# Patient Record
Sex: Male | Born: 1945 | Race: White | Hispanic: No | Marital: Single | Smoking: Current some day smoker
Health system: Southern US, Community
[De-identification: ages and names within clinical notes are randomized; demographics above are authoritative.]

## PROBLEM LIST (undated history)

## (undated) DIAGNOSIS — I10 Essential (primary) hypertension: Secondary | ICD-10-CM

---

## 1997-08-23 ENCOUNTER — Ambulatory Visit (HOSPITAL_COMMUNITY): Admission: RE | Admit: 1997-08-23 | Discharge: 1997-08-23 | Payer: Self-pay | Admitting: Family Medicine

## 1997-09-06 ENCOUNTER — Ambulatory Visit (HOSPITAL_COMMUNITY): Admission: RE | Admit: 1997-09-06 | Discharge: 1997-09-06 | Payer: Self-pay | Admitting: Family Medicine

## 1997-09-28 ENCOUNTER — Encounter: Admission: RE | Admit: 1997-09-28 | Discharge: 1997-09-28 | Payer: Self-pay | Admitting: Internal Medicine

## 2000-08-13 ENCOUNTER — Encounter: Payer: Self-pay | Admitting: Family Medicine

## 2000-08-13 ENCOUNTER — Encounter: Admission: RE | Admit: 2000-08-13 | Discharge: 2000-08-13 | Payer: Self-pay | Admitting: Family Medicine

## 2015-02-09 ENCOUNTER — Emergency Department
Admission: EM | Admit: 2015-02-09 | Discharge: 2015-02-09 | Disposition: A | Discharge status: Home / Self Care | Payer: Medicare Other | Attending: Emergency Medicine | Admitting: Emergency Medicine

## 2015-02-09 ENCOUNTER — Encounter: Payer: Self-pay | Admitting: Emergency Medicine

## 2015-02-09 ENCOUNTER — Emergency Department: Payer: Medicare Other

## 2015-02-09 DIAGNOSIS — Y998 Other external cause status: Secondary | ICD-10-CM | POA: Diagnosis not present

## 2015-02-09 DIAGNOSIS — I10 Essential (primary) hypertension: Secondary | ICD-10-CM | POA: Insufficient documentation

## 2015-02-09 DIAGNOSIS — S42301A Unspecified fracture of shaft of humerus, right arm, initial encounter for closed fracture: Secondary | ICD-10-CM

## 2015-02-09 DIAGNOSIS — S42291A Other displaced fracture of upper end of right humerus, initial encounter for closed fracture: Secondary | ICD-10-CM | POA: Diagnosis not present

## 2015-02-09 DIAGNOSIS — Y9289 Other specified places as the place of occurrence of the external cause: Secondary | ICD-10-CM | POA: Diagnosis not present

## 2015-02-09 DIAGNOSIS — Y9389 Activity, other specified: Secondary | ICD-10-CM | POA: Diagnosis not present

## 2015-02-09 DIAGNOSIS — S29001A Unspecified injury of muscle and tendon of front wall of thorax, initial encounter: Secondary | ICD-10-CM | POA: Insufficient documentation

## 2015-02-09 DIAGNOSIS — W010XXA Fall on same level from slipping, tripping and stumbling without subsequent striking against object, initial encounter: Secondary | ICD-10-CM | POA: Diagnosis not present

## 2015-02-09 DIAGNOSIS — F17200 Nicotine dependence, unspecified, uncomplicated: Secondary | ICD-10-CM | POA: Diagnosis not present

## 2015-02-09 DIAGNOSIS — S4991XA Unspecified injury of right shoulder and upper arm, initial encounter: Secondary | ICD-10-CM | POA: Diagnosis present

## 2015-02-09 DIAGNOSIS — W19XXXA Unspecified fall, initial encounter: Secondary | ICD-10-CM

## 2015-02-09 HISTORY — DX: Essential (primary) hypertension: I10

## 2015-02-09 IMAGING — CR DG SHOULDER 2+V*R*
1 series · 3 of 3 positions shown · non-contrast
Comparison: None.

CLINICAL DATA: Tripped and fell on right shoulder. Initial
encounter.

EXAM:
RIGHT SHOULDER - 2+ VIEW

[Series 1: w shoulder y-view right · 0.14mm/px · 3 of 3 slices shown]
[im 1/3]
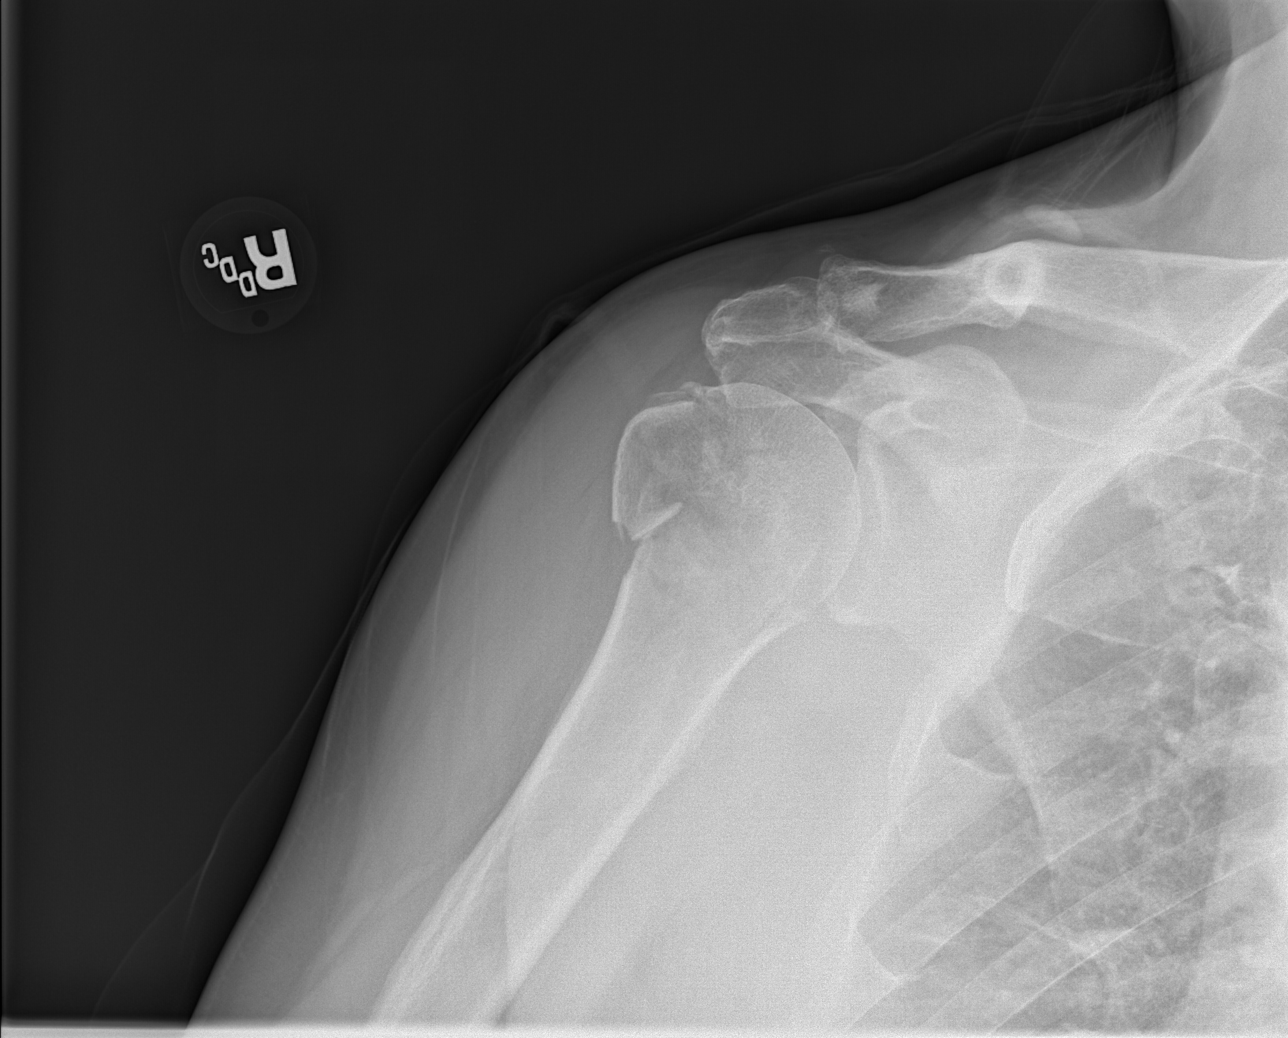
[im 2/3]
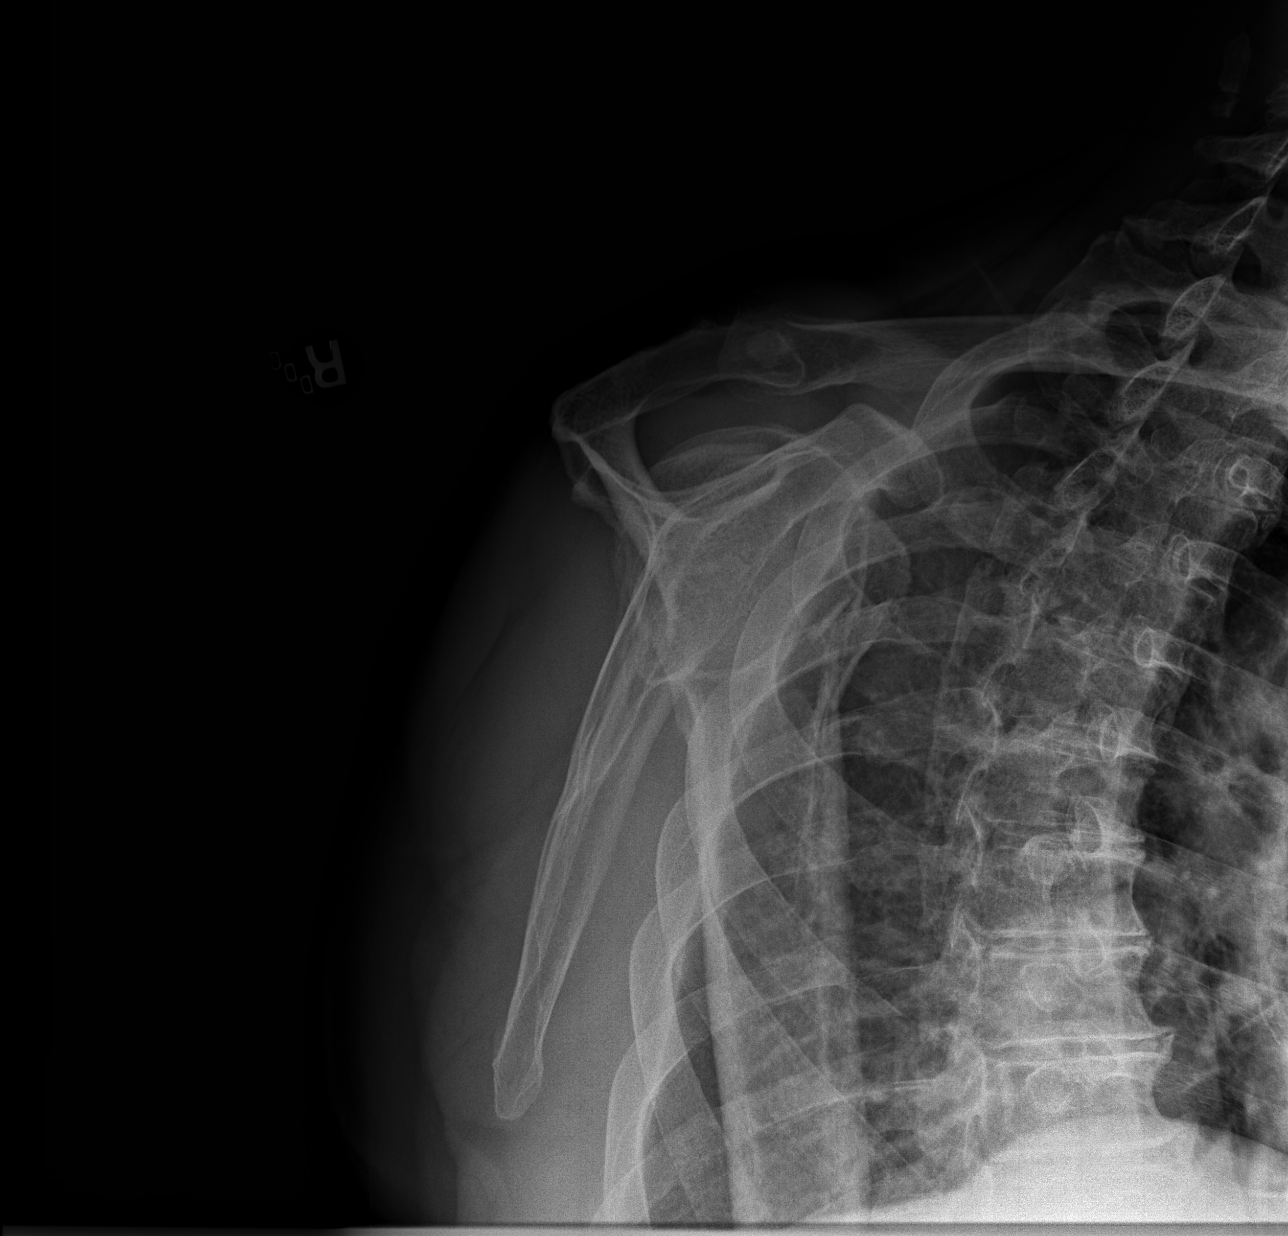
[im 3/3]
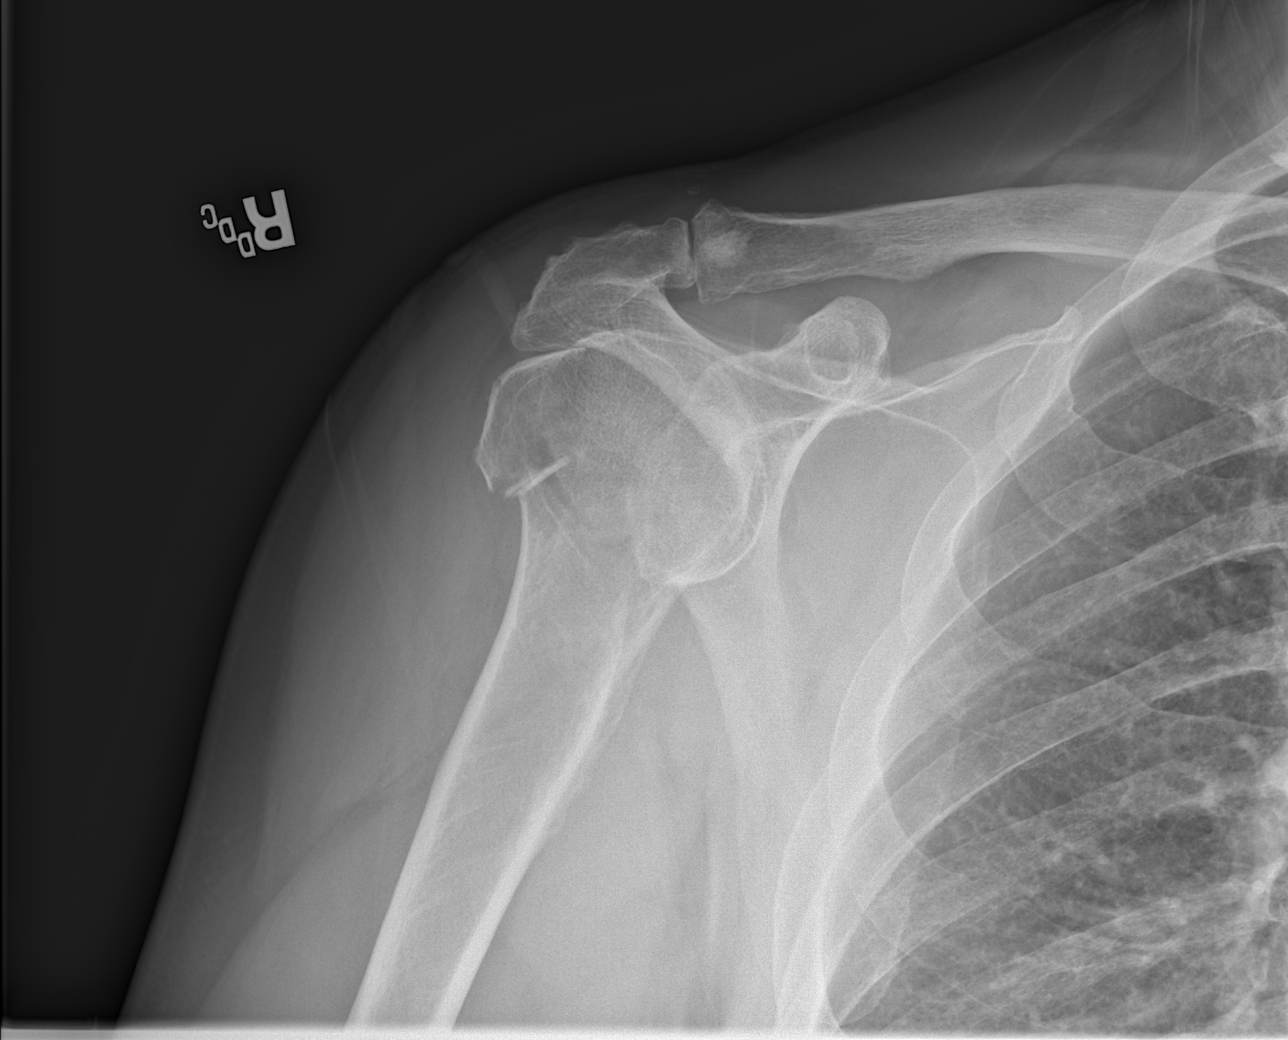

[3 of 3 positions shown; findings below may reference images not displayed]

FINDINGS: There is a comminuted, minimally displaced fracture of the proximal
humerus involving the greater tuberosity and surgical neck. The
humeral head remains approximated with the glenoid. Moderate
acromioclavicular joint osteoarthrosis is noted. No lytic or blastic
osseous lesion or focal soft tissue abnormality is seen.
IMPRESSION: Minimally displaced comminuted fracture of the proximal right
humerus.

## 2015-02-09 MED ORDER — OXYCODONE HCL 5 MG PO TABS: 5.0000 mg | ORAL_TABLET | ORAL | Status: DC | PRN

## 2015-02-09 NOTE — ED Notes (Signed)
Pt states tripped  And fell on his right shoulder with all of his body weight. Cms intact.

## 2015-02-09 NOTE — Discharge Instructions (Signed)
We looked at the x-ray together. Your fracture of part of the bone in your shoulder. Follow-up with orthopedics for reevaluation and ongoing care. Keep the sling in place area did take Tylenol for pain control. If you need stronger medicine, take the oxycodone that has been prescribed, one every 4 hours. Return the emergency department if you have worsening chest pain, shortness of breath, or a few loose sensation or strength in your right hand, or if you have other urgent concerns.  Humerus Fracture Treated With Immobilization The humerus is the large bone in the upper arm. A broken (fractured) humerus is often treated by wearing a cast, splint, or sling (immobilization). This holds the broken pieces in place so they can heal.  HOME CARE  Put ice on the injured area.  Put ice in a plastic bag.  Place a towel between your skin and the bag.  Leave the ice on for 15-20 minutes, 03-04 times a day.  If you are given a cast:  Do not scratch the skin under the cast.  Check the skin around the cast every day. You may put lotion on any red or sore areas.  Keep the cast dry and clean.  If you are given a splint:  Wear the splint as told.  Keep the splint clean and dry.  Loosen the elastic around the splint if your fingers become numb, cold, tingle, or turn blue.  If you are given a sling:  Wear the sling as told.  Do not put pressure on any part of the cast or splint until it is fully hardened.  The cast or splint must be protected with a plastic bag during bathing. Do not lower the cast or splint into water.  Only take medicine as told by your doctor.  Do exercises as told by your doctor.  Follow up as told by your doctor. GET HELP RIGHT AWAY IF:   Your skin or fingernails turn blue or gray.  Your arm feels cold or numb.  You have very bad pain in the injured arm.  You are having problems with the medicines you were given. MAKE SURE YOU:   Understand these  instructions.  Will watch your condition.  Will get help right away if you are not doing well or get worse.   This information is not intended to replace advice given to you by your health care provider. Make sure you discuss any questions you have with your health care provider.   Document Released: 07/11/2007 Document Revised: 02/12/2014 Document Reviewed: 06/16/2014 Elsevier Interactive Patient Education [employer]2016 Elsevier Inc.

## 2015-02-09 NOTE — ED Provider Notes (Signed)
[facility] Emergency Department Provider Note  ____________________________________________  Time seen: 1635  I have reviewed the triage vital signs and the nursing notes.  History by:  Patient  HISTORY  Chief Complaint Shoulder Injury  fall    HPI Warren Leblanc is a 70 y.o. male who was at a gas station, filling his deep with gas. He reports he stepped over the gas hose and tripped. He fell forward, rotating, landing on his right shoulder.  He presents emergency Department with ongoing pain to the right shoulder. He also reports pain in the right lateral chest. He denies any shortness of breath. He denies any other chest pain. He reports increased pain with his shoulder when he attempts to raise the arm.  After triage evaluation, an x-ray was ordered. I have reviewed this x-ray and it shows a fracture to the humeral head.   Past Medical History  Diagnosis Date  . Hypertension     There are no active problems to display for this patient.   History reviewed. No pertinent past surgical history.  Current Outpatient Rx  Name  Route  Sig  Dispense  Refill  . oxyCODONE (OXY IR/ROXICODONE) 5 MG immediate release tablet   Oral   Take 1 tablet (5 mg total) by mouth every 4 (four) hours as needed for severe pain.   10 tablet   0     Allergies Review of patient's allergies indicates no known allergies.  No family history on file.  Social History Social History  Substance Use Topics  . Smoking status: Current Some Day Smoker  . Smokeless tobacco: None  . Alcohol Use: No    Review of Systems  Constitutional: Negative for fever/chills. ENT: Negative for congestion. Cardiovascular: Negative for chest pain. Respiratory: Negative for cough. Gastrointestinal: Negative for abdominal pain, vomiting and diarrhea. Genitourinary: Negative for dysuria. Musculoskeletal: Pain to right shoulder. See history of present illness. Skin: Negative for  rash. Neurological: Negative for headache or focal weakness   10-point ROS otherwise negative.  ____________________________________________   PHYSICAL EXAM:  VITAL SIGNS: ED Triage Vitals  Enc Vitals Group     BP 02/09/15 1436 132/79 mmHg     Pulse Rate 02/09/15 1436 78     Resp 02/09/15 1436 20     Temp 02/09/15 1436 98.2 F (36.8 C)     Temp Source 02/09/15 1436 Oral     SpO2 02/09/15 1436 97 %     Weight 02/09/15 1435 258 lb (117.028 kg)     Height 02/09/15 1435 6' (1.829 m)     Head Cir --      Peak Flow --      Pain Score 02/09/15 1435 10     Pain Loc --      Pain Edu? --      Excl. in GC? --     Constitutional:  Alert and oriented. Well appearing and in no distress. ENT   Head: Normocephalic and atraumatic.   Cervical: Nontender. Normal range of motion Cardiovascular: Normal rate, regular rhythm, no murmur noted Chest wall : Minimal tenderness to the right lateral chest. No defect. Respiratory:  Normal respiratory effort, no tachypnea.    Breath sounds are clear and equal bilaterally.  Gastrointestinal: Soft, no distention. Nontender Back: No muscle spasm, no tenderness, no CVA tenderness. Musculoskeletal: Tenderness to the right shoulder with decreased range of motion. No obvious deformity noted. Neurologic:  Communicative. Good motor function and sensation distal in his right arm. Skin:  Skin is warm, dry. No rash noted. Psychiatric: Mood and affect are normal. Speech and behavior are normal.  ____________________________________________   RADIOLOGY  Right shoulder:  FINDINGS: There is a comminuted, minimally displaced fracture of the proximal humerus involving the greater tuberosity and surgical neck. The humeral head remains approximated with the glenoid. Moderate acromioclavicular joint osteoarthrosis is noted. No lytic or blastic osseous lesion or focal soft tissue abnormality is seen.  IMPRESSION: Minimally displaced comminuted fracture of  the proximal right humerus.   ____________________________________________   PROCEDURES    ____________________________________________   INITIAL IMPRESSION / ASSESSMENT AND PLAN / ED COURSE  Pertinent labs & imaging results that were available during my care of the patient were reviewed by me and considered in my medical decision making (see chart for details).  C7 neural male who fell, landing on his right shoulder, and sustained a fracture to the proximal humerus/humeral head. This is mildly comminuted. It does not cut across the shaft of the bone. We have placed a sling on the patient. We have offered him pain medication but he is declining this. He will accept a prescription for oxycodone to be used at home if needed. He will also use acetaminophen as needed. I've counseled him to follow-up with orthopedics (Dr. Joice Lofts on call).  ____________________________________________   FINAL CLINICAL IMPRESSION(S) / ED DIAGNOSES  Final diagnoses:  Fracture of right humerus, closed, initial encounter      Darien Ramus, MD 02/09/15 1700

## 2016-02-17 ENCOUNTER — Encounter (INDEPENDENT_AMBULATORY_CARE_PROVIDER_SITE_OTHER): Payer: Self-pay | Admitting: Vascular Surgery

## 2016-02-17 ENCOUNTER — Ambulatory Visit (INDEPENDENT_AMBULATORY_CARE_PROVIDER_SITE_OTHER): Payer: Medicare Other | Admitting: Vascular Surgery

## 2016-02-17 VITALS — BP 134/80 | HR 69 | Resp 16 | Ht 72.0 in | Wt 283.0 lb

## 2016-02-17 DIAGNOSIS — I890 Lymphedema, not elsewhere classified: Secondary | ICD-10-CM

## 2016-02-17 DIAGNOSIS — M7989 Other specified soft tissue disorders: Secondary | ICD-10-CM

## 2016-02-17 DIAGNOSIS — I10 Essential (primary) hypertension: Secondary | ICD-10-CM | POA: Diagnosis not present

## 2016-02-17 NOTE — Patient Instructions (Signed)
Lymphedema Introduction Lymphedema is swelling that is caused by the abnormal collection of lymph under the skin. Lymph is fluid from the tissues in your body that travels in the lymphatic system. This system is part of the immune system and includes lymph nodes and lymph vessels. The lymph vessels collect and carry the excess fluid, fats, proteins, and wastes from the tissues of the body to the bloodstream. This system also works to clean and remove bacteria and waste products from the body. Lymphedema occurs when the lymphatic system is blocked. When the lymph vessels or lymph nodes are blocked or damaged, lymph does not drain properly, causing an abnormal buildup of lymph. This leads to swelling in the arms or legs. Lymphedema cannot be cured by medicines, but various methods can be used to help reduce the swelling. What are the causes? There are two types of lymphedema. Primary lymphedema is caused by the absence or abnormality of the lymph vessel at birth. Secondary lymphedema is more common. It occurs when the lymph vessel is damaged or blocked. Common causes of lymph vessel blockage include:  Skin infection, such as cellulitis.  Infection by parasites (filariasis).  Injury.  Cancer.  Radiation therapy.  Formation of scar tissue.  Surgery. What are the signs or symptoms? Symptoms of this condition include:  Swelling of the arm or leg.  A heavy or tight feeling in the arm or leg.  Swelling of the feet, toes, or fingers. Shoes or rings may fit more tightly than before.  Redness of the skin over the affected area.  Limited movement of the affected limb.  Sensitivity to touch or discomfort in the affected limb. How is this diagnosed? This condition may be diagnosed with:  A physical exam.  Medical history.  Bioimpedance spectroscopy. In this test, painless electrical currents are used to measure fluid levels in your body.  Imaging tests, such as:  Lymphoscintigraphy. In  this test, a low dose of a radioactive substance is injected to trace the flow of lymph through the lymph vessels.  MRI.  CT scan.  Duplex ultrasound. This test uses sound waves to produce images of the vessels and the blood flow on a screen.  Lymphangiography. In this test, a contrast dye is injected into the lymph vessel to help show blockages. How is this treated? Treatment for this condition may depend on the cause. Treatment may include:  Exercise. Certain exercises can help fluid move out of the affected limb.  Massage. Gentle massage of the affected limb can help move the fluid out of the area.  Compression. Various methods may be used to apply pressure to the affected limb in order to reduce the swelling.  Wearing compression stockings or sleeves on the affected limb.  Bandaging the affected limb.  Using an external pump that is attached to a sleeve that alternates between applying pressure and releasing pressure.  Surgery. This is usually only done for severe cases. For example, surgery may be done if you have trouble moving the limb or if the swelling does not get better with other treatments. If an underlying condition is causing the lymphedema, treatment for that condition is needed. For example, antibiotic medicines may be used to treat an infection. Follow these instructions at home: Activities  Exercise regularly as directed by your health care provider.  Do not sit with your legs crossed.  When possible, keep the affected limb raised (elevated) above the level of your heart.  Avoid carrying things with an arm that is   affected by lymphedema.  Remember that the affected area is more likely to become injured or infected.  Take these steps to help prevent infection:  Keep the affected area clean and dry.  Protect your skin from cuts. For example, you should use gloves while cooking or gardening. Do not walk barefoot. If you shave the affected area, use an  electric razor. General instructions  Take medicines only as directed by your health care provider.  Eat a healthy diet that includes a lot of fruits and vegetables.  Do not wear tight clothes, shoes, or jewelry.  Do not use heating pads over the affected area.  Avoid having blood pressure checked on the affected limb.  Keep all follow-up visits as directed by your health care provider. This is important. Contact a health care provider if:  You continue to have swelling in your limb.  You have a fever.  You have a cut that does not heal.  You have redness or pain in the affected area.  You have new swelling in your limb that comes on suddenly.  You develop purplish spots or sores (lesions) on your limb. Get help right away if:  You have a skin rash.  You have chills or sweats.  You have shortness of breath. This information is not intended to replace advice given to you by your health care provider. Make sure you discuss any questions you have with your health care provider. Document Released: 11/19/2006 Document Revised: 09/29/2015 Document Reviewed: 12/30/2013  2017 Elsevier  

## 2016-02-17 NOTE — Assessment & Plan Note (Signed)
blood pressure control important in reducing the progression of atherosclerotic disease. On appropriate oral medications.  

## 2016-02-17 NOTE — Assessment & Plan Note (Signed)
Control was fair. Agree with weight loss and increasing exercise regimen as this should help his leg swelling. Continue compression stockings in the lymphedema pump.

## 2016-02-17 NOTE — Progress Notes (Addendum)
[name] : [id number]  Warren Leblanc is a 71 y.o. (11-09-1945) male who presents with chief complaint of  Chief Complaint  Patient presents with  . Re-evaluation    4-5 month follow up  .  History of Present Illness: Patient returns today in follow up of Lymphedema. He is doing reasonably well but is somewhat disappointed that his thigh swelling really has not gone down much. The lymphedema pump and the continuous use of compression stockings have helped his lower leg swelling but he also feels like his groin is less swollen and less fibrotic. He does the lymphedema pump an hour to 90 minutes daily. He has a plan to increase his exercise regimen and lose weight going forward as well. He does not have any signs of ulceration or infection.  Current Outpatient Prescriptions  Medication Sig Dispense Refill  . Cyanocobalamin (VITAMIN B 12 PO) Take by mouth.    Marland Kitchen lisinopril-hydrochlorothiazide (PRINZIDE,ZESTORETIC) 10-12.5 MG tablet Take 1 tablet by mouth daily.    Marland Kitchen oxyCODONE (OXY IR/ROXICODONE) 5 MG immediate release tablet Take 1 tablet (5 mg total) by mouth every 4 (four) hours as needed for severe pain. 10 tablet 0   No current facility-administered medications for this visit.     Past Medical History:  Diagnosis Date  . Hypertension   Lymphedema  No past surgical history on file.  Social History Social History  Substance Use Topics  . Smoking status: Current Some Day Smoker  . Smokeless tobacco: Not on file  . Alcohol use No  No IV drug use  Family History No bleeding or clotting disorders  No Known Allergies   REVIEW OF SYSTEMS (Negative unless checked)  Constitutional: [] Weight loss  [] Fever  [] Chills Cardiac: [] Chest pain   [] Chest pressure   [] Palpitations   [] Shortness of breath when laying flat   [] Shortness of breath at rest   [] Shortness of breath with exertion. Vascular:  [] Pain in legs with walking   [] Pain in legs at rest   [] Pain in legs when laying flat    [] Claudication   [] Pain in feet when walking  [] Pain in feet at rest  [] Pain in feet when laying flat   [] History of DVT   [] Phlebitis   [x] Swelling in legs   [] Varicose veins   [] Non-healing ulcers Pulmonary:   [] Uses home oxygen   [] Productive cough   [] Hemoptysis   [] Wheeze  [] COPD   [] Asthma Neurologic:  [] Dizziness  [] Blackouts   [] Seizures   [] History of stroke   [] History of TIA  [] Aphasia   [] Temporary blindness   [] Dysphagia   [] Weakness or numbness in arms   [] Weakness or numbness in legs Musculoskeletal:  [] Arthritis   [] Joint swelling   [x] Joint pain   [] Low back pain Hematologic:  [] Easy bruising  [] Easy bleeding   [] Hypercoagulable state   [] Anemic   Gastrointestinal:  [] Blood in stool   [] Vomiting blood  [] Gastroesophageal reflux/heartburn   [] Abdominal pain Genitourinary:  [] Chronic kidney disease   [] Difficult urination  [] Frequent urination  [] Burning with urination   [] Hematuria Skin:  [] Rashes   [] Ulcers   [] Wounds Psychological:  [] History of anxiety   []  History of major depression.  Physical Examination  BP 134/80 (BP Location: Left Arm)   Pulse 69   Resp 16   Ht 6' (1.829 m)   Wt 283 lb (128.4 kg)   BMI 38.38 kg/m  Gen:  WD/WN, NAD Head: [place]/AT, No temporalis wasting. Ear/Nose/Throat: Hearing grossly intact, nares w/o erythema  or drainage, trachea midline Eyes: Conjunctiva clear. Sclera non-icteric Neck: Supple.  No JVD.  Pulmonary:  Good air movement, no use of accessory muscles.  Cardiac: RRR, normal S1, S2 Vascular:  Vessel Right Left  Radial Palpable Palpable                                   Gastrointestinal: soft, non-tender/non-distended. No guarding/reflex.  Musculoskeletal: M/S 5/5 throughout.  No deformity or atrophy. 3+ bilateral lower extremity edema. Neurologic: Sensation grossly intact in extremities.  Symmetrical.  Speech is fluent.  Psychiatric: Judgment intact, Mood & affect appropriate for pt's clinical situation. Dermatologic: No  rashes or ulcers noted.  No cellulitis or open wounds. Lymph : No Cervical, Axillary, or Inguinal lymphadenopathy.      Labs No results found for this or any previous visit (from the past 2160 hour(s)).  Radiology No results found.   Assessment/Plan  Essential hypertension, benign blood pressure control important in reducing the progression of atherosclerotic disease. On appropriate oral medications.   Swelling of limb Control was fair. Agree with weight loss and increasing exercise regimen as this should help his leg swelling. Continue compression stockings in the lymphedema pump.  Lymphedema Overall he has had improvement with his lymphedema pump and daily use of compression stockings, but far from a cure. Agree with weight loss and increasing exercise regimen as this should help his leg swelling. Continue compression stockings and the lymphedema pump. Discussed that we could get some thigh-high compression stockings and maybe a different pneumatic device particularly if his leg swelling persists after significant weight loss.    Festus BarrenJason Shirly Bartosiewicz, MD  02/17/2016 1:04 PM    This note was created with Dragon medical transcription system.  Any errors from dictation are purely unintentional

## 2016-02-17 NOTE — Assessment & Plan Note (Signed)
Overall he has had improvement with his lymphedema pump and daily use of compression stockings, but far from a cure. Agree with weight loss and increasing exercise regimen as this should help his leg swelling. Continue compression stockings and the lymphedema pump. Discussed that we could get some thigh-high compression stockings and maybe a different pneumatic device particularly if his leg swelling persists after significant weight loss.

## 2016-04-03 DIAGNOSIS — L570 Actinic keratosis: Secondary | ICD-10-CM | POA: Insufficient documentation

## 2016-04-03 DIAGNOSIS — R635 Abnormal weight gain: Secondary | ICD-10-CM | POA: Insufficient documentation

## 2016-04-03 DIAGNOSIS — B372 Candidiasis of skin and nail: Secondary | ICD-10-CM | POA: Insufficient documentation

## 2016-05-01 ENCOUNTER — Ambulatory Visit (INDEPENDENT_AMBULATORY_CARE_PROVIDER_SITE_OTHER): Payer: Medicare Other | Admitting: Vascular Surgery

## 2016-05-01 ENCOUNTER — Encounter (INDEPENDENT_AMBULATORY_CARE_PROVIDER_SITE_OTHER): Payer: Self-pay | Admitting: Vascular Surgery

## 2016-05-01 ENCOUNTER — Encounter (INDEPENDENT_AMBULATORY_CARE_PROVIDER_SITE_OTHER): Payer: Self-pay

## 2016-05-01 VITALS — BP 141/85 | HR 76 | Resp 17 | Ht 72.0 in | Wt 295.0 lb

## 2016-05-01 DIAGNOSIS — I10 Essential (primary) hypertension: Secondary | ICD-10-CM

## 2016-05-01 DIAGNOSIS — I890 Lymphedema, not elsewhere classified: Secondary | ICD-10-CM | POA: Diagnosis not present

## 2016-05-01 DIAGNOSIS — M7989 Other specified soft tissue disorders: Secondary | ICD-10-CM | POA: Diagnosis not present

## 2016-05-01 DIAGNOSIS — L03116 Cellulitis of left lower limb: Secondary | ICD-10-CM | POA: Diagnosis not present

## 2016-05-01 NOTE — Progress Notes (Signed)
[name] : [id number]  Warren Leblanc is a 71 y.o. (05/07/1945) male who presents with chief complaint of  Chief Complaint  Patient presents with  . Re-evaluation    4 month, leg swelling  .  History of Present Illness: Patient returns today in follow up of Lymphedema before his scheduled appointment secondary to redness, increased swelling, and pain in his left lower extremity. He was started on antibiotics a couple of days ago but is now out. This has helped the erythema although not completely resolved. The pain involves the calf being tender to palpation and soreness all the way up to the groin. The right leg has chronic swelling with no exacerbation.  Current Outpatient Prescriptions  Medication Sig Dispense Refill  . ACIDOPHILUS LACTOBACILLUS PO Take by mouth.    . Ascorbic Acid (VITAMIN C) 1000 MG tablet Take by mouth.    . Biotin 2.5 MG TABS Take by mouth.    . Calcium Ascorbate 500 MG TABS Take by mouth.    . Cyanocobalamin (VITAMIN B 12 PO) Take by mouth.    . Ergocalciferol 2000 units TABS Take by mouth.    . folic acid (FOLVITE) 1 MG tablet Take by mouth.    . furosemide (LASIX) 40 MG tablet Take by mouth.    Marland Kitchen. lisinopril-hydrochlorothiazide (PRINZIDE,ZESTORETIC) 10-12.5 MG tablet Take 1 tablet by mouth daily.    . Magnesium 100 MG CAPS Take by mouth.    . oxyCODONE (OXY IR/ROXICODONE) 5 MG immediate release tablet Take 1 tablet (5 mg total) by mouth every 4 (four) hours as needed for severe pain. 10 tablet 0  . Potassium 99 MG TABS Take by mouth.    . Taurine 1000 MG CAPS Take by mouth.    . TURMERIC PO Take by mouth.     No current facility-administered medications for this visit.     Past Medical History:  Diagnosis Date  . Hypertension     No past surgical history on file.  Social History Social History  Substance Use Topics  . Smoking status: Current Some Day Smoker  . Smokeless tobacco: Never Used  . Alcohol use No     Family History No bleeding or  clotting disorders  Allergies  Allergen Reactions  . Antihistamines, Chlorpheniramine-Type Nausea Only     REVIEW OF SYSTEMS (Negative unless checked)  Constitutional: [] Weight loss  [] Fever  [x] Chills Cardiac: [] Chest pain   [] Chest pressure   [] Palpitations   [] Shortness of breath when laying flat   [] Shortness of breath at rest   [] Shortness of breath with exertion. Vascular:  [x] Pain in legs with walking   [] Pain in legs at rest   [] Pain in legs when laying flat   [] Claudication   [] Pain in feet when walking  [] Pain in feet at rest  [] Pain in feet when laying flat   [] History of DVT   [] Phlebitis   [x] Swelling in legs   [] Varicose veins   [] Non-healing ulcers Pulmonary:   [] Uses home oxygen   [] Productive cough   [] Hemoptysis   [] Wheeze  [] COPD   [] Asthma Neurologic:  [] Dizziness  [] Blackouts   [] Seizures   [] History of stroke   [] History of TIA  [] Aphasia   [] Temporary blindness   [] Dysphagia   [] Weakness or numbness in arms   [] Weakness or numbness in legs Musculoskeletal:  [] Arthritis   [] Joint swelling   [] Joint pain   [] Low back pain Hematologic:  [] Easy bruising  [] Easy bleeding   [] Hypercoagulable state   []   Anemic   Gastrointestinal:  [] Blood in stool   [] Vomiting blood  [] Gastroesophageal reflux/heartburn   [] Abdominal pain Genitourinary:  [] Chronic kidney disease   [] Difficult urination  [] Frequent urination  [] Burning with urination   [] Hematuria Skin:  [] Rashes   [] Ulcers   [] Wounds Psychological:  [] History of anxiety   []  History of major depression.  Physical Examination  BP (!) 141/85 (BP Location: Left Arm)   Pulse 76   Resp 17   Ht 6' (1.829 m)   Wt 295 lb (133.8 kg)   BMI 40.01 kg/m  Gen:  WD/WN, NAD Head: [place]/AT, No temporalis wasting. Ear/Nose/Throat: Hearing grossly intact, nares w/o erythema or drainage, trachea midline Eyes: Conjunctiva clear. Sclera non-icteric Neck: Supple.  No JVD.  Pulmonary:  Good air movement, no use of accessory muscles.  Cardiac:  RRR, normal S1, S2 Vascular:  Vessel Right Left  Radial Palpable Palpable                                   Gastrointestinal: soft, non-tender/non-distended. No guarding/reflex.  Musculoskeletal: M/S 5/5 throughout.  No deformity or atrophy. 3+ right lower extremity edema, 4+ left lower extremity edema. Neurologic: Sensation grossly intact in extremities.  Symmetrical.  Speech is fluent.  Psychiatric: Judgment intact, Mood & affect appropriate for pt's clinical situation. Dermatologic: No rashes or ulcers noted.  Erythema is present in the left calf area Lymph : No Cervical, Axillary, or Inguinal lymphadenopathy.      Labs No results found for this or any previous visit (from the past 2160 hour(s)).  Radiology No results found.    Assessment/Plan  Essential hypertension, benign blood pressure control important in reducing the progression of atherosclerotic disease. On appropriate oral medications.   Swelling of limb Worse with the cellulitis.  Discussed again typical management and he has weight loss planned.  Lymphedema Worse with recent cellulitis.  Abx Rx and follow up in 2 months  Cellulitis of leg, left This is a recurring problem, and I have given him a prescription for amoxicillin today. Continue on his known conservative therapies. Follow-up in 2 months.    Festus Barren, MD  05/01/2016 3:19 PM    This note was created with Dragon medical transcription system.  Any errors from dictation are purely unintentional

## 2016-05-01 NOTE — Assessment & Plan Note (Signed)
Worse with the cellulitis.  Discussed again typical management and he has weight loss planned.

## 2016-05-01 NOTE — Assessment & Plan Note (Signed)
Worse with recent cellulitis.  Abx Rx and follow up in 2 months

## 2016-05-01 NOTE — Assessment & Plan Note (Signed)
This is a recurring problem, and I have given him a prescription for amoxicillin today. Continue on his known conservative therapies. Follow-up in 2 months.

## 2016-05-01 NOTE — Assessment & Plan Note (Signed)
blood pressure control important in reducing the progression of atherosclerotic disease. On appropriate oral medications.  

## 2016-06-26 ENCOUNTER — Encounter (INDEPENDENT_AMBULATORY_CARE_PROVIDER_SITE_OTHER): Payer: Self-pay | Admitting: Vascular Surgery

## 2016-06-26 ENCOUNTER — Ambulatory Visit (INDEPENDENT_AMBULATORY_CARE_PROVIDER_SITE_OTHER): Payer: Medicare Other | Admitting: Vascular Surgery

## 2016-06-26 VITALS — BP 127/82 | HR 59 | Resp 16 | Ht 72.0 in | Wt 272.0 lb

## 2016-06-26 DIAGNOSIS — I10 Essential (primary) hypertension: Secondary | ICD-10-CM | POA: Diagnosis not present

## 2016-06-26 DIAGNOSIS — I890 Lymphedema, not elsewhere classified: Secondary | ICD-10-CM

## 2016-06-26 DIAGNOSIS — M7989 Other specified soft tissue disorders: Secondary | ICD-10-CM

## 2016-06-26 NOTE — Assessment & Plan Note (Signed)
Improved with weight loss. Continue compression stockings, elevation, lymphedema pump, and exercise. Plan to recheck in about 4 months.

## 2016-06-26 NOTE — Progress Notes (Signed)
[name] : [id number]  Warren Leblanc is a 71 y.o. (11/18/1945) male who presents with chief complaint of  Chief Complaint  Patient presents with  . Re-evaluation    2 month follow up no studies  .  History of Present Illness: Patient returns today in follow up of Lymphedema. He has lost 21 pounds and his legs do look better. He still has prominent swelling and he understands this is unlikely to be completely resolved no matter how much weight he loses but is pleased with the initial results. He had cellulitis several months ago, but currently he has no symptoms or signs of cellulitis.         Current Outpatient Prescriptions  Medication Sig Dispense Refill  . ACIDOPHILUS LACTOBACILLUS PO Take by mouth.    . Ascorbic Acid (VITAMIN C) 1000 MG tablet Take by mouth.    . Biotin 2.5 MG TABS Take by mouth.    . Calcium Ascorbate 500 MG TABS Take by mouth.    . Cyanocobalamin (VITAMIN B 12 PO) Take by mouth.    . Ergocalciferol 2000 units TABS Take by mouth.    . folic acid (FOLVITE) 1 MG tablet Take by mouth.    . furosemide (LASIX) 40 MG tablet Take by mouth.    Marland Kitchen. lisinopril-hydrochlorothiazide (PRINZIDE,ZESTORETIC) 10-12.5 MG tablet Take 1 tablet by mouth daily.    . Magnesium 100 MG CAPS Take by mouth.    . oxyCODONE (OXY IR/ROXICODONE) 5 MG immediate release tablet Take 1 tablet (5 mg total) by mouth every 4 (four) hours as needed for severe pain. 10 tablet 0  . Potassium 99 MG TABS Take by mouth.    . Taurine 1000 MG CAPS Take by mouth.    . TURMERIC PO Take by mouth.     No current facility-administered medications for this visit.         Past Medical History:  Diagnosis Date  . Hypertension     No past surgical history on file.  Social History     Social History  Substance Use Topics  . Smoking status: Current Some Day Smoker  . Smokeless tobacco: Never Used  . Alcohol use No     Family History No bleeding or clotting  disorders      Allergies  Allergen Reactions  . Antihistamines, Chlorpheniramine-Type Nausea Only     REVIEW OF SYSTEMS (Negative unless checked)  Constitutional: [] Weight loss  [] Fever  [x] Chills Cardiac: [] Chest pain   [] Chest pressure   [] Palpitations   [] Shortness of breath when laying flat   [] Shortness of breath at rest   [] Shortness of breath with exertion. Vascular:  [x] Pain in legs with walking   [] Pain in legs at rest   [] Pain in legs when laying flat   [] Claudication   [] Pain in feet when walking  [] Pain in feet at rest  [] Pain in feet when laying flat   [] History of DVT   [] Phlebitis   [x] Swelling in legs   [] Varicose veins   [] Non-healing ulcers Pulmonary:   [] Uses home oxygen   [] Productive cough   [] Hemoptysis   [] Wheeze  [] COPD   [] Asthma Neurologic:  [] Dizziness  [] Blackouts   [] Seizures   [] History of stroke   [] History of TIA  [] Aphasia   [] Temporary blindness   [] Dysphagia   [] Weakness or numbness in arms   [] Weakness or numbness in legs Musculoskeletal:  [] Arthritis   [] Joint swelling   [] Joint pain   [] Low back pain Hematologic:  []   Easy bruising  [] Easy bleeding   [] Hypercoagulable state   [] Anemic   Gastrointestinal:  [] Blood in stool   [] Vomiting blood  [] Gastroesophageal reflux/heartburn   [] Abdominal pain Genitourinary:  [] Chronic kidney disease   [] Difficult urination  [] Frequent urination  [] Burning with urination   [] Hematuria Skin:  [] Rashes   [] Ulcers   [] Wounds Psychological:  [] History of anxiety   []  History of major depression.    Physical Examination  BP 127/82 (BP Location: Left Arm)   Pulse (!) 59   Resp 16   Ht 6' (1.829 m)   Wt 272 lb (123.4 kg)   BMI 36.89 kg/m  Gen:  WD/WN, NAD Head: [place]/AT, No temporalis wasting. Ear/Nose/Throat: Hearing grossly intact, nares w/o erythema or drainage, trachea midline Eyes: Conjunctiva clear. Sclera non-icteric Neck: Supple.  No JVD.  Pulmonary:  Good air movement, no use of accessory muscles.    Cardiac: RRR, normal S1, S2 Vascular:  Vessel Right Left  Radial Palpable Palpable                                   Gastrointestinal: soft, non-tender/non-distended.  Musculoskeletal: M/S 5/5 throughout.  No deformity or atrophy. 2-3+ RLE edema, 3+ LLE edema. Neurologic: Sensation grossly intact in extremities.  Symmetrical.  Speech is fluent.  Psychiatric: Judgment intact, Mood & affect appropriate for pt's clinical situation. Dermatologic: No rashes or ulcers noted.  No cellulitis or open wounds.       Labs No results found for this or any previous visit (from the past 2160 hour(s)).  Radiology No results found.   Assessment/Plan  Essential hypertension, benign blood pressure control important in reducing the progression of atherosclerotic disease. On appropriate oral medications.   Swelling of limb Worse with the cellulitis but better with recent weight loss. Improved some today.  Discussed again typical management and he has more weight loss planned.  Lymphedema Improved with weight loss. Continue compression stockings, elevation, lymphedema pump, and exercise. Plan to recheck in about 4 months.    Festus Barren, MD  06/26/2016 12:41 PM    This note was created with Dragon medical transcription system.  Any errors from dictation are purely unintentional

## 2016-10-12 ENCOUNTER — Ambulatory Visit (INDEPENDENT_AMBULATORY_CARE_PROVIDER_SITE_OTHER): Payer: Medicare Other | Admitting: Vascular Surgery

## 2016-10-12 ENCOUNTER — Encounter (INDEPENDENT_AMBULATORY_CARE_PROVIDER_SITE_OTHER): Payer: Self-pay | Admitting: Vascular Surgery

## 2016-10-12 VITALS — BP 116/77 | HR 66 | Resp 17 | Wt 250.0 lb

## 2016-10-12 DIAGNOSIS — M7989 Other specified soft tissue disorders: Secondary | ICD-10-CM

## 2016-10-12 DIAGNOSIS — I890 Lymphedema, not elsewhere classified: Secondary | ICD-10-CM

## 2016-10-12 DIAGNOSIS — I10 Essential (primary) hypertension: Secondary | ICD-10-CM

## 2016-10-12 NOTE — Patient Instructions (Signed)

## 2016-10-12 NOTE — Progress Notes (Signed)
[name] : [id number]  Warren Leblanc is a 71 y.o. (June 05, 1945) male who presents with chief complaint of  Chief Complaint  Patient presents with  . Follow-up    4 month   .  History of Present Illness: Patient returns today in follow up of Lymphedema. He has lost another 20 pounds since his last visit and looks well today. His right leg is noticeably smaller. His left leg in the calf remains quite large and has really not decreased in size. Both thighs are much less tight and swollen. No new ulceration or infection.        Current Outpatient Prescriptions  Medication Sig Dispense Refill  . ACIDOPHILUS LACTOBACILLUS PO Take by mouth.    . Ascorbic Acid (VITAMIN C) 1000 MG tablet Take by mouth.    . Biotin 2.5 MG TABS Take by mouth.    . Calcium Ascorbate 500 MG TABS Take by mouth.    . Cyanocobalamin (VITAMIN B 12 PO) Take by mouth.    . Ergocalciferol 2000 units TABS Take by mouth.    . folic acid (FOLVITE) 1 MG tablet Take by mouth.    . furosemide (LASIX) 40 MG tablet Take by mouth.    Marland Kitchen lisinopril-hydrochlorothiazide (PRINZIDE,ZESTORETIC) 10-12.5 MG tablet Take 1 tablet by mouth daily.    . Magnesium 100 MG CAPS Take by mouth.    . oxyCODONE (OXY IR/ROXICODONE) 5 MG immediate release tablet Take 1 tablet (5 mg total) by mouth every 4 (four) hours as needed for severe pain. 10 tablet 0  . Potassium 99 MG TABS Take by mouth.    . Taurine 1000 MG CAPS Take by mouth.    . TURMERIC PO Take by mouth.     No current facility-administered medications for this visit.         Past Medical History:  Diagnosis Date  . Hypertension     No past surgical history on file.  Social History     Social History  Substance Use Topics  . Smoking status: Current Some Day Smoker  . Smokeless tobacco: Never Used  . Alcohol use No     Family History No bleeding or clotting disorders      Allergies  Allergen Reactions  .  Antihistamines, Chlorpheniramine-Type Nausea Only     REVIEW OF SYSTEMS(Negative unless checked)  Constitutional: Weight loss[] Fever[x] Chills Cardiac:[] Chest pain[] Chest pressure[] Palpitations Shortness of breath when laying flat Shortness of breath at rest Shortness of breath with exertion. Vascular: Pain in legs with walking[] Pain in legsat rest[] Pain in legs when laying flat Claudication Pain in feet when walking Pain in feet at rest Pain in feet when laying flat History of DVT Phlebitis Swelling in legs Varicose veins Non-healing ulcers Pulmonary: Uses home oxygen Productive cough[] Hemoptysis Wheeze COPD Asthma Neurologic: Dizziness Blackouts Seizures History of stroke History of TIA[] Aphasia Temporary blindness[] Dysphagia Weaknessor numbness in arms Weakness or numbnessin legs Musculoskeletal: Arthritis Joint swelling Joint pain Low back pain Hematologic:[] Easy bruising[] Easy bleeding Hypercoagulable state Anemic  Gastrointestinal:[] Blood in stool[] Vomiting blood[] Gastroesophageal reflux/heartburn[] Abdominal pain Genitourinary: Chronic kidney disease Difficulturination Frequenturination Burning with urination[] Hematuria Skin: Rashes Ulcers Wounds Psychological: History of anxiety[] History of major depression.   Physical Examination  BP 116/77   Pulse 66   Resp 17   Wt 113.4 kg (250 lb)   BMI 33.91 kg/m  Gen:  WD/WN, NAD Head: [place]/AT, No temporalis wasting. Ear/Nose/Throat: Hearing grossly intact, nares w/o erythema or drainage, trachea midline Eyes: Conjunctiva clear. Sclera non-icteric Neck: Supple.  No JVD.  Pulmonary:  Good  air movement, no use of accessory muscles.  Cardiac: RRR, normal S1, S2 Vascular:  Vessel Right Left  Radial Palpable Palpable                                      Musculoskeletal: M/S 5/5 throughout.  No deformity or atrophy. 1-2+ right lower extremity edema, 3+ left lower extremity edema. Thighs are significantly less swollen bilaterally. Neurologic: Sensation grossly intact in extremities.  Symmetrical.  Speech is fluent.  Psychiatric: Judgment intact, Mood & affect appropriate for pt's clinical situation. Dermatologic: No rashes or ulcers noted.  No cellulitis or open wounds.       Labs No results found for this or any previous visit (from the past 2160 hour(s)).  Radiology No results found.   Assessment/Plan  Essential hypertension, benign blood pressure control important in reducing the progression of atherosclerotic disease. On appropriate oral medications.   Swelling of limb Worse with the cellulitis but better with recent weight loss. Improved some today. Discussed again typical management and he has more weight loss planned.  Lymphedema Improved with weight loss. Continue compression stockings, elevation, lymphedema pump, and exercise. Plan to recheck in about 3-4 months. We have offered him Unna boots to try to get his left calf swelling under better control but he wants to try some copper socks which are reasonable.  No problem-specific Assessment & Plan notes found for this encounter.    Festus BarrenJason Dew, MD  10/12/2016 10:28 AM    This note was created with Dragon medical transcription system.  Any errors from dictation are purely unintentional

## 2017-01-18 ENCOUNTER — Ambulatory Visit (INDEPENDENT_AMBULATORY_CARE_PROVIDER_SITE_OTHER): Payer: Medicare Other | Admitting: Vascular Surgery

## 2017-02-12 ENCOUNTER — Ambulatory Visit (INDEPENDENT_AMBULATORY_CARE_PROVIDER_SITE_OTHER): Payer: Medicare Other | Admitting: Vascular Surgery

## 2017-02-12 ENCOUNTER — Encounter (INDEPENDENT_AMBULATORY_CARE_PROVIDER_SITE_OTHER): Payer: Self-pay | Admitting: Vascular Surgery

## 2017-02-12 VITALS — BP 119/78 | HR 66 | Resp 15 | Ht 71.0 in | Wt 254.0 lb

## 2017-02-12 DIAGNOSIS — M7989 Other specified soft tissue disorders: Secondary | ICD-10-CM | POA: Diagnosis not present

## 2017-02-12 DIAGNOSIS — I10 Essential (primary) hypertension: Secondary | ICD-10-CM

## 2017-02-12 DIAGNOSIS — I890 Lymphedema, not elsewhere classified: Secondary | ICD-10-CM | POA: Diagnosis not present

## 2017-02-12 NOTE — Assessment & Plan Note (Signed)
The patient has pretty severe lymphedema but he is managed well.  He has gotten it into reasonable control.  We again discussed that I do not have any faith in some of the surgical procedures for lymphedema and would continue conservative therapies likely for the rest of his life.  He will return in several months in follow-up.

## 2017-02-12 NOTE — Progress Notes (Signed)
[name] : [id number]  Warren Leblanc is a 72 y.o. (1945/07/11) male who presents with chief complaint of  Chief Complaint  Patient presents with  . Follow-up    3-4 month f/u  .  History of Present Illness: Patient returns today in follow up of lymphedema.  His thigh swelling is markedly improved and his lower leg swelling is a little bit better on both sides.  His left leg remains his more severely affected leg.  He reports no new ulceration or infection.  He is wearing compression garments each day and using the lymphedema pump daily as well.  Current Outpatient Prescriptions  Medication Sig Dispense Refill  . ACIDOPHILUS LACTOBACILLUS PO Take by mouth.    . Ascorbic Acid (VITAMIN C) 1000 MG tablet Take by mouth.    . Biotin 2.5 MG TABS Take by mouth.    . Calcium Ascorbate 500 MG TABS Take by mouth.    . Cyanocobalamin (VITAMIN B 12 PO) Take by mouth.    . Ergocalciferol 2000 units TABS Take by mouth.    . folic acid (FOLVITE) 1 MG tablet Take by mouth.    . furosemide (LASIX) 40 MG tablet Take by mouth.    Marland Kitchen lisinopril-hydrochlorothiazide (PRINZIDE,ZESTORETIC) 10-12.5 MG tablet Take 1 tablet by mouth daily.    . Magnesium 100 MG CAPS Take by mouth.    . oxyCODONE (OXY IR/ROXICODONE) 5 MG immediate release tablet Take 1 tablet (5 mg total) by mouth every 4 (four) hours as needed for severe pain. 10 tablet 0  . Potassium 99 MG TABS Take by mouth.    . Taurine 1000 MG CAPS Take by mouth.    . TURMERIC PO Take by mouth.     No current facility-administered medications for this visit.         Past Medical History:  Diagnosis Date  . Hypertension     No past surgical history on file.  Social History     Social History  Substance Use Topics  . Smoking status: Current Some Day Smoker  . Smokeless tobacco: Never Used  . Alcohol use No     Family History No bleeding or clotting disorders      Allergies  Allergen Reactions    . Antihistamines, Chlorpheniramine-Type Nausea Only     REVIEW OF SYSTEMS(Negative unless checked)  Constitutional: [] Weight loss[] Fever[x] Chills Cardiac:[] Chest pain[] Chest pressure[] Palpitations [] Shortness of breath when laying flat [] Shortness of breath at rest [] Shortness of breath with exertion. Vascular: [x] Pain in legs with walking[] Pain in legsat rest[] Pain in legs when laying flat [] Claudication [] Pain in feet when walking [] Pain in feet at rest [] Pain in feet when laying flat [] History of DVT [] Phlebitis [x] Swelling in legs [] Varicose veins [] Non-healing ulcers Pulmonary: [] Uses home oxygen [] Productive cough[] Hemoptysis [] Wheeze [] COPD [] Asthma Neurologic: [] Dizziness [] Blackouts [] Seizures [] History of stroke [] History of TIA[] Aphasia [] Temporary blindness[] Dysphagia [] Weaknessor numbness in arms [] Weakness or numbnessin legs Musculoskeletal: [x] Arthritis [] Joint swelling [] Joint pain [] Low back pain Hematologic:[] Easy bruising[] Easy bleeding [] Hypercoagulable state [] Anemic  Gastrointestinal:[] Blood in stool[] Vomiting blood[] Gastroesophageal reflux/heartburn[] Abdominal pain Genitourinary: [] Chronic kidney disease [] Difficulturination [] Frequenturination [] Burning with urination[] Hematuria Skin: [] Rashes [] Ulcers [] Wounds Psychological: [] History of anxiety[] History of major depression.      Physical Examination  BP 119/78 (BP Location: Left Arm, Patient Position: Sitting)   Pulse 66   Resp 15   Ht 5\' 11"  (1.803 m)   Wt 115.2 kg (254 lb)   BMI 35.43 kg/m  Gen:  WD/WN, NAD Head: [place]/AT, No temporalis wasting. Ear/Nose/Throat: Hearing grossly intact, nares w/o erythema or  drainage, trachea midline Eyes: Conjunctiva clear. Sclera non-icteric Neck: Supple.  No JVD.  Pulmonary:  Good air movement, no use of accessory muscles.  Cardiac: RRR, no  JVD Vascular:  Vessel Right Left  Radial Palpable Palpable                                    Musculoskeletal: M/S 5/5 throughout.  No deformity or atrophy.  1-2+ right lower extremity edema, 2-3+ left lower extremity edema. Neurologic: Sensation grossly intact in extremities.  Symmetrical.  Speech is fluent.  Psychiatric: Judgment intact, Mood & affect appropriate for pt's clinical situation. Dermatologic: No rashes or ulcers noted.  No cellulitis or open wounds.       Labs No results found for this or any previous visit (from the past 2160 hour(s)).  Radiology No results found.   Assessment/Plan  Essential hypertension, benign blood pressure control important in reducing the progression of atherosclerotic disease. On appropriate oral medications.   Swelling of limb Worse with the cellulitis but better with recent weight loss. Improved some today. Discussed again typical management and he hasmoreweight loss planned.   Lymphedema The patient has pretty severe lymphedema but he is managed well.  He has gotten it into reasonable control.  We again discussed that I do not have any faith in some of the surgical procedures for lymphedema and would continue conservative therapies likely for the rest of his life.  He will return in several months in follow-up.    Festus BarrenJason Sharene Krikorian, MD  02/12/2017 11:28 AM    This note was created with Dragon medical transcription system.  Any errors from dictation are purely unintentional

## 2017-05-28 ENCOUNTER — Encounter (INDEPENDENT_AMBULATORY_CARE_PROVIDER_SITE_OTHER): Payer: Self-pay | Admitting: Vascular Surgery

## 2017-05-28 ENCOUNTER — Ambulatory Visit (INDEPENDENT_AMBULATORY_CARE_PROVIDER_SITE_OTHER): Payer: Medicare Other | Admitting: Vascular Surgery

## 2017-05-28 VITALS — BP 128/82 | HR 60 | Resp 17 | Ht 72.0 in | Wt 266.4 lb

## 2017-05-28 DIAGNOSIS — M7989 Other specified soft tissue disorders: Secondary | ICD-10-CM

## 2017-05-28 DIAGNOSIS — I10 Essential (primary) hypertension: Secondary | ICD-10-CM

## 2017-05-28 DIAGNOSIS — I890 Lymphedema, not elsewhere classified: Secondary | ICD-10-CM | POA: Diagnosis not present

## 2017-05-28 NOTE — Progress Notes (Signed)
[name] : [id number]  Warren Leblanc is a 72 y.o. (10/20/1945) male who presents with chief complaint of  Chief Complaint  Patient presents with  . Follow-up    3-4 month follow up  .  History of Present Illness: Patient returns today in follow up of his lymphedema.  Overall, his lymphedema is reasonably stable with moderate to severe swelling in the right leg and the left leg with left leg being a little bit worse.  Is now developed what sounds like some arthritic pain in his right hip which has become very disabling.  He had an infection treated with antibiotics several weeks ago.  He has no current signs of infection.  No ulceration.  No fever or chills.  He continues to use his lymphedema pump and wear his compression stockings every day.  Current Outpatient Medications  Medication Sig Dispense Refill  . ACIDOPHILUS LACTOBACILLUS PO Take by mouth.    . Ascorbic Acid (VITAMIN C) 1000 MG tablet Take by mouth.    . Biotin 2.5 MG TABS Take by mouth.    . Calcium Ascorbate 500 MG TABS Take by mouth.    . Cyanocobalamin (VITAMIN B 12 PO) Take by mouth.    . Ergocalciferol 2000 units TABS Take by mouth.    . folic acid (FOLVITE) 1 MG tablet Take by mouth.    . furosemide (LASIX) 40 MG tablet Take by mouth.    Marland Kitchen. lisinopril-hydrochlorothiazide (PRINZIDE,ZESTORETIC) 10-12.5 MG tablet Take 1 tablet by mouth daily.    . Magnesium 100 MG CAPS Take by mouth.    . Potassium 99 MG TABS Take by mouth.    . Taurine 1000 MG CAPS Take by mouth.    . TURMERIC PO Take by mouth.     No current facility-administered medications for this visit.     Past Medical History:  Diagnosis Date  . Hypertension     No past surgical history on file.   Social History     Social History  Substance Use Topics  . Smoking status: Current Some Day Smoker  . Smokeless tobacco: Never Used  . Alcohol use No     Family History No bleeding or clotting disorders      Allergies  Allergen  Reactions  . Antihistamines, Chlorpheniramine-Type Nausea Only     REVIEW OF SYSTEMS(Negative unless checked)  Constitutional: [] Weight loss[] Fever[x] Chills Cardiac:[] Chest pain[] Chest pressure[] Palpitations [] Shortness of breath when laying flat [] Shortness of breath at rest [] Shortness of breath with exertion. Vascular: [x] Pain in legs with walking[] Pain in legsat rest[] Pain in legs when laying flat [] Claudication [] Pain in feet when walking [] Pain in feet at rest [] Pain in feet when laying flat [] History of DVT [] Phlebitis [x] Swelling in legs [] Varicose veins [] Non-healing ulcers Pulmonary: [] Uses home oxygen [] Productive cough[] Hemoptysis [] Wheeze [] COPD [] Asthma Neurologic: [] Dizziness [] Blackouts [] Seizures [] History of stroke [] History of TIA[] Aphasia [] Temporary blindness[] Dysphagia [] Weaknessor numbness in arms [] Weakness or numbnessin legs Musculoskeletal: [x] Arthritis [] Joint swelling [] Joint pain [] Low back pain Hematologic:[] Easy bruising[] Easy bleeding [] Hypercoagulable state [] Anemic  Gastrointestinal:[] Blood in stool[] Vomiting blood[] Gastroesophageal reflux/heartburn[] Abdominal pain Genitourinary: [] Chronic kidney disease [] Difficulturination [] Frequenturination [] Burning with urination[] Hematuria Skin: [] Rashes [] Ulcers [] Wounds Psychological: [] History of anxiety[] History of major depression.   Physical Examination  BP 128/82 (BP Location: Left Arm)   Pulse 60   Resp 17   Ht 6' (1.829 m)   Wt 120.8 kg (266 lb 6.4 oz)   BMI 36.13 kg/m  Gen:  WD/WN, NAD Head: [place]/AT, No temporalis wasting. Ear/Nose/Throat: Hearing grossly intact, nares w/o erythema or drainage Eyes:  Conjunctiva clear. Sclera non-icteric Neck: Supple.  Trachea midline Pulmonary:  Good air movement, no use of accessory muscles.  Cardiac: RRR, no JVD Vascular:  Vessel Right  Left  Radial Palpable Palpable                          PT  not palpable  not palpable  DP   1+ palpable  not palpable    Musculoskeletal: M/S 5/5 throughout.  No deformity or atrophy.  2+ right lower extremity edema, 2-3+ left lower extremity edema. Neurologic: Sensation grossly intact in extremities.  Symmetrical.  Speech is fluent.  Psychiatric: Judgment intact, Mood & affect appropriate for pt's clinical situation. Dermatologic: No rashes or ulcers noted.  No cellulitis or open wounds.       Labs No results found for this or any previous visit (from the past 2160 hour(s)).  Radiology No results found.  Assessment/Plan Essential hypertension, benign blood pressure control important in reducing the progression of atherosclerotic disease. On appropriate oral medications.   Swelling of limb Worse with the cellulitis but better with recent weight loss. Improved some today. Discussed again typical management and he hasmoreweight loss planned.   Lymphedema The patient has pretty severe lymphedema but he is managed well.  He has gotten it into reasonable control.  We again discussed that I do not have any faith in some of the surgical procedures for lymphedema and would continue conservative therapies likely for the rest of his life.  He will return in several months in follow-up.       Festus Barren, MD  05/28/2017 11:38 AM    This note was created with Dragon medical transcription system.  Any errors from dictation are purely unintentional

## 2017-05-28 NOTE — Patient Instructions (Signed)

## 2017-10-15 ENCOUNTER — Ambulatory Visit (INDEPENDENT_AMBULATORY_CARE_PROVIDER_SITE_OTHER): Payer: Medicare Other | Admitting: Vascular Surgery

## 2017-10-15 ENCOUNTER — Encounter (INDEPENDENT_AMBULATORY_CARE_PROVIDER_SITE_OTHER): Payer: Self-pay | Admitting: Vascular Surgery

## 2017-10-15 VITALS — BP 124/79 | HR 66 | Resp 15 | Ht 72.0 in | Wt 271.0 lb

## 2017-10-15 DIAGNOSIS — I10 Essential (primary) hypertension: Secondary | ICD-10-CM

## 2017-10-15 DIAGNOSIS — M79604 Pain in right leg: Secondary | ICD-10-CM

## 2017-10-15 DIAGNOSIS — I890 Lymphedema, not elsewhere classified: Secondary | ICD-10-CM

## 2017-10-15 DIAGNOSIS — M79605 Pain in left leg: Secondary | ICD-10-CM

## 2017-10-15 DIAGNOSIS — M79609 Pain in unspecified limb: Secondary | ICD-10-CM | POA: Insufficient documentation

## 2017-10-15 DIAGNOSIS — M7989 Other specified soft tissue disorders: Secondary | ICD-10-CM

## 2017-10-15 NOTE — Assessment & Plan Note (Signed)
Assessment for PAD is reasonable as he does have atherosclerotic risk factors and his clinical exam is difficult to discern due to his large amount of leg swelling.

## 2017-10-15 NOTE — Assessment & Plan Note (Signed)
Continue with compression, elevation, and the lymphedema pump.  Symptoms have somewhat plateaued.  I would hope that after the heat of the summer his swelling may go down a little bit in the fall.  Recheck in 4 to 6 months.

## 2017-10-15 NOTE — Progress Notes (Signed)
[name] : [id number]  Warren Leblanc is a 72 y.o. (September 02, 1945) male who presents with chief complaint of  Chief Complaint  Patient presents with  . Follow-up    4-6 month no studies  .  History of Present Illness: Patient returns today in follow up of his lymphedema.  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  He has been reading our flyer on PAD and was very concerned he may have that as well.  He gets ulcerations on his legs that are slow to heal.  His swelling remains quite prominent.  Current Outpatient Medications  Medication Sig Dispense Refill  . ACIDOPHILUS LACTOBACILLUS PO Take by mouth.    . Ascorbic Acid (VITAMIN C) 1000 MG tablet Take by mouth.    . Biotin 2.5 MG TABS Take by mouth.    . Calcium Ascorbate 500 MG TABS Take by mouth.    . Cyanocobalamin (VITAMIN B 12 PO) Take by mouth.    . Ergocalciferol 2000 units TABS Take by mouth.    . folic acid (FOLVITE) 1 MG tablet Take by mouth.    . furosemide (LASIX) 40 MG tablet Take by mouth.    Marland Kitchen lisinopril-hydrochlorothiazide (PRINZIDE,ZESTORETIC) 10-12.5 MG tablet Take 1 tablet by mouth daily.    . Magnesium 100 MG CAPS Take by mouth.    . Potassium 99 MG TABS Take by mouth.    . Taurine 1000 MG CAPS Take by mouth.    . TURMERIC PO Take by mouth.     No current facility-administered medications for this visit.     Past Medical History:  Diagnosis Date  . Hypertension     History reviewed. No pertinent surgical history.  Social History     Social History  Substance Use Topics  . Smoking status: Current Some Day Smoker  . Smokeless tobacco: Never Used  . Alcohol use No     Family History No bleeding or clotting disorders      Allergies  Allergen Reactions  . Antihistamines, Chlorpheniramine-Type Nausea Only     REVIEW OF SYSTEMS(Negative unless checked)  Constitutional: [] Weight loss[] Fever[x] Chills Cardiac:[] Chest pain[] Chest  pressure[] Palpitations [] Shortness of breath when laying flat [] Shortness of breath at rest [] Shortness of breath with exertion. Vascular: [x] Pain in legs with walking[] Pain in legsat rest[] Pain in legs when laying flat [] Claudication [] Pain in feet when walking [] Pain in feet at rest [] Pain in feet when laying flat [] History of DVT [] Phlebitis [x] Swelling in legs [] Varicose veins [] Non-healing ulcers Pulmonary: [] Uses home oxygen [] Productive cough[] Hemoptysis [] Wheeze [] COPD [] Asthma Neurologic: [] Dizziness [] Blackouts [] Seizures [] History of stroke [] History of TIA[] Aphasia [] Temporary blindness[] Dysphagia [] Weaknessor numbness in arms [] Weakness or numbnessin legs Musculoskeletal: [x] Arthritis [] Joint swelling [] Joint pain [] Low back pain Hematologic:[] Easy bruising[] Easy bleeding [] Hypercoagulable state [] Anemic  Gastrointestinal:[] Blood in stool[] Vomiting blood[] Gastroesophageal reflux/heartburn[] Abdominal pain Genitourinary: [] Chronic kidney disease [] Difficulturination [] Frequenturination [] Burning with urination[] Hematuria Skin: [] Rashes [] Ulcers [] Wounds Psychological: [] History of anxiety[] History of major depression.    Physical Examination  BP 124/79 (BP Location: Left Arm, Patient Position: Sitting)   Pulse 66   Resp 15   Ht 6' (1.829 m)   Wt 271 lb (122.9 kg)   BMI 36.75 kg/m  Gen:  WD/WN, NAD Head: [place]/AT, No temporalis wasting. Ear/Nose/Throat: Hearing grossly intact, nares w/o erythema or drainage Eyes: Conjunctiva clear. Sclera non-icteric Neck: Supple.  Trachea midline Pulmonary:  Good air movement, no use of accessory muscles.  Cardiac: RRR, no JVD Vascular:  Vessel Right Left  Radial Palpable Palpable  PT  not palpable  not palpable  DP  trace palpable  not palpable    Musculoskeletal: M/S 5/5 throughout.  No deformity  or atrophy.  2-3+ right lower extremity edema, 3-4+ left lower extremity edema. Neurologic: Sensation grossly intact in extremities.  Symmetrical.  Speech is fluent.  Psychiatric: Judgment intact, Mood & affect appropriate for pt's clinical situation. Dermatologic: No rashes or ulcers noted.  No cellulitis or open wounds.       Labs No results found for this or any previous visit (from the past 2160 hour(s)).  Radiology No results found.  Assessment/Plan Essential hypertension, benign blood pressure control important in reducing the progression of atherosclerotic disease. On appropriate oral medications.   Swelling of limb Stable  Pain in limb Assessment for PAD is reasonable as he does have atherosclerotic risk factors and his clinical exam is difficult to discern due to his large amount of leg swelling.  Lymphedema Continue with compression, elevation, and the lymphedema pump.  Symptoms have somewhat plateaued.  I would hope that after the heat of the summer his swelling may go down a little bit in the fall.  Recheck in 4 to 6 months.    Festus Barren, MD  10/15/2017 11:18 AM    This note was created with Dragon medical transcription system.  Any errors from dictation are purely unintentional

## 2017-10-29 ENCOUNTER — Ambulatory Visit (INDEPENDENT_AMBULATORY_CARE_PROVIDER_SITE_OTHER): Payer: Medicare Other

## 2017-10-29 ENCOUNTER — Encounter (INDEPENDENT_AMBULATORY_CARE_PROVIDER_SITE_OTHER): Payer: Self-pay | Admitting: Vascular Surgery

## 2017-10-29 ENCOUNTER — Ambulatory Visit (INDEPENDENT_AMBULATORY_CARE_PROVIDER_SITE_OTHER): Payer: Medicare Other | Admitting: Vascular Surgery

## 2017-10-29 VITALS — BP 128/78 | HR 52 | Resp 16 | Ht 72.0 in | Wt 275.0 lb

## 2017-10-29 DIAGNOSIS — M79605 Pain in left leg: Secondary | ICD-10-CM | POA: Diagnosis not present

## 2017-10-29 DIAGNOSIS — I10 Essential (primary) hypertension: Secondary | ICD-10-CM

## 2017-10-29 DIAGNOSIS — R600 Localized edema: Secondary | ICD-10-CM | POA: Diagnosis not present

## 2017-10-29 DIAGNOSIS — F17210 Nicotine dependence, cigarettes, uncomplicated: Secondary | ICD-10-CM

## 2017-10-29 DIAGNOSIS — M79604 Pain in right leg: Secondary | ICD-10-CM | POA: Diagnosis not present

## 2017-10-29 DIAGNOSIS — I890 Lymphedema, not elsewhere classified: Secondary | ICD-10-CM

## 2017-10-29 DIAGNOSIS — M7989 Other specified soft tissue disorders: Secondary | ICD-10-CM

## 2017-10-29 NOTE — Progress Notes (Signed)
[name] : [id number]  Warren Leblanc is a 72 y.o. ([birth year]) male who presents with chief complaint of  Chief Complaint  Patient presents with  . Follow-up    ABI f/u  .  History of Present Illness: Patient returns today in follow up of pain to check his ABIs.  He was found to have normal ABIs today with strong biphasic waveforms and normal digital pressures consistent with no significant arterial insufficiency of the lower extremities.  His swelling is stable.  He is in his usual state of health with no other complaints  Current Outpatient Medications  Medication Sig Dispense Refill  . ACIDOPHILUS LACTOBACILLUS PO Take by mouth.    . Ascorbic Acid (VITAMIN C) 1000 MG tablet Take by mouth.    . Biotin 2.5 MG TABS Take by mouth.    . Calcium Ascorbate 500 MG TABS Take by mouth.    . Cyanocobalamin (VITAMIN B 12 PO) Take by mouth.    . Ergocalciferol 2000 units TABS Take by mouth.    . folic acid (FOLVITE) 1 MG tablet Take by mouth.    . furosemide (LASIX) 40 MG tablet Take by mouth.    Marland Kitchen lisinopril-hydrochlorothiazide (PRINZIDE,ZESTORETIC) 10-12.5 MG tablet Take 1 tablet by mouth daily.    . Magnesium 100 MG CAPS Take by mouth.    . Potassium 99 MG TABS Take by mouth.    . Taurine 1000 MG CAPS Take by mouth.    . TURMERIC PO Take by mouth.     No current facility-administered medications for this visit.         Past Medical History:  Diagnosis Date  . Hypertension     History reviewed. No pertinent surgical history.  Social History     Social History  Substance Use Topics  . Smoking status: Current Some Day Smoker  . Smokeless tobacco: Never Used  . Alcohol use No     Family History No bleeding or clotting disorders      Allergies  Allergen Reactions  . Antihistamines, Chlorpheniramine-Type Nausea Only     REVIEW OF SYSTEMS(Negative unless checked)  Constitutional: [] Weight  loss[] Fever[x] Chills Cardiac:[] Chest pain[] Chest pressure[] Palpitations [] Shortness of breath when laying flat [] Shortness of breath at rest [] Shortness of breath with exertion. Vascular: [x] Pain in legs with walking[] Pain in legsat rest[] Pain in legs when laying flat [] Claudication [] Pain in feet when walking [] Pain in feet at rest [] Pain in feet when laying flat [] History of DVT [] Phlebitis [x] Swelling in legs [] Varicose veins [] Non-healing ulcers Pulmonary: [] Uses home oxygen [] Productive cough[] Hemoptysis [] Wheeze [] COPD [] Asthma Neurologic: [] Dizziness [] Blackouts [] Seizures [] History of stroke [] History of TIA[] Aphasia [] Temporary blindness[] Dysphagia [] Weaknessor numbness in arms [] Weakness or numbnessin legs Musculoskeletal: [x] Arthritis [] Joint swelling [] Joint pain [] Low back pain Hematologic:[] Easy bruising[] Easy bleeding [] Hypercoagulable state [] Anemic  Gastrointestinal:[] Blood in stool[] Vomiting blood[] Gastroesophageal reflux/heartburn[] Abdominal pain Genitourinary: [] Chronic kidney disease [] Difficulturination [] Frequenturination [] Burning with urination[] Hematuria Skin: [] Rashes [] Ulcers [] Wounds Psychological: [] History of anxiety[] History of major depression.    Physical Examination  BP 128/78 (BP Location: Left Arm, Patient Position: Sitting)   Pulse (!) 52   Resp 16   Ht 6' (1.829 m)   Wt 275 lb (124.7 kg)   BMI 37.30 kg/m  Gen:  WD/WN, NAD Head: [place]/AT, No temporalis wasting. Ear/Nose/Throat: Hearing grossly intact, nares w/o erythema or drainage Eyes: Conjunctiva clear. Sclera non-icteric Neck: Supple.  Trachea midline Pulmonary:  Good air movement, no use of accessory muscles.  Cardiac: RRR, no JVD Vascular:  Vessel Right Left  Radial Palpable Palpable  PT  not palpable  not palpable  DP  trace palpable  not  palpable   Gastrointestinal: soft, non-tender/non-distended. No guarding/reflex.  Musculoskeletal: M/S 5/5 throughout.  No deformity or atrophy.  2+ right lower extremity edema, 3+ left lower extremity edema. Neurologic: Sensation grossly intact in extremities.  Symmetrical.  Speech is fluent.  Psychiatric: Judgment intact, Mood & affect appropriate for pt's clinical situation. Dermatologic: No rashes or ulcers noted.  No cellulitis or open wounds.       Labs No results found for this or any previous visit (from the past 2160 hour(s)).  Radiology No results found.  Assessment/Plan Essential hypertension, benign blood pressure control important in reducing the progression of atherosclerotic disease. On appropriate oral medications.   Swelling of limb Stable  Pain in limb ABIs are normal today with normal digital pressures. PAD is not the cause of his pain  Lymphedema Continue with compression, elevation, and the lymphedema pump.  Symptoms have somewhat plateaued.  I would hope that after the heat of the summer his swelling may go down a little bit in the fall.  Recheck in 4 to 6 months.     [name], MD  10/29/2017 11:36 AM    This note was created with Dragon medical transcription system.  Any errors from dictation are purely unintentional

## 2018-02-14 ENCOUNTER — Encounter (INDEPENDENT_AMBULATORY_CARE_PROVIDER_SITE_OTHER): Payer: Self-pay | Admitting: Vascular Surgery

## 2018-02-14 ENCOUNTER — Ambulatory Visit (INDEPENDENT_AMBULATORY_CARE_PROVIDER_SITE_OTHER): Payer: Medicare Other | Admitting: Vascular Surgery

## 2018-02-14 VITALS — BP 126/81 | HR 68 | Resp 18 | Ht 72.0 in | Wt 280.6 lb

## 2018-02-14 DIAGNOSIS — I10 Essential (primary) hypertension: Secondary | ICD-10-CM | POA: Diagnosis not present

## 2018-02-14 DIAGNOSIS — M7989 Other specified soft tissue disorders: Secondary | ICD-10-CM

## 2018-02-14 DIAGNOSIS — I890 Lymphedema, not elsewhere classified: Secondary | ICD-10-CM

## 2018-02-14 DIAGNOSIS — R600 Localized edema: Secondary | ICD-10-CM | POA: Diagnosis not present

## 2018-02-14 NOTE — Progress Notes (Signed)
[name] : [id number]  Warren Leblanc is a 73 y.o. (11-Dec-1945) male who presents with chief complaint of  Chief Complaint  Patient presents with  . Follow-up  .  History of Present Illness: Patient returns today in follow up of his lymphedema.  He is frustrated with his persistent leg swelling and pain.  He did not use his pump as much as usual over the Christmas holiday.  His left leg is worse than the right.  No ulceration or infection.    Current Outpatient Medications  Medication Sig Dispense Refill  . ACIDOPHILUS LACTOBACILLUS PO Take by mouth.    . Ascorbic Acid (VITAMIN C) 1000 MG tablet Take by mouth.    Marland Kitchen aspirin EC 81 MG tablet Take 81 mg by mouth daily.    . Biotin 2.5 MG TABS Take by mouth.    . Calcium Ascorbate 500 MG TABS Take by mouth.    . Cyanocobalamin (VITAMIN B 12 PO) Take by mouth.    . Ergocalciferol 2000 units TABS Take by mouth.    . folic acid (FOLVITE) 1 MG tablet Take by mouth.    Marland Kitchen lisinopril-hydrochlorothiazide (PRINZIDE,ZESTORETIC) 10-12.5 MG tablet Take 1 tablet by mouth daily.    . Magnesium 100 MG CAPS Take by mouth.    Marland Kitchen MAGNESIUM OXIDE PO Take by mouth.    . Potassium 99 MG TABS Take by mouth.    . Taurine 1000 MG CAPS Take by mouth.    . TURMERIC PO Take by mouth.    . furosemide (LASIX) 40 MG tablet Take by mouth as needed.      No current facility-administered medications for this visit.     Past Medical History:  Diagnosis Date  . Hypertension     No past surgical history on file.  Social History  Substance Use Topics  . Smoking status: Current Some Day Smoker  . Smokeless tobacco: Never Used  . Alcohol use No     Family History No bleeding or clotting disorders      Allergies  Allergen Reactions  . Antihistamines, Chlorpheniramine-Type Nausea Only     REVIEW OF SYSTEMS(Negative unless checked)  Constitutional: [] ?Weight loss[] ?Fever[x] ?Chills Cardiac:[] ?Chest pain[] ?Chest  pressure[] ?Palpitations [] ?Shortness of breath when laying flat [] ?Shortness of breath at rest [] ?Shortness of breath with exertion. Vascular: [x] ?Pain in legs with walking[] ?Pain in legsat rest[] ?Pain in legs when laying flat [] ?Claudication [] ?Pain in feet when walking [] ?Pain in feet at rest [] ?Pain in feet when laying flat [] ?History of DVT [] ?Phlebitis [x] ?Swelling in legs [] ?Varicose veins [] ?Non-healing ulcers Pulmonary: [] ?Uses home oxygen [] ?Productive cough[] ?Hemoptysis [] ?Wheeze [] ?COPD [] ?Asthma Neurologic: [] ?Dizziness [] ?Blackouts [] ?Seizures [] ?History of stroke [] ?History of TIA[] ?Aphasia [] ?Temporary blindness[] ?Dysphagia [] ?Weaknessor numbness in arms [] ?Weakness or numbnessin legs Musculoskeletal: [x] ?Arthritis [] ?Joint swelling [] ?Joint pain [] ?Low back pain Hematologic:[] ?Easy bruising[] ?Easy bleeding [] ?Hypercoagulable state [] ?Anemic  Gastrointestinal:[] ?Blood in stool[] ?Vomiting blood[] ?Gastroesophageal reflux/heartburn[] ?Abdominal pain Genitourinary: [] ?Chronic kidney disease [] ?Difficulturination [] ?Frequenturination [] ?Burning with urination[] ?Hematuria Skin: [] ?Rashes [] ?Ulcers [] ?Wounds Psychological: [] ?History of anxiety[] ?History of major depression.    Physical Examination  BP 126/81 (BP Location: Left Arm, Patient Position: Sitting, Cuff Size: Large)   Pulse 68   Resp 18   Ht 6' (1.829 m)   Wt 280 lb 9.6 oz (127.3 kg)   BMI 38.06 kg/m  Gen:  WD/WN, NAD Head: [place]/AT, No temporalis wasting. Ear/Nose/Throat: Hearing grossly intact, nares w/o erythema or drainage Eyes: Conjunctiva clear. Sclera non-icteric Neck: Supple.  Trachea midline Pulmonary:  Good air movement, no use of accessory muscles.  Cardiac: RRR, no JVD Vascular:  Vessel Right Left  Radial Palpable Palpable                           Musculoskeletal: M/S 5/5 throughout.  No  deformity or atrophy. 2+ RLE edema, 3+ LLE edema. Neurologic: Sensation grossly intact in extremities.  Symmetrical.  Speech is fluent.  Psychiatric: Judgment intact, Mood & affect appropriate for pt's clinical situation. Dermatologic: No rashes or ulcers noted.  No cellulitis or open wounds.       Labs No results found for this or any previous visit (from the past 2160 hour(s)).  Radiology No results found.  Assessment/Plan Essential hypertension, benign blood pressure control important in reducing the progression of atherosclerotic disease. On appropriate oral medications.   Swelling of limb Stable but not improved  Lymphedema He is going to increase his exercise regimen and use his pump more regularly.  He will also elevate his legs and he is try to lose weight.  RTC 3-4 months.    Warren Barren, MD  02/14/2018 11:43 AM    This note was created with Dragon medical transcription system.  Any errors from dictation are purely unintentional

## 2018-02-14 NOTE — Assessment & Plan Note (Signed)
He is going to increase his exercise regimen and use his pump more regularly.  He will also elevate his legs and he is try to lose weight.  RTC 3-4 months.

## 2018-02-14 NOTE — Patient Instructions (Signed)

## 2018-04-21 ENCOUNTER — Telehealth (INDEPENDENT_AMBULATORY_CARE_PROVIDER_SITE_OTHER): Payer: Self-pay | Admitting: Vascular Surgery

## 2018-04-21 NOTE — Telephone Encounter (Signed)
Patient stated he has a breakout under the skin near lymph nodes on the upper part of his legs

## 2018-04-21 NOTE — Telephone Encounter (Signed)
Based on this response, I feel that the patient should go to his PCP for treatment.  Typically we will handle rashes in some instances, those that are caused by swelling (below the knee) or rashes that may due to infection (also below the knee).  Based on the description and location of his rash, treatment by his PCP would be a better option.  If his PCP feels that this rash is related to a vascular cause, we can move up his appointment with Dr. Wyn Quaker

## 2018-04-21 NOTE — Telephone Encounter (Signed)
Please contact the patient to find out a bit more about his symptoms to determine if we are the best option vs. His primary care physician. What does that rash look like? Is it itchy? Does it seem to get better or worse at certain times? Has he tried anything to help with the rash? Is it associated with swelling?

## 2018-04-21 NOTE — Telephone Encounter (Signed)
Patient wanted to make an appointment with [facility] to discuss about his legs and he will be coming on 04/22/18

## 2018-04-22 ENCOUNTER — Ambulatory Visit (INDEPENDENT_AMBULATORY_CARE_PROVIDER_SITE_OTHER): Payer: Medicare Other | Admitting: Vascular Surgery

## 2018-04-22 ENCOUNTER — Other Ambulatory Visit: Payer: Self-pay

## 2018-04-22 ENCOUNTER — Encounter (INDEPENDENT_AMBULATORY_CARE_PROVIDER_SITE_OTHER): Payer: Self-pay | Admitting: Vascular Surgery

## 2018-04-22 VITALS — BP 148/84 | HR 59 | Resp 16 | Ht 72.0 in | Wt 279.0 lb

## 2018-04-22 DIAGNOSIS — F17200 Nicotine dependence, unspecified, uncomplicated: Secondary | ICD-10-CM

## 2018-04-22 DIAGNOSIS — R600 Localized edema: Secondary | ICD-10-CM

## 2018-04-22 DIAGNOSIS — R21 Rash and other nonspecific skin eruption: Secondary | ICD-10-CM

## 2018-04-22 DIAGNOSIS — I10 Essential (primary) hypertension: Secondary | ICD-10-CM

## 2018-04-22 DIAGNOSIS — I890 Lymphedema, not elsewhere classified: Secondary | ICD-10-CM

## 2018-04-22 DIAGNOSIS — Z79899 Other long term (current) drug therapy: Secondary | ICD-10-CM

## 2018-04-22 DIAGNOSIS — M7989 Other specified soft tissue disorders: Secondary | ICD-10-CM

## 2018-04-22 NOTE — Assessment & Plan Note (Signed)
Not entirely clear what the source of this rash on his inner thigh is related to but I do not think it is a vascular issue.  It is clearly not vasculitis.  He does not appear to have lymphangitis.  At this point, I recommended he consider seeing a dermatologist for further evaluation.  He will return with [place] in 3 to 4 months.

## 2018-04-22 NOTE — Progress Notes (Signed)
[name] : [id number]  Warren Leblanc is a 73 y.o. ([birth year]) male who presents with chief complaint of  Chief Complaint  Patient presents with  . Follow-up    check legs  .  History of Present Illness: Patient returns today in follow up of lymphedema and a new rash on his inner thighs.  This came up about 3 weeks ago and has not gotten better.  He had had jock itch prior to this and was taking some creams for that which improved.  He is concerned because it is near his lymph glands and he has longstanding lymphedema.  Continuous with use of compression stockings and the lymphedema pump have gradually improved his leg swelling although it remains quite prominent.  Current Outpatient Medications  Medication Sig Dispense Refill  . ACIDOPHILUS LACTOBACILLUS PO Take by mouth.    . Ascorbic Acid (VITAMIN C) 1000 MG tablet Take by mouth.    Marland Kitchen aspirin EC 81 MG tablet Take 81 mg by mouth daily.    . Biotin 2.5 MG TABS Take by mouth.    . Calcium Ascorbate 500 MG TABS Take by mouth.    . Cyanocobalamin (VITAMIN B 12 PO) Take by mouth.    . Ergocalciferol 2000 units TABS Take by mouth.    . folic acid (FOLVITE) 1 MG tablet Take by mouth.    . furosemide (LASIX) 40 MG tablet Take by mouth as needed.     Marland Kitchen lisinopril-hydrochlorothiazide (PRINZIDE,ZESTORETIC) 10-12.5 MG tablet Take 1 tablet by mouth daily.    . Magnesium 100 MG CAPS Take by mouth.    Marland Kitchen MAGNESIUM OXIDE PO Take by mouth.    . Potassium 99 MG TABS Take by mouth.    . Taurine 1000 MG CAPS Take by mouth.    . TURMERIC PO Take by mouth.     No current facility-administered medications for this visit.     Past Medical History:  Diagnosis Date  . Hypertension     No past surgical history on file.  Social History  Substance Use Topics  . Smoking status: Current Some Day Smoker  . Smokeless tobacco: Never Used  . Alcohol use No     Family History No bleeding or clotting disorders      Allergies  Allergen  Reactions  . Antihistamines, Chlorpheniramine-Type Nausea Only     REVIEW OF SYSTEMS(Negative unless checked)  Constitutional: [] ??Weight loss[] ??Fever[x] ??Chills Cardiac:[] ??Chest pain[] ??Chest pressure[] ??Palpitations [] ??Shortness of breath when laying flat [] ??Shortness of breath at rest [] ??Shortness of breath with exertion. Vascular: [x] ??Pain in legs with walking[] ??Pain in legsat rest[] ??Pain in legs when laying flat [] ??Claudication [] ??Pain in feet when walking [] ??Pain in feet at rest [] ??Pain in feet when laying flat [] ??History of DVT [] ??Phlebitis [x] ??Swelling in legs [] ??Varicose veins [] ??Non-healing ulcers Pulmonary: [] ??Uses home oxygen [] ??Productive cough[] ??Hemoptysis [] ??Wheeze [] ??COPD [] ??Asthma Neurologic: [] ??Dizziness [] ??Blackouts [] ??Seizures [] ??History of stroke [] ??History of TIA[] ??Aphasia [] ??Temporary blindness[] ??Dysphagia [] ??Weaknessor numbness in arms [] ??Weakness or numbnessin legs Musculoskeletal: [x] ??Arthritis [] ??Joint swelling [] ??Joint pain [] ??Low back pain Hematologic:[] ??Easy bruising[] ??Easy bleeding [] ??Hypercoagulable state [] ??Anemic  Gastrointestinal:[] ??Blood in stool[] ??Vomiting blood[] ??Gastroesophageal reflux/heartburn[] ??Abdominal pain Genitourinary: [] ??Chronic kidney disease [] ??Difficulturination [] ??Frequenturination [] ??Burning with urination[] ??Hematuria Skin: [x] ??Rashes [] ??Ulcers [] ??Wounds Psychological: [] ??History of anxiety[] ??History of major depression.    Physical Examination  BP (!) 148/84 (BP Location: Left Arm)   Pulse (!) 59   Resp 16   Ht 6' (1.829 m)   Wt 279 lb (126.6 kg)   BMI 37.84 kg/m  Gen:  WD/WN, NAD Head: [place]/AT, No temporalis wasting. Ear/Nose/Throat: Hearing grossly  intact, nares w/o erythema or drainage Eyes: Conjunctiva clear. Sclera non-icteric Neck: Supple.  Trachea  midline Pulmonary:  Good air movement, no use of accessory muscles.  Cardiac: RRR, no JVD Vascular:  Vessel Right Left  Radial Palpable Palpable                   Musculoskeletal: M/S 5/5 throughout.  No deformity or atrophy.  2-3+ bilateral lower extremity edema. Neurologic: Sensation grossly intact in extremities.  Symmetrical.  Speech is fluent.  Psychiatric: Judgment intact, Mood & affect appropriate for pt's clinical situation. Dermatologic: Nonraised dark skin discoloration on the medial thigh bilaterally just below the groin.  No erythema.  No tenderness.       Labs No results found for this or any previous visit (from the past 2160 hour(s)).  Radiology No results found.  Assessment/Plan Essential hypertension, benign blood pressure control important in reducing the progression of atherosclerotic disease. On appropriate oral medications.   Swelling of limb Stable but not improved  Lymphedema He is going to increase his exercise regimen and use his pump more regularly.  He will also elevate his legs and he is try to lose weight.  RTC 3-4 months.  Essential hypertension, benign blood pressure control important in reducing the progression of atherosclerotic disease. On appropriate oral medications.   Rash and nonspecific skin eruption Not entirely clear what the source of this rash on his inner thigh is related to but I do not think it is a vascular issue.  It is clearly not vasculitis.  He does not appear to have lymphangitis.  At this point, I recommended he consider seeing a dermatologist for further evaluation.  He will return with [place] in 3 to 4 months.    Festus Barren, MD  04/22/2018 12:12 PM    This note was created with Dragon medical transcription system.  Any errors from dictation are purely unintentional

## 2018-04-22 NOTE — Assessment & Plan Note (Signed)
blood pressure control important in reducing the progression of atherosclerotic disease. On appropriate oral medications.  

## 2018-05-20 ENCOUNTER — Ambulatory Visit (INDEPENDENT_AMBULATORY_CARE_PROVIDER_SITE_OTHER): Payer: Medicare Other | Admitting: Vascular Surgery

## 2018-07-25 ENCOUNTER — Ambulatory Visit (INDEPENDENT_AMBULATORY_CARE_PROVIDER_SITE_OTHER): Payer: Medicare Other | Admitting: Vascular Surgery

## 2018-07-25 ENCOUNTER — Encounter (INDEPENDENT_AMBULATORY_CARE_PROVIDER_SITE_OTHER): Payer: Self-pay | Admitting: Vascular Surgery

## 2018-07-25 ENCOUNTER — Other Ambulatory Visit: Payer: Self-pay

## 2018-07-25 VITALS — BP 142/87 | HR 58 | Resp 16 | Wt 292.0 lb

## 2018-07-25 DIAGNOSIS — I10 Essential (primary) hypertension: Secondary | ICD-10-CM

## 2018-07-25 DIAGNOSIS — I890 Lymphedema, not elsewhere classified: Secondary | ICD-10-CM | POA: Diagnosis not present

## 2018-07-25 DIAGNOSIS — R600 Localized edema: Secondary | ICD-10-CM

## 2018-07-25 DIAGNOSIS — M7989 Other specified soft tissue disorders: Secondary | ICD-10-CM

## 2018-07-25 NOTE — Patient Instructions (Signed)

## 2018-07-25 NOTE — Assessment & Plan Note (Signed)
blood pressure control important in reducing the progression of atherosclerotic disease. On appropriate oral medications.  

## 2018-07-25 NOTE — Assessment & Plan Note (Signed)
Worse.  He is only been doing the lymphedema pump once daily and he is going to try doing this twice daily.  He has a product that he is going to send me information on to discuss whether or not this may be of benefit as well.  We will keep a short follow-up of 3 months at this time.

## 2018-07-25 NOTE — Progress Notes (Signed)
[name] : [id number]  Warren Leblanc is a 73 y.o. (March 03, 1945) male who presents with chief complaint of  Chief Complaint  Patient presents with  . Follow-up    28month follow up  .  History of Present Illness: Patient returns today in follow up of his lymphedema.  He has gained some weight and seems to be retaining fluid all the way up into his abdomen.  His left leg is clearly a little worse than it was at his last visit.  His right leg is about the same.  He continues to use the lymphedema pump once daily and wear stockings every day.  Current Outpatient Medications  Medication Sig Dispense Refill  . ACIDOPHILUS LACTOBACILLUS PO Take by mouth.    . Ascorbic Acid (VITAMIN C) 1000 MG tablet Take by mouth.    Marland Kitchen aspirin EC 81 MG tablet Take 81 mg by mouth daily.    . Biotin 2.5 MG TABS Take by mouth.    . Calcium Ascorbate 500 MG TABS Take by mouth.    . Cyanocobalamin (VITAMIN B 12 PO) Take by mouth.    . Ergocalciferol 2000 units TABS Take by mouth.    . folic acid (FOLVITE) 1 MG tablet Take by mouth.    . furosemide (LASIX) 40 MG tablet Take by mouth as needed.     Marland Kitchen lisinopril-hydrochlorothiazide (PRINZIDE,ZESTORETIC) 10-12.5 MG tablet Take 1 tablet by mouth daily.    . Magnesium 100 MG CAPS Take by mouth.    Marland Kitchen MAGNESIUM OXIDE PO Take by mouth.    . Potassium 99 MG TABS Take by mouth.    . Taurine 1000 MG CAPS Take by mouth.    . TURMERIC PO Take by mouth.    Marland Kitchen UNABLE TO FIND Carnivora     No current facility-administered medications for this visit.     Past Medical History:  Diagnosis Date  . Hypertension     No past surgical history on file.  Social History  Substance Use Topics  . Smoking status: Current Some Day Smoker  . Smokeless tobacco: Never Used  . Alcohol use No     Family History No bleeding or clotting disorders      Allergies  Allergen Reactions  . Antihistamines, Chlorpheniramine-Type Nausea Only     REVIEW OF SYSTEMS(Negative  unless checked)  Constitutional: [] ???Weight loss[] ???Fever[x] ???Chills Cardiac:[] ???Chest pain[] ???Chest pressure[] ???Palpitations [] ???Shortness of breath when laying flat [] ???Shortness of breath at rest [] ???Shortness of breath with exertion. Vascular: [x] ???Pain in legs with walking[] ???Pain in legsat rest[] ???Pain in legs when laying flat [] ???Claudication [] ???Pain in feet when walking [] ???Pain in feet at rest [] ???Pain in feet when laying flat [] ???History of DVT [] ???Phlebitis [x] ???Swelling in legs [] ???Varicose veins [] ???Non-healing ulcers Pulmonary: [] ???Uses home oxygen [] ???Productive cough[] ???Hemoptysis [] ???Wheeze [] ???COPD [] ???Asthma Neurologic: [] ???Dizziness [] ???Blackouts [] ???Seizures [] ???History of stroke [] ???History of TIA[] ???Aphasia [] ???Temporary blindness[] ???Dysphagia [] ???Weaknessor numbness in arms [] ???Weakness or numbnessin legs Musculoskeletal: [x] ???Arthritis [] ???Joint swelling [] ???Joint pain [] ???Low back pain Hematologic:[] ???Easy bruising[] ???Easy bleeding [] ???Hypercoagulable state [] ???Anemic  Gastrointestinal:[] ???Blood in stool[] ???Vomiting blood[] ???Gastroesophageal reflux/heartburn[] ???Abdominal pain Genitourinary: [] ???Chronic kidney disease [] ???Difficulturination [] ???Frequenturination [] ???Burning with urination[] ???Hematuria Skin: [x] ???Rashes [] ???Ulcers [] ???Wounds Psychological: [] ???History of anxiety[] ???History of major depression.    Physical Examination  BP (!) 142/87 (BP Location: Left Arm)   Pulse (!) 58   Resp 16   Wt 292 lb (132.5 kg)   BMI 39.60 kg/m  Gen:  WD/WN, NAD Head: [place]/AT, No temporalis wasting. Ear/Nose/Throat: Hearing grossly intact, nares w/o erythema or drainage Eyes: Conjunctiva clear. Sclera non-icteric Neck: Supple.  Trachea  midline Pulmonary:  Good air movement, no use of accessory  muscles.  Cardiac: RRR, no JVD Vascular:  Vessel Right Left  Radial Palpable Palpable               Musculoskeletal: M/S 5/5 throughout.  No deformity or atrophy.  2+ right lower extremity edema, 3-4+ left lower extremity edema. Neurologic: Sensation grossly intact in extremities.  Symmetrical.  Speech is fluent.  Psychiatric: Judgment intact, Mood & affect appropriate for pt's clinical situation. Dermatologic: No rashes or ulcers noted.  No cellulitis or open wounds.       Labs No results found for this or any previous visit (from the past 2160 hour(s)).  Radiology No results found.  Assessment/Plan  Essential hypertension, benign blood pressure control important in reducing the progression of atherosclerotic disease. On appropriate oral medications.   Swelling of limb Worse.  He is only been doing the lymphedema pump once daily and he is going to try doing this twice daily.  He has a product that he is going to send me information on to discuss whether or not this may be of benefit as well.  We will keep a short follow-up of 3 months at this time.  Lymphedema Worse.  He is only been doing the lymphedema pump once daily and he is going to try doing this twice daily.  He has a product that he is going to send me information on to discuss whether or not this may be of benefit as well.  We will keep a short follow-up of 3 months at this time.    Festus BarrenJason Alaze Garverick, MD  07/25/2018 12:45 PM    This note was created with Dragon medical transcription system.  Any errors from dictation are purely unintentional

## 2018-07-25 NOTE — Assessment & Plan Note (Signed)
Worse.  He is only been doing the lymphedema pump once daily and he is going to try doing this twice daily.  He has a product that he is going to send me information on to discuss whether or not this may be of benefit as well.  We will keep a short follow-up of 3 months at this time. 

## 2018-10-28 ENCOUNTER — Encounter (INDEPENDENT_AMBULATORY_CARE_PROVIDER_SITE_OTHER): Payer: Self-pay | Admitting: Vascular Surgery

## 2018-10-28 ENCOUNTER — Ambulatory Visit (INDEPENDENT_AMBULATORY_CARE_PROVIDER_SITE_OTHER): Payer: Medicare Other | Admitting: Vascular Surgery

## 2018-10-28 ENCOUNTER — Other Ambulatory Visit: Payer: Self-pay

## 2018-10-28 ENCOUNTER — Encounter (INDEPENDENT_AMBULATORY_CARE_PROVIDER_SITE_OTHER): Payer: Self-pay

## 2018-10-28 VITALS — BP 149/90 | HR 56 | Resp 16 | Wt 292.0 lb

## 2018-10-28 DIAGNOSIS — R1900 Intra-abdominal and pelvic swelling, mass and lump, unspecified site: Secondary | ICD-10-CM | POA: Insufficient documentation

## 2018-10-28 DIAGNOSIS — M7989 Other specified soft tissue disorders: Secondary | ICD-10-CM

## 2018-10-28 DIAGNOSIS — I10 Essential (primary) hypertension: Secondary | ICD-10-CM

## 2018-10-28 DIAGNOSIS — I890 Lymphedema, not elsewhere classified: Secondary | ICD-10-CM

## 2018-10-28 DIAGNOSIS — L570 Actinic keratosis: Secondary | ICD-10-CM | POA: Diagnosis not present

## 2018-10-28 NOTE — Progress Notes (Addendum)
[name] : [id number]  Warren Leblanc is a 73 y.o. ([birth year]) male who presents with chief complaint of  Chief Complaint  Patient presents with  . Follow-up    58month follow up  .  History of Present Illness: Patient returns today in follow up of his lymphedema.  His symptoms seem to be getting worse despite him trying to comply with compression, elevation, and the regular use of the lymphedema pump.  He has some mild redness of his lower legs and has some antibiotics at home that he is going to start taking.  1 of the most concerning things to him at this point is the fact that the swelling is now involving his scrotum, and he has a large amount of fluid retention in his lower abdomen and flank area.  This has steadily progressed over the last year or 2 as he has been using the lymphedema pump on his legs.  This is very uncomfortable and bothersome to him.  Current Outpatient Medications  Medication Sig Dispense Refill  . ACIDOPHILUS LACTOBACILLUS PO Take by mouth.    . Ascorbic Acid (VITAMIN C) 1000 MG tablet Take by mouth.    Marland Kitchen aspirin EC 81 MG tablet Take 81 mg by mouth daily.    . Biotin 2.5 MG TABS Take by mouth.    . Calcium Ascorbate 500 MG TABS Take by mouth.    . Cyanocobalamin (VITAMIN B 12 PO) Take by mouth.    . Ergocalciferol 2000 units TABS Take by mouth.    . folic acid (FOLVITE) 1 MG tablet Take by mouth.    . furosemide (LASIX) 40 MG tablet Take by mouth as needed.     . Ginger Oil OIL by Does not apply route daily.    Marland Kitchen lisinopril-hydrochlorothiazide (PRINZIDE,ZESTORETIC) 10-12.5 MG tablet Take 1 tablet by mouth daily.    . Magnesium 100 MG CAPS Take by mouth.    Marland Kitchen MAGNESIUM OXIDE PO Take by mouth.    . Potassium 99 MG TABS Take by mouth.    . Taurine 1000 MG CAPS Take by mouth.    . TURMERIC PO Take by mouth.    Marland Kitchen UNABLE TO FIND Carnivora     No current facility-administered medications for this visit.     Past Medical History:  Diagnosis Date  . Hypertension      Past Surgical History Cholecystectomy Intussusception repair Repair of laceration under his chin as a child   Social History  Substance Use Topics  . Smoking status: Current Some Day Smoker  . Smokeless tobacco: Never Used  . Alcohol use No     Family History No bleeding or clotting disorders      Allergies  Allergen Reactions  . Antihistamines, Chlorpheniramine-Type Nausea Only     REVIEW OF SYSTEMS(Negative unless checked)  Constitutional: [] ????Weight loss[] ????Fever[x] ????Chills Cardiac:[] ????Chest pain[] ????Chest pressure[] ????Palpitations [] ????Shortness of breath when laying flat [] ????Shortness of breath at rest [] ????Shortness of breath with exertion. Vascular: [x] ????Pain in legs with walking[] ????Pain in legsat rest[] ????Pain in legs when laying flat [] ????Claudication [] ????Pain in feet when walking [] ????Pain in feet at rest [] ????Pain in feet when laying flat [] ????History of DVT [] ????Phlebitis [x] ????Swelling in legs [] ????Varicose veins [] ????Non-healing ulcers Pulmonary: [] ????Uses home oxygen [] ????Productive cough[] ????Hemoptysis [] ????Wheeze [] ????COPD [] ????Asthma Neurologic: [] ????Dizziness [] ????Blackouts [] ????Seizures [] ????History of stroke [] ????History of TIA[] ????Aphasia [] ????Temporary blindness[] ????Dysphagia [] ????Weaknessor numbness in arms [] ????Weakness or numbnessin legs Musculoskeletal: [x] ????Arthritis [] ????Joint swelling [] ????Joint pain [] ????Low back pain Hematologic:[] ????Easy bruising[] ????Easy bleeding [] ????Hypercoagulable state [] ????Anemic  Gastrointestinal:[] ????Blood in stool[] ????Vomiting blood[] ????Gastroesophageal reflux/heartburn[] ????Abdominal pain  Genitourinary: [] ????Chronic kidney disease [] ????Difficulturination [] ????Frequenturination [] ????Burning with urination[] ????Hematuria Skin:  [x] ????Rashes [] ????Ulcers [] ????Wounds Psychological: [] ????History of anxiety[] ????History of major depression.   Physical Examination  BP (!) 149/90 (BP Location: Left Arm)   Pulse (!) 56   Resp 16   Wt 292 lb (132.5 kg)   BMI 39.60 kg/m  Gen:  WD/WN, NAD Head: [place]/AT, No temporalis wasting. Ear/Nose/Throat: Hearing grossly intact, nares w/o erythema or drainage Eyes: Conjunctiva clear. Sclera non-icteric Neck: Supple.  Trachea midline Pulmonary:  Good air movement, no use of accessory muscles.  Cardiac: RRR, no JVD Vascular:  Vessel Right Left  Radial Palpable Palpable                      Abd: Some distention in the lower abdomen and pelvis area consistent with fluid retention.  This would be moderate in nature Musculoskeletal: M/S 5/5 throughout.  No deformity or atrophy.  2-3+ right lower extremity edema, 3-4+ left lower extremity edema.  Mild light erythema on the lower legs Neurologic: Sensation grossly intact in extremities.  Symmetrical.  Speech is fluent.  Psychiatric: Judgment intact, Mood & affect appropriate for pt's clinical situation. Dermatologic: No rashes or ulcers noted.  No cellulitis or open wounds.   Measurements of the legs and the lower abdomen were taken.  The measurements below represent the circumference in centimeters  Position  Right   Left Arch of the foot 30.5   30.5 5 cm above ankle 46.5   51 10 cm below knee 57.5   62 10 cm above knee 54.5   56.5 20 cm above knee 63   63.5  5 cm below navel circumference 128.5    Labs No results found for this or any previous visit (from the past 2160 hour(s)).  Radiology No results found.  Assessment/Plan Essential hypertension, benign blood pressure control important in reducing the progression of atherosclerotic disease. On appropriate oral medications.   Swelling of limb Worse.  He is using the lymphedema pump and wearing his stockings.  We discussed potentially doing Unna  boots today but he would prefer not to do that at this point.  The swelling has progressed up into his abdomen and pelvis area and I do think that getting a lymphedema pump that would involve the abdomen and pelvis would also be of great benefit. We will keep a short follow-up of 3 months at this time.  Abdominal swelling His swelling is now involving his lower abdomen and groin areas.  His scrotum is larger.  At this point, I think we actually need to get a lymphedema device that covers the lower abdomen and pelvis in addition to his legs.  This pump will be more helpful for him and should help his abdominal distention and scrotum.  He will continue to try to wear tight fitting underwear and elevate his scrotum as well.  Lymphedema Worse.  He is using the lymphedema pump and wearing his stockings.  We discussed potentially doing Unna boots today but he would prefer not to do that at this point.  The swelling has progressed up into his abdomen and pelvis area and I do think that getting a lymphedema pump that would involve the abdomen and pelvis would also be of great benefit. We will keep a short follow-up of 3 months at this time.    , MD  10/28/2018 12:21 PM    This note was created with Dragon medical transcription system.  Any errors from dictation are  purely unintentional

## 2018-10-28 NOTE — Assessment & Plan Note (Signed)
His swelling is now involving his lower abdomen and groin areas.  His scrotum is larger.  At this point, I think we actually need to get a lymphedema device that covers the lower abdomen and pelvis in addition to his legs.  This pump will be more helpful for him and should help his abdominal distention and scrotum.  He will continue to try to wear tight fitting underwear and elevate his scrotum as well.

## 2018-10-28 NOTE — Assessment & Plan Note (Addendum)
Worse.  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 He is using the lymphedema pump and wearing his stockings.  He wears the compression stockings diligently every day and these are 20 to 30 mmHg compression stockings.  He uses the lymphedema pump up to an hour twice a day and does manual drainage as best possible.  He also has daily elevation and has been trying to exercise as much as possible but the sheer weight of his legs makes this very difficult.  All appropriate medications and appropriate dietary modifications have been implemented without improvement.  Is a persistent, chronic and severe case of lymphedema.  The severity is identified by progression of disease despite all the appropriate therapies for years now.  We discussed potentially doing Unna boots today but he would prefer not to do that at this point.  The swelling has progressed up into his abdomen and pelvis area and I do think that getting a lymphedema pump that would involve the abdomen and pelvis would also be of great benefit.   This would be the E0652 calibrated programmable pump and appropriate garments to better treat the abdomen and groin area in addition to the legs.  We will keep a short follow-up of 3 months at this time.

## 2018-10-28 NOTE — Patient Instructions (Signed)

## 2019-02-03 ENCOUNTER — Other Ambulatory Visit: Payer: Self-pay

## 2019-02-03 ENCOUNTER — Ambulatory Visit (INDEPENDENT_AMBULATORY_CARE_PROVIDER_SITE_OTHER): Payer: Medicare Other | Admitting: Vascular Surgery

## 2019-02-03 ENCOUNTER — Encounter (INDEPENDENT_AMBULATORY_CARE_PROVIDER_SITE_OTHER): Payer: Self-pay | Admitting: Vascular Surgery

## 2019-02-03 VITALS — BP 140/86 | HR 59 | Resp 16 | Wt 295.0 lb

## 2019-02-03 DIAGNOSIS — I10 Essential (primary) hypertension: Secondary | ICD-10-CM | POA: Diagnosis not present

## 2019-02-03 DIAGNOSIS — I890 Lymphedema, not elsewhere classified: Secondary | ICD-10-CM | POA: Diagnosis not present

## 2019-02-03 NOTE — Assessment & Plan Note (Signed)
blood pressure control important in reducing the progression of atherosclerotic disease. On appropriate oral medications.  

## 2019-02-03 NOTE — Assessment & Plan Note (Signed)
The patient still has stage III lymphedema but it is encouraging to see he has had some improvement with a new lymphedema pump.  He will continue to use this daily.  Elevation, activity, and compression continue.  We will continue to see him every 3 to 4 months in follow-up.

## 2019-02-03 NOTE — Progress Notes (Signed)
[name] : [id number]  Warren Leblanc is a 73 y.o. (1945/04/26) male who presents with chief complaint of  Chief Complaint  Patient presents with  . Follow-up    61month follow up  .  History of Present Illness: Patient returns today in follow up of his lymphedema.  He has a new lymphedema pump that has created a marked improvement in his legs.  Although the calf swelling is still quite prominent it is better.  The swelling around his knee and thighs markedly improved.  This is true bilaterally.  He is very pleased with the results.  He is noticing the dry scaling skin in the calf a little more now and we discussed the use of Eucerin or other moisturizers.  No fevers or chills or signs of infection.  His leg pain is improved as well  Current Outpatient Medications  Medication Sig Dispense Refill  . ACIDOPHILUS LACTOBACILLUS PO Take by mouth.    . Ascorbic Acid (VITAMIN C) 1000 MG tablet Take by mouth.    Marland Kitchen aspirin EC 81 MG tablet Take 81 mg by mouth daily.    . Biotin 2.5 MG TABS Take by mouth.    . Calcium Ascorbate 500 MG TABS Take by mouth.    . Cyanocobalamin (VITAMIN B 12 PO) Take by mouth.    . Ergocalciferol 2000 units TABS Take by mouth.    . folic acid (FOLVITE) 1 MG tablet Take by mouth.    . furosemide (LASIX) 40 MG tablet Take by mouth as needed.     . Ginger Oil OIL by Does not apply route daily.    Marland Kitchen lisinopril-hydrochlorothiazide (ZESTORETIC) 20-12.5 MG tablet Take 2 tablets by mouth daily.     . Magnesium 100 MG CAPS Take by mouth.    Marland Kitchen MAGNESIUM OXIDE PO Take by mouth.    . Potassium 99 MG TABS Take by mouth.    . Taurine 1000 MG CAPS Take by mouth.    . TURMERIC PO Take by mouth.    Marland Kitchen UNABLE TO FIND Carnivora     No current facility-administered medications for this visit.    Past Medical History:  Diagnosis Date  . Hypertension     No past surgical history on file.      Social History  Substance Use Topics  . Smoking status: Current Some Day Smoker  .  Smokeless tobacco: Never Used  . Alcohol use No     Family History No bleeding or clotting disorders      Allergies  Allergen Reactions  . Antihistamines, Chlorpheniramine-Type Nausea Only     REVIEW OF SYSTEMS(Negative unless checked)  Constitutional: [] ?????Weight loss[] ?????Fever[x] ?????Chills Cardiac:[] ?????Chest pain[] ?????Chest pressure[] ?????Palpitations [] ?????Shortness of breath when laying flat [] ?????Shortness of breath at rest [] ?????Shortness of breath with exertion. Vascular: [x] ?????Pain in legs with walking[] ?????Pain in legsat rest[] ?????Pain in legs when laying flat [] ?????Claudication [] ?????Pain in feet when walking [] ?????Pain in feet at rest [] ?????Pain in feet when laying flat [] ?????History of DVT [] ?????Phlebitis [x] ?????Swelling in legs [] ?????Varicose veins [] ?????Non-healing ulcers Pulmonary: [] ?????Uses home oxygen [] ?????Productive cough[] ?????Hemoptysis [] ?????Wheeze [] ?????COPD [] ?????Asthma Neurologic: [] ?????Dizziness [] ?????Blackouts [] ?????Seizures [] ?????History of stroke [] ?????History of TIA[] ?????Aphasia [] ?????Temporary blindness[] ?????Dysphagia [] ?????Weaknessor numbness in arms [] ?????Weakness or numbnessin legs Musculoskeletal: [x] ?????Arthritis [] ?????Joint swelling [] ?????Joint pain [] ?????Low back pain Hematologic:[] ?????Easy bruising[] ?????Easy bleeding [] ?????Hypercoagulable state [] ?????Anemic  Gastrointestinal:[] ?????Blood in stool[] ?????Vomiting blood[] ?????Gastroesophageal reflux/heartburn[] ?????Abdominal pain Genitourinary: [] ?????Chronic kidney disease [] ?????Difficulturination [] ?????Frequenturination [] ?????Burning with urination[] ?????Hematuria Skin: [x] ?????Rashes [] ?????Ulcers [] ?????Wounds Psychological: [] ?????History of anxiety[] ?????History of major depression.     Physical  Examination  BP 140/86 (  BP Location: Left Arm)   Pulse (!) 59   Resp 16   Wt 295 lb (133.8 kg)   BMI 40.01 kg/m  Gen:  WD/WN, NAD Head: [place]/AT, No temporalis wasting. Ear/Nose/Throat: Hearing grossly intact, nares w/o erythema or drainage Eyes: Conjunctiva clear. Sclera non-icteric Neck: Supple.  Trachea midline Pulmonary:  Good air movement, no use of accessory muscles.  Cardiac: RRR, no JVD Vascular:  Vessel Right Left  Radial Palpable Palpable                       Musculoskeletal: M/S 5/5 throughout.  No deformity or atrophy.  His edema remains quite prominent and at least 3+ in the calves, but the thigh and knee area is markedly improved bilaterally. Neurologic: Sensation grossly intact in extremities.  Symmetrical.  Speech is fluent.  Psychiatric: Judgment intact, Mood & affect appropriate for pt's clinical situation. Dermatologic: No rashes or ulcers noted.  No cellulitis or open wounds.       Labs No results found for this or any previous visit (from the past 2160 hour(s)).  Radiology No results found.  Assessment/Plan  Essential hypertension, benign blood pressure control important in reducing the progression of atherosclerotic disease. On appropriate oral medications.   Lymphedema The patient still has stage III lymphedema but it is encouraging to see he has had some improvement with a new lymphedema pump.  He will continue to use this daily.  Elevation, activity, and compression continue.  We will continue to see him every 3 to 4 months in follow-up.    Festus Barren, MD  02/03/2019 11:31 AM    This note was created with Dragon medical transcription system.  Any errors from dictation are purely unintentional

## 2019-04-08 ENCOUNTER — Encounter (INDEPENDENT_AMBULATORY_CARE_PROVIDER_SITE_OTHER): Payer: Self-pay

## 2019-04-10 ENCOUNTER — Ambulatory Visit: Payer: Medicare Other | Attending: Internal Medicine

## 2019-04-10 DIAGNOSIS — Z23 Encounter for immunization: Secondary | ICD-10-CM | POA: Insufficient documentation

## 2019-04-10 NOTE — Progress Notes (Signed)
   Covid-19 Vaccination Clinic  Name:  Warren Leblanc    [name]: [id number] DOB: 03/27/1945  04/10/2019  Mr. Warren Leblanc was observed post Covid-19 immunization for 15 minutes without incident. He was provided with Vaccine Information Sheet and instruction to access the V-Safe system.   Mr. Warren Leblanc was instructed to call 911 with any severe reactions post vaccine: Marland Kitchen Difficulty breathing  . Swelling of face and throat  . A fast heartbeat  . A bad rash all over body  . Dizziness and weakness

## 2019-05-01 ENCOUNTER — Ambulatory Visit: Payer: Medicare Other | Attending: Internal Medicine

## 2019-05-01 DIAGNOSIS — Z23 Encounter for immunization: Secondary | ICD-10-CM

## 2019-05-01 NOTE — Progress Notes (Signed)
   Covid-19 Vaccination Clinic  Name:  JAQUARI RECKNER    [name]: [id number] DOB: 1945/09/05  05/01/2019  Mr. Whitmire was observed post Covid-19 immunization for 15 minutes without incident. He was provided with Vaccine Information Sheet and instruction to access the V-Safe system.   Mr. Usery was instructed to call 911 with any severe reactions post vaccine: Marland Kitchen Difficulty breathing  . Swelling of face and throat  . A fast heartbeat  . A bad rash all over body  . Dizziness and weakness   Immunizations Administered    Name Date Dose VIS Date Route   Pfizer COVID-19 Vaccine 05/01/2019 10:55 AM 0.3 mL 01/16/2019 Intramuscular   Manufacturer: [employer], [employer]   Lot: [id number]   NDC: [address]-4

## 2019-05-05 ENCOUNTER — Encounter (INDEPENDENT_AMBULATORY_CARE_PROVIDER_SITE_OTHER): Payer: Self-pay | Admitting: Vascular Surgery

## 2019-05-05 ENCOUNTER — Other Ambulatory Visit: Payer: Self-pay

## 2019-05-05 ENCOUNTER — Ambulatory Visit (INDEPENDENT_AMBULATORY_CARE_PROVIDER_SITE_OTHER): Payer: Medicare Other | Admitting: Vascular Surgery

## 2019-05-05 VITALS — BP 129/85 | HR 76 | Ht 72.0 in | Wt 292.0 lb

## 2019-05-05 DIAGNOSIS — I10 Essential (primary) hypertension: Secondary | ICD-10-CM | POA: Diagnosis not present

## 2019-05-05 DIAGNOSIS — L03115 Cellulitis of right lower limb: Secondary | ICD-10-CM | POA: Insufficient documentation

## 2019-05-05 DIAGNOSIS — M7989 Other specified soft tissue disorders: Secondary | ICD-10-CM | POA: Diagnosis not present

## 2019-05-05 DIAGNOSIS — I890 Lymphedema, not elsewhere classified: Secondary | ICD-10-CM

## 2019-05-05 DIAGNOSIS — R1907 Generalized intra-abdominal and pelvic swelling, mass and lump: Secondary | ICD-10-CM | POA: Diagnosis not present

## 2019-05-05 NOTE — Assessment & Plan Note (Signed)
His swelling is reasonably stable.  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  I have told him this is often seen at a young age or a congenital issue, but it is certainly possible.  I think it is reasonable to do an MRI to look for vascular malformations of Parkes Weber syndrome.  This can be done in the near future at his convenience for further assessment.

## 2019-05-05 NOTE — Patient Instructions (Signed)

## 2019-05-05 NOTE — Assessment & Plan Note (Signed)
He appears to have some cellulitis on the right leg at this point.  He has amoxicillin and will begin taking this daily 3 times daily for 10 days.  If he does not have improvement, further treatment may be necessary with a different antibiotic or IV antibiotics.

## 2019-05-05 NOTE — Progress Notes (Signed)
[name] : [id number]  Warren Leblanc is a 74 y.o. (Oct 21, 1945) male who presents with chief complaint of  Chief Complaint  Patient presents with  . Follow-up    3 Mo No studies  .  History of Present Illness: Patient returns today in follow up of his leg swelling and lymphedema.  Over the past week or 2, his right leg has become red and tender to palpation worse than before.  This is associated with worsening swelling that had been present.  Up until this point, he was actually doing a little bit better with the new lymphedema pump and elevation.  He is very knowledgeable and has done some research on [employer] syndrome and wonders if this may be what he has.  Current Outpatient Medications  Medication Sig Dispense Refill  . ACIDOPHILUS LACTOBACILLUS PO Take by mouth.    . Ascorbic Acid (VITAMIN C) 1000 MG tablet Take by mouth.    . Biotin 2.5 MG TABS Take by mouth.    . Calcium Ascorbate 500 MG TABS Take by mouth.    . folic acid (FOLVITE) 1 MG tablet Take by mouth.    . Ginger Oil OIL by Does not apply route daily.    Marland Kitchen lisinopril-hydrochlorothiazide (ZESTORETIC) 20-12.5 MG tablet Take 2 tablets by mouth daily.     Marland Kitchen MAGNESIUM OXIDE PO Take by mouth.    [employer] POWD Take one capsule by mouth twice daily.    . Potassium 99 MG TABS Take by mouth.    . Taurine 1000 MG CAPS Take by mouth.    . TURMERIC PO Take by mouth.    Marland Kitchen UNABLE TO FIND Carnivora    . aspirin EC 81 MG tablet Take 81 mg by mouth daily.    . Cyanocobalamin (VITAMIN B 12 PO) Take by mouth.    . Ergocalciferol 2000 units TABS Take by mouth.    . furosemide (LASIX) 40 MG tablet Take by mouth as needed.     . Magnesium 100 MG CAPS Take by mouth.     No current facility-administered medications for this visit.    Past Medical History:  Diagnosis Date  . Hypertension     No past surgical history on file.      Social History  Substance Use Topics  . Smoking status: Current Some Day Smoker  . Smokeless  tobacco: Never Used  . Alcohol use No     Family History No bleeding or clotting disorders      Allergies  Allergen Reactions  . Antihistamines, Chlorpheniramine-Type Nausea Only     REVIEW OF SYSTEMS(Negative unless checked)  Constitutional: [] ??????Weight loss[] ??????Fever[x] ??????Chills Cardiac:[] ??????Chest pain[] ??????Chest pressure[] ??????Palpitations [] ??????Shortness of breath when laying flat [] ??????Shortness of breath at rest [] ??????Shortness of breath with exertion. Vascular: [x] ??????Pain in legs with walking[] ??????Pain in legsat rest[] ??????Pain in legs when laying flat [] ??????Claudication [] ??????Pain in feet when walking [] ??????Pain in feet at rest [] ??????Pain in feet when laying flat [] ??????History of DVT [] ??????Phlebitis [x] ??????Swelling in legs [] ??????Varicose veins [] ??????Non-healing ulcers Pulmonary: [] ??????Uses home oxygen [] ??????Productive cough[] ??????Hemoptysis [] ??????Wheeze [] ??????COPD [] ??????Asthma Neurologic: [] ??????Dizziness [] ??????Blackouts [] ??????Seizures [] ??????History of stroke [] ??????History of TIA[] ??????Aphasia [] ??????Temporary blindness[] ??????Dysphagia [] ??????Weaknessor numbness in arms [] ??????Weakness or numbnessin legs Musculoskeletal: [x] ??????Arthritis [] ??????Joint swelling [] ??????Joint pain [] ??????Low back pain Hematologic:[] ??????Easy bruising[] ??????Easy bleeding [] ??????Hypercoagulable state [] ??????Anemic  Gastrointestinal:[] ??????Blood in stool[] ??????Vomiting blood[] ??????Gastroesophageal reflux/heartburn[] ??????Abdominal pain Genitourinary: [] ??????Chronic kidney disease [] ??????Difficulturination [] ??????Frequenturination [] ??????Burning with urination[] ??????Hematuria Skin: [x] ??????Rashes [] ??????Ulcers [] ??????Wounds Psychological: [] ??????History of anxiety[] ??????History of major  depression.     Physical Examination  BP 129/85  Pulse 76   Ht 6' (1.829 m)   Wt 292 lb (132.5 kg)   BMI 39.60 kg/m  Gen:  WD/WN, NAD Head: [place]/AT, No temporalis wasting. Ear/Nose/Throat: Hearing grossly intact, nares w/o erythema or drainage Eyes: Conjunctiva clear. Sclera non-icteric Neck: Supple.  Trachea midline Pulmonary:  Good air movement, no use of accessory muscles.  Cardiac: RRR, no JVD Vascular:  Vessel Right Left  Radial Palpable Palpable                Musculoskeletal: M/S 5/5 throughout.  No deformity or atrophy. 3+ BLE edema. Neurologic: Sensation grossly intact in extremities.  Symmetrical.  Speech is fluent.  Psychiatric: Judgment intact, Mood & affect appropriate for pt's clinical situation. Dermatologic: No rashes or ulcers noted. Right leg appears erythematous and somewhat tender to palpation today, worse than previous visits.       Labs No results found for this or any previous visit (from the past 2160 hour(s)).  Radiology No results found.  Assessment/Plan Essential hypertension, benign blood pressure control important in reducing the progression of atherosclerotic disease. On appropriate oral medications.   Lymphedema The patient still has stage III lymphedema but it is encouraging to see he has had some improvement with a new lymphedema pump.  He will continue to use this daily, even with the possible cellulitis in the right leg.  Elevation, activity, and compression continue.  We will continue to see him every 3 to 4 months in follow-up.   Swelling of limb His swelling is reasonably stable.  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  I have told him this is often seen at a young age or a congenital issue, but it is certainly possible.  I think it is reasonable to do an MRI to look for vascular malformations of Parkes Weber syndrome.  This can be done in the near  future at his convenience for further assessment.  Cellulitis of leg, right He appears to have some cellulitis on the right leg at this point.  He has amoxicillin and will begin taking this daily 3 times daily for 10 days.  If he does not have improvement, further treatment may be necessary with a different antibiotic or IV antibiotics.    Festus Barren, MD  05/05/2019 5:30 PM    This note was created with Dragon medical transcription system.  Any errors from dictation are purely unintentional

## 2019-05-28 ENCOUNTER — Ambulatory Visit
Admission: RE | Admit: 2019-05-28 | Discharge: 2019-05-28 | Disposition: A | Discharge status: Home / Self Care | Payer: Medicare Other | Source: Ambulatory Visit | Attending: Vascular Surgery | Admitting: Vascular Surgery

## 2019-05-28 ENCOUNTER — Other Ambulatory Visit: Payer: Self-pay

## 2019-05-28 DIAGNOSIS — R1907 Generalized intra-abdominal and pelvic swelling, mass and lump: Secondary | ICD-10-CM | POA: Diagnosis present

## 2019-05-28 DIAGNOSIS — N433 Hydrocele, unspecified: Secondary | ICD-10-CM | POA: Insufficient documentation

## 2019-05-28 DIAGNOSIS — R609 Edema, unspecified: Secondary | ICD-10-CM | POA: Diagnosis not present

## 2019-05-28 IMAGING — MR MR MRA EXTREM LOWER WO CM
4 series · 23 of 40 positions shown · IV contrast (agent unspecified)
Comparison: None.

CLINICAL DATA: 73-year-old male with bilateral lower extremity
lymphedema.

EXAM:
MR ANGIOGRAPHY ABDOMEN
MR ANGIOGRAPHY RIGHT LOWER EXTREMITY
MR ANGIOGRAPHY LEFT LOWER EXTREMITY
TECHNIQUE: Multiplanar multisequence MR imaging of the abdomen, pelvis and
lower extremities was performed using the standard protocol without
intravenous contrast. Multiplanar MR image reconstructions including
MIPs were obtained to evaluate the vascular anatomy.
CONTRAST:  None

[Series 15: [id number] · axial · B · 3.0mm · 0.50mm/px · z∈[+19,+887]mm · 7 of 450 slices shown]
[im 30/450]
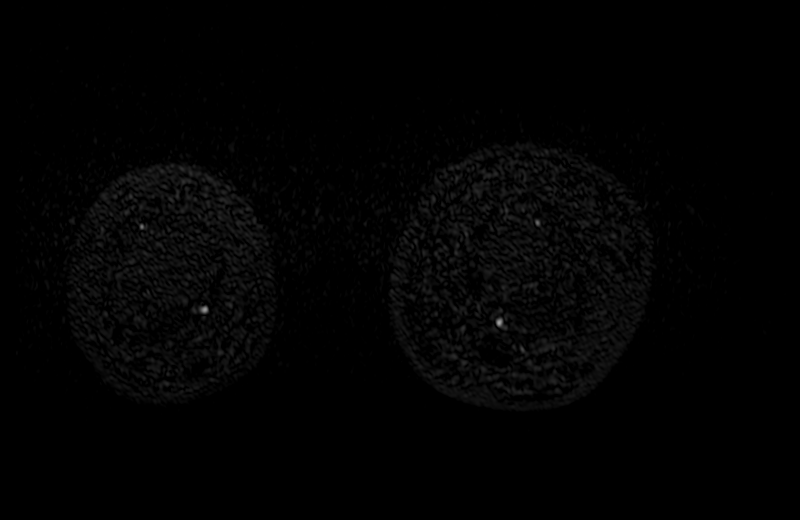
[im 60/450]
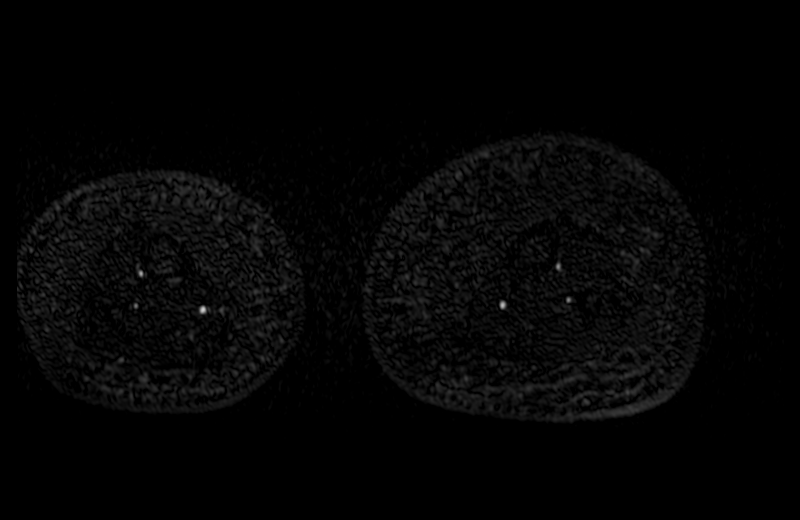
[im 90/450]
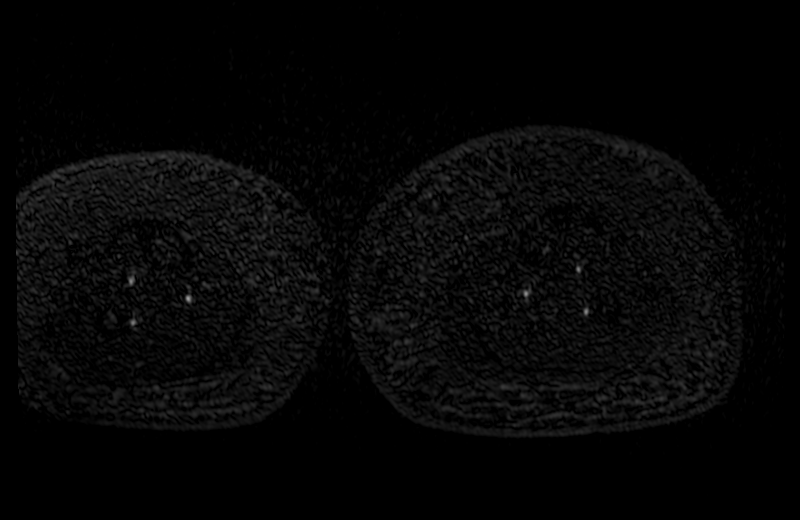
[im 150/450]
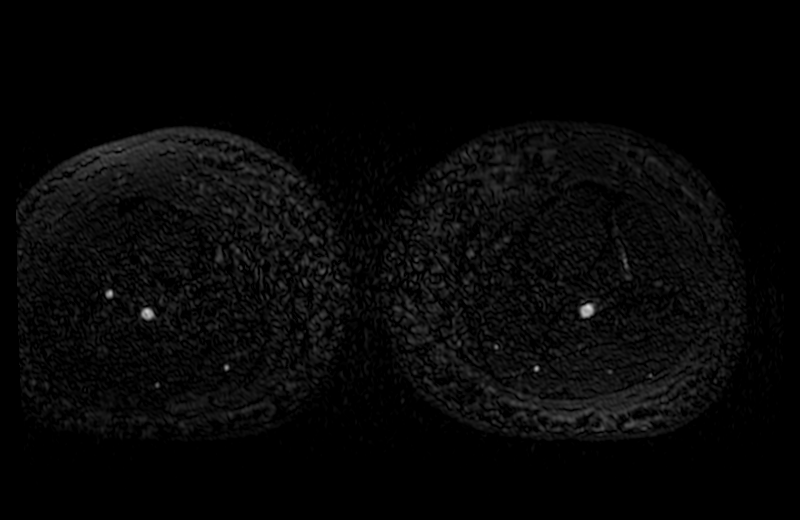
[im 210/450]
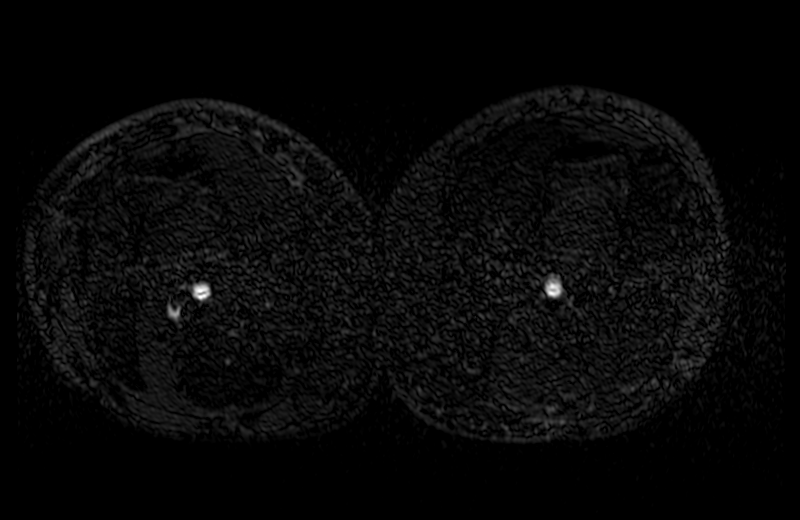
[im 240/450]
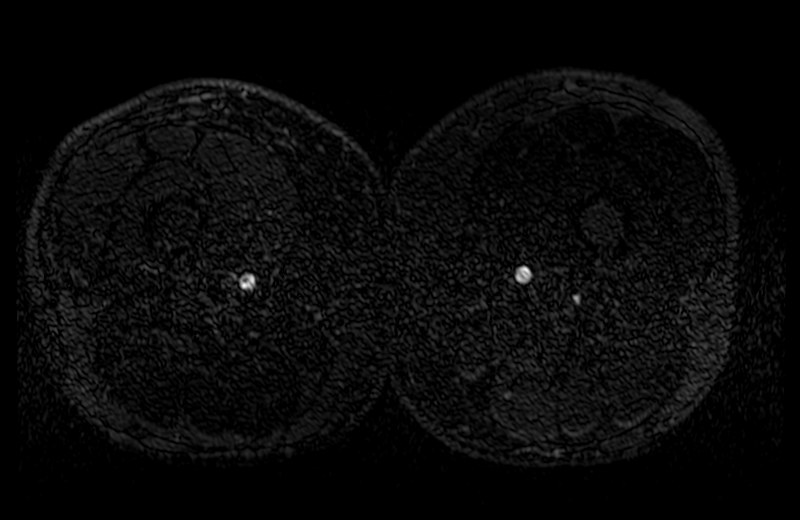
[im 390/450]
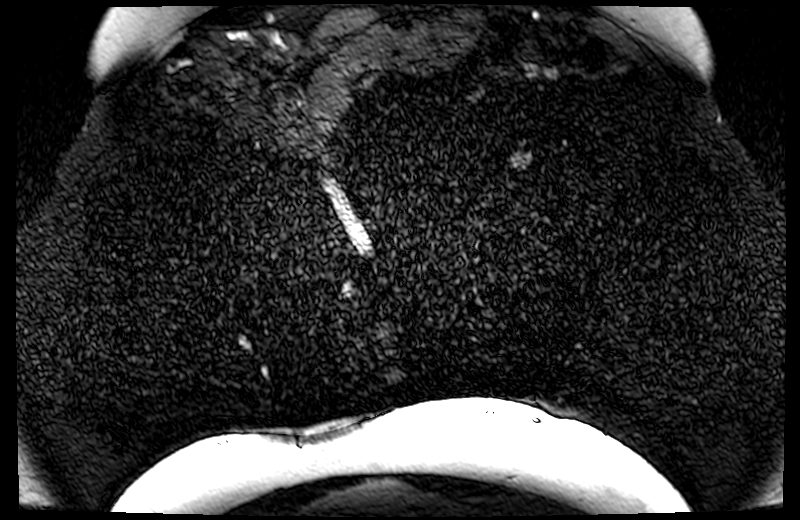

[Series 23: bSSFP · axial · B · 3.0mm · 0.78mm/px · z∈[+1,+119]mm · 2 of 50 slices shown (1 of 2)]
[im 1/50]
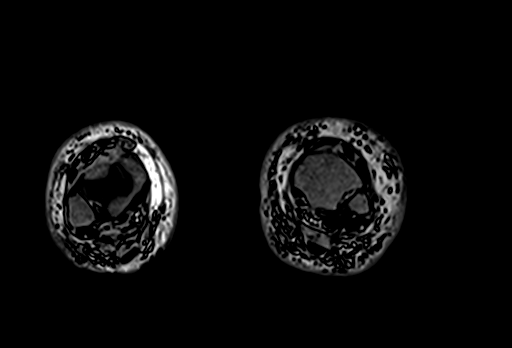
[im 50/50]
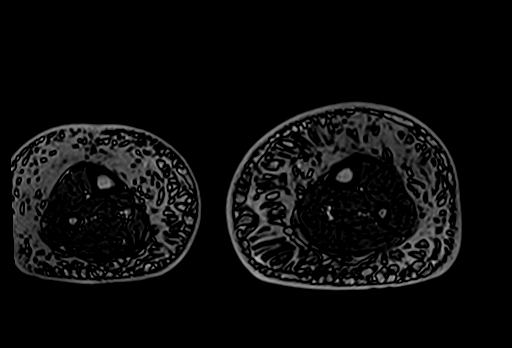

[Series 23: bSSFP · axial · B · 3.0mm · 0.78mm/px · z∈[+191,+769]mm · 3 of 300 slices shown (2 of 2)]
[im 30/300]
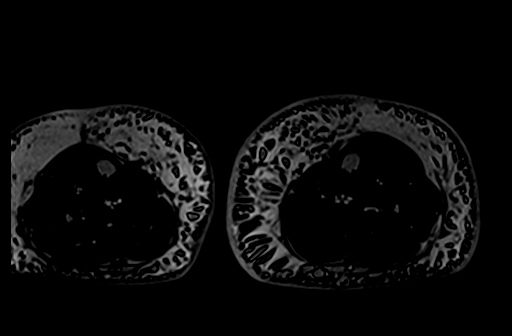
[im 150/300]
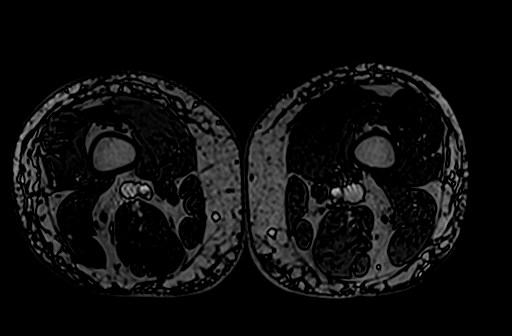
[im 270/300]
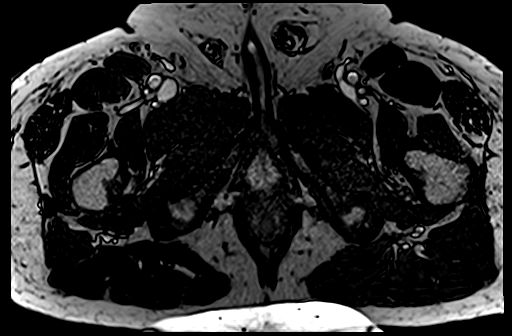

[Series 27: T1 dynamic · axial · non-contrast · B · 3.0mm · 1.25mm/px · z∈[-18,+899]mm · 11 of 308 slices shown]
[im 1/308]
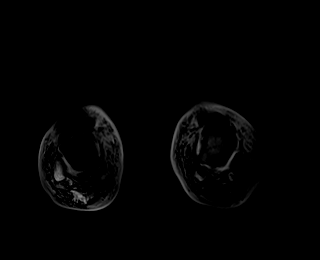
[im 31/308]
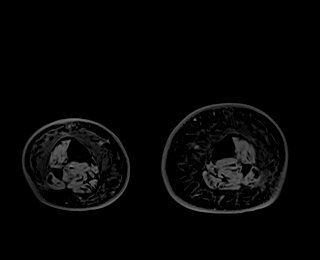
[im 62/308]
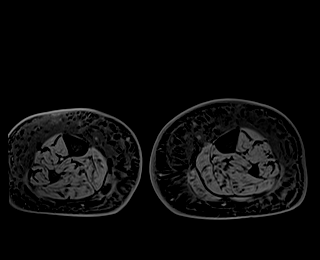
[im 93/308]
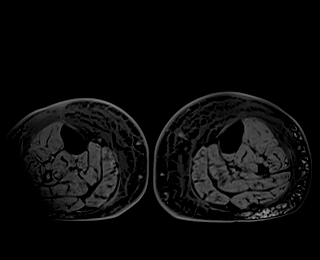
[im 123/308]
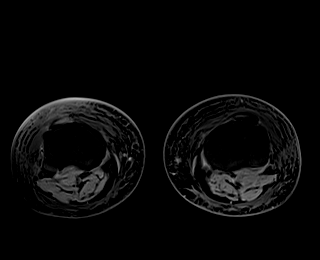
[im 154/308]
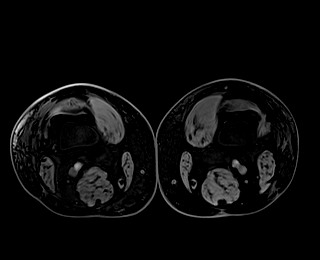
[im 185/308]
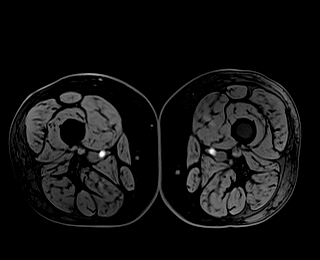
[im 215/308]
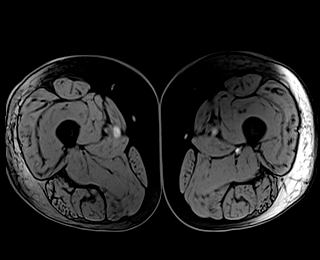
[im 246/308]
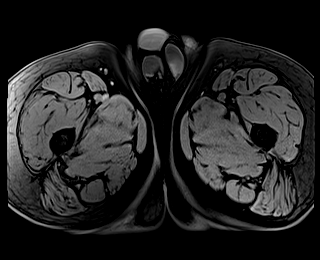
[im 277/308]
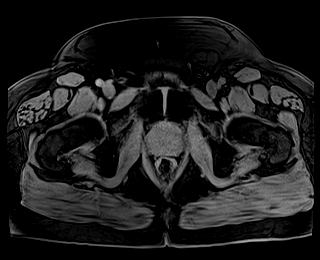
[im 308/308]
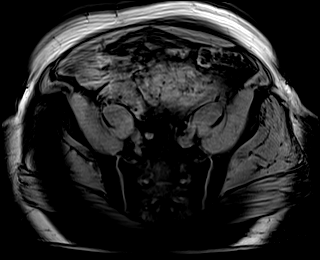

[23 of 40 positions shown; findings below may reference images not displayed]

FINDINGS: VASCULAR

Aorta: The visualized distal aorta is normal in caliber.

Right lower extremity: Widely patent iliac, femoral, popliteal and
runoff arteries. No evidence of stenosis, occlusion or vessel
irregularity. Patent 3 vessel runoff to the ankle.

Left lower extremity: Widely patent common iliac artery. Focal
signal loss in the tortuous left external iliac artery is favored to
be artifactual and related to vessel tortuosity rather than
representative of true stenosis or occlusion. Normal signal
throughout the left femoral

Veins: Non venous phase protocol. However, no evidence of signal
abnormality within the visualized iliac, femoral or popliteal veins
to suggest the presence of DVT.

Review of the MIP images confirms the above findings.

NON-VASCULAR

No focal signal abnormality within the visualized portion of the
lower abdomen or pelvis. Bilateral, right greater than left
hydroceles are noted incidentally. There is extensive subcutaneous
edema in the lower extremities beginning in the distal thigh and
extending to the ankles bilaterally.

No suspicious inguinal or pelvic lymphadenopathy to suggest an
obstructive source for lymphedema.
IMPRESSION: VASCULAR

1. No evidence of vascular stenosis, occlusion or irregularity in
either lower extremity.

NON-VASCULAR

1. Extensive symmetric bilateral lower extremity subcutaneous edema
beginning in the distal thighs and extending into the ankles.
2. Right greater than left hydroceles.
3. No evidence of pelvic or inguinal lymphadenopathy.

## 2019-08-11 ENCOUNTER — Telehealth (INDEPENDENT_AMBULATORY_CARE_PROVIDER_SITE_OTHER): Payer: Self-pay | Admitting: Vascular Surgery

## 2019-08-11 NOTE — Telephone Encounter (Signed)
Patient was made aware with medical advice and verbalized understanding 

## 2019-08-11 NOTE — Telephone Encounter (Signed)
Called stating he would like to come in to be seen. He said the lymp pumps arent helping his legs and would like to come in to talk about a different direction of plan of care. Patient was last seen 05-05-19 (no studies) with JD. Please advise.

## 2019-08-11 NOTE — Telephone Encounter (Signed)
The patient can be seen by JD with no studies..the patient should also be utilizing compression stocks and elevation of his lower extremities in addition to lymph pump

## 2019-08-18 ENCOUNTER — Ambulatory Visit (INDEPENDENT_AMBULATORY_CARE_PROVIDER_SITE_OTHER): Payer: Medicare Other | Admitting: Vascular Surgery

## 2019-08-18 ENCOUNTER — Other Ambulatory Visit: Payer: Self-pay

## 2019-08-18 ENCOUNTER — Encounter (INDEPENDENT_AMBULATORY_CARE_PROVIDER_SITE_OTHER): Payer: Self-pay | Admitting: Vascular Surgery

## 2019-08-18 VITALS — BP 125/80 | HR 69 | Ht 72.0 in | Wt 295.0 lb

## 2019-08-18 DIAGNOSIS — L03115 Cellulitis of right lower limb: Secondary | ICD-10-CM | POA: Diagnosis not present

## 2019-08-18 DIAGNOSIS — I10 Essential (primary) hypertension: Secondary | ICD-10-CM | POA: Diagnosis not present

## 2019-08-18 DIAGNOSIS — M7989 Other specified soft tissue disorders: Secondary | ICD-10-CM

## 2019-08-18 DIAGNOSIS — I890 Lymphedema, not elsewhere classified: Secondary | ICD-10-CM | POA: Diagnosis not present

## 2019-08-18 NOTE — Assessment & Plan Note (Signed)
Worse. We discussed using Unna boots today but he would like to hold off on this and try a higher pressure on his lymphedema pump which is certainly reasonable. If that does not work within a week, I think he needs to come in and get wrapped in Unna boots. Patient voices his understanding. 

## 2019-08-18 NOTE — Patient Instructions (Signed)

## 2019-08-18 NOTE — Assessment & Plan Note (Signed)
blood pressure control important in reducing the progression of atherosclerotic disease. On appropriate oral medications.  

## 2019-08-18 NOTE — Assessment & Plan Note (Signed)
Worse. We discussed using Unna boots today but he would like to hold off on this and try a higher pressure on his lymphedema pump which is certainly reasonable. If that does not work within a week, I think he needs to come in and get wrapped in [employer]. Patient voices his understanding.

## 2019-08-18 NOTE — Assessment & Plan Note (Signed)
A new Rx for Amoxil given today

## 2019-08-18 NOTE — Progress Notes (Signed)
[name] : [id number]  Warren Leblanc is a 74 y.o. (12-Dec-1945) male who presents with chief complaint of  Chief Complaint  Patient presents with   Follow-up    add on per phone note see JD no studies  .  History of Present Illness: Patient returns today in follow up of his lymphedema and leg swelling prior to his scheduled visit for continued worsening of his symptoms.  His calves are now so swollen he cannot get his compression socks on over them.  He has been using his lymphedema pump but cannot increase the pressure setting on the new device.  On his older device, he would increase the pressure and that would usually help these worsening flares.  He has some scabs on the skin in his calves bilaterally.  There is a little bit of redness in the right leg as well.  The legs are very painful and heavy.  He has gotten very frustrated with the situation at this point  Current Outpatient Medications  Medication Sig Dispense Refill   ACIDOPHILUS LACTOBACILLUS PO Take by mouth.     Ascorbic Acid (VITAMIN C) 1000 MG tablet Take by mouth.     aspirin EC 81 MG tablet Take 81 mg by mouth daily.     Biotin 2.5 MG TABS Take by mouth.     Calcium Ascorbate 500 MG TABS Take by mouth.     Cyanocobalamin (VITAMIN B 12 PO) Take by mouth.     Ergocalciferol 2000 units TABS Take by mouth.     folic acid (FOLVITE) 1 MG tablet Take by mouth.     furosemide (LASIX) 40 MG tablet Take by mouth as needed.      Ginger Oil OIL by Does not apply route daily.     lisinopril-hydrochlorothiazide (ZESTORETIC) 20-12.5 MG tablet Take 2 tablets by mouth daily.      Magnesium 100 MG CAPS Take by mouth.     MAGNESIUM OXIDE PO Take by mouth.     Nadide POWD Take one capsule by mouth twice daily.     Potassium 99 MG TABS Take by mouth.     Taurine 1000 MG CAPS Take by mouth.     TURMERIC PO Take by mouth.     UNABLE TO FIND Carnivora     No current facility-administered medications for this visit.     Past Medical History:  Diagnosis Date   Hypertension     No past surgical history on file.        Social History  Substance Use Topics   Smoking status: Current Some Day Smoker   Smokeless tobacco: Never Used   Alcohol use No    Family History No bleeding or clotting disorders      Allergies  Allergen Reactions   Antihistamines, Chlorpheniramine-Type Nausea Only     REVIEW OF SYSTEMS(Negative unless checked)  Constitutional: [] ???????Weight loss[] ???????Fever[x] ???????Chills Cardiac:[] ???????Chest pain[] ???????Chest pressure[] ???????Palpitations [] ???????Shortness of breath when laying flat [] ???????Shortness of breath at rest [] ???????Shortness of breath with exertion. Vascular: [x] ???????Pain in legs with walking[] ???????Pain in legsat rest[] ???????Pain in legs when laying flat [] ???????Claudication [] ???????Pain in feet when walking [] ???????Pain in feet at rest [] ???????Pain in feet when laying flat [] ???????History of DVT [] ???????Phlebitis [x] ???????Swelling in legs [] ???????Varicose veins [] ???????Non-healing ulcers Pulmonary: [] ???????Uses home oxygen [] ???????Productive cough[] ???????Hemoptysis [] ???????Wheeze [] ???????COPD [] ???????Asthma Neurologic: [] ???????Dizziness [] ???????Blackouts [] ???????Seizures [] ???????History of stroke [] ???????History of TIA[] ???????Aphasia [] ???????Temporary blindness[] ???????Dysphagia [] ???????Weaknessor numbness in arms [] ???????Weakness or numbnessin legs Musculoskeletal: [x] ???????Arthritis [] ???????Joint swelling [] ???????Joint pain [] ???????Low back pain Hematologic:[] ???????Easy bruising[] ???????Easy bleeding [] ???????Hypercoagulable  state [] ???????Anemic  Gastrointestinal:[] ???????Blood in stool[] ???????Vomiting blood[] ???????Gastroesophageal reflux/heartburn[] ???????Abdominal pain Genitourinary:  [] ???????Chronic kidney disease [] ???????Difficulturination [] ???????Frequenturination [] ???????Burning with urination[] ???????Hematuria Skin: [x] ???????Rashes [] ???????Ulcers [] ???????Wounds Psychological: [] ???????History of anxiety[] ???????History of major depression.     Physical Examination  BP 125/80    Pulse 69    Ht 6' (1.829 m)    Wt 295 lb (133.8 kg)    BMI 40.01 kg/m  Gen:  WD/WN, NAD Head: [place]/AT, No temporalis wasting. Ear/Nose/Throat: Hearing grossly intact, nares w/o erythema or drainage Eyes: Conjunctiva clear. Sclera non-icteric Neck: Supple.  Trachea midline Pulmonary:  Good air movement, no use of accessory muscles.  Cardiac: RRR, no JVD Vascular:  Vessel Right Left  Radial Palpable Palpable           Musculoskeletal: M/S 5/5 throughout.  No deformity or atrophy. 3-4+ BLE edema. Neurologic: Sensation grossly intact in extremities.  Symmetrical.  Speech is fluent.  Psychiatric: Judgment intact, Mood & affect appropriate for pt's clinical situation. Dermatologic: No rashes or ulcers noted.  No cellulitis or open wounds.       Labs No results found for this or any previous visit (from the past 2160 hour(s)).  Radiology No results found.  Assessment/Plan  Essential hypertension, benign  blood pressure control important in reducing the progression of atherosclerotic disease. On appropriate oral medications.   Cellulitis of leg, right A new Rx for Amoxil given today  Swelling of limb Worse. We discussed using Unna boots today but he would like to hold off on this and try a higher pressure on his lymphedema pump which is certainly reasonable. If that does not work within a week, I think he needs to come in and get wrapped in . Patient voices his understanding.  Lymphedema Worse. We discussed using Unna boots today but he would like to hold off on this and try a higher pressure on his lymphedema pump which is certainly  reasonable. If that does not work within a week, I think he needs to come in and get wrapped in . Patient voices his understanding.    , MD  08/18/2019 3:08 PM    This note was created with Dragon medical transcription system.  Any errors from dictation are purely unintentional

## 2019-11-11 ENCOUNTER — Telehealth (INDEPENDENT_AMBULATORY_CARE_PROVIDER_SITE_OTHER): Payer: Self-pay | Admitting: Vascular Surgery

## 2019-11-11 NOTE — Telephone Encounter (Signed)
The pt last saw Dr. Wyn Quaker on 7/13 for swelling of the right leg and lymphedema unna boots were discussed but the pt wanted to use his lymph pumps on a higher setting he was told if this did not work to come back in a week for [employer] .please advise.

## 2019-11-11 NOTE — Telephone Encounter (Signed)
I contacted him and tried to get him to come in for a wrap today. he refused states he has an open wound and you need to treat it not wrap it.  wants to see you. I offered him fallon tomorrow. refused wants to see you. offered him your 1st available which is the 15th. he demanded that you call him because he doesn't want a wrap and wants you to treat his open wound i advised him that if he did not want a wrap as that is all we would be able to do as we are not wound care. also suggest maybe that may be where he needs to go or the ER since he kept repeating this is an emergency and he just kept demanding that you call him.

## 2019-11-11 NOTE — Telephone Encounter (Signed)
Hey can come in for wraps then

## 2019-11-11 NOTE — Telephone Encounter (Signed)
Called stating that his right leg is weeping (lymphatic fluid) he states that it started last night. He would like to come in to be seen. He says he is not in any pain right now just uncomfortable. Patient was last seen 08-18-19 with F/U (JD). Please advise.

## 2019-11-13 ENCOUNTER — Ambulatory Visit (INDEPENDENT_AMBULATORY_CARE_PROVIDER_SITE_OTHER): Payer: Medicare Other | Admitting: Nurse Practitioner

## 2019-11-13 ENCOUNTER — Other Ambulatory Visit: Payer: Self-pay

## 2019-11-13 VITALS — BP 137/79 | HR 67 | Ht 72.0 in | Wt 300.0 lb

## 2019-11-13 DIAGNOSIS — I890 Lymphedema, not elsewhere classified: Secondary | ICD-10-CM | POA: Diagnosis not present

## 2019-11-13 NOTE — Progress Notes (Signed)
History of Present Illness  There is no documented history at this time  Assessments & Plan   There are no diagnoses linked to this encounter.    Additional instructions  Subjective:  Patient presents with venous ulcer of the Bilateral lower extremity.    Procedure:  3 layer unna wrap was placed Bilateral lower extremity.   Plan:   Follow up in one week.  

## 2019-11-16 ENCOUNTER — Encounter (INDEPENDENT_AMBULATORY_CARE_PROVIDER_SITE_OTHER): Payer: Self-pay | Admitting: Nurse Practitioner

## 2019-11-17 ENCOUNTER — Other Ambulatory Visit: Payer: Self-pay

## 2019-11-17 ENCOUNTER — Encounter (INDEPENDENT_AMBULATORY_CARE_PROVIDER_SITE_OTHER): Payer: Self-pay | Admitting: Vascular Surgery

## 2019-11-17 ENCOUNTER — Ambulatory Visit (INDEPENDENT_AMBULATORY_CARE_PROVIDER_SITE_OTHER): Payer: Medicare Other | Admitting: Vascular Surgery

## 2019-11-17 DIAGNOSIS — L97212 Non-pressure chronic ulcer of right calf with fat layer exposed: Secondary | ICD-10-CM | POA: Diagnosis not present

## 2019-11-17 NOTE — Progress Notes (Signed)
[name] : [id number]  Warren Leblanc is a 74 y.o. ([birth year]) male who presents with chief complaint of  Chief Complaint  Patient presents with  . Follow-up    17month follow up  .  History of Present Illness: Patient returns today in follow up of his leg swelling and weeping. We started UNNA boots last week and replaced these today.  He still has a lot of swelling and some weeping. No fever or chills.   Current Outpatient Medications  Medication Sig Dispense Refill  . ACIDOPHILUS LACTOBACILLUS PO Take by mouth.    . Ascorbic Acid (VITAMIN C) 1000 MG tablet Take by mouth.    Marland Kitchen aspirin EC 81 MG tablet Take 81 mg by mouth daily.    . Biotin 2.5 MG TABS Take by mouth.    . Calcium Ascorbate 500 MG TABS Take by mouth.    . Cyanocobalamin (VITAMIN B 12 PO) Take by mouth.    . Ergocalciferol 2000 units TABS Take by mouth.    . folic acid (FOLVITE) 1 MG tablet Take by mouth.    . furosemide (LASIX) 40 MG tablet Take by mouth as needed.     . Ginger Oil OIL by Does not apply route daily.    Marland Kitchen lisinopril-hydrochlorothiazide (ZESTORETIC) 20-12.5 MG tablet Take 2 tablets by mouth daily.     . Magnesium 100 MG CAPS Take by mouth.    Marland Kitchen MAGNESIUM OXIDE PO Take by mouth.    Darlin Priestly POWD Take one capsule by mouth twice daily.    . Potassium 99 MG TABS Take by mouth.    . Taurine 1000 MG CAPS Take by mouth.    . TURMERIC PO Take by mouth.    Marland Kitchen UNABLE TO FIND Carnivora     No current facility-administered medications for this visit.    Past Medical History:  Diagnosis Date  . Hypertension     No past surgical history on file.         Social History  Substance Use Topics  . Smoking status: Current Some Day Smoker  . Smokeless tobacco: Never Used  . Alcohol use No   Family History No bleeding or clotting disorders      Allergies  Allergen Reactions  . Antihistamines, Chlorpheniramine-Type Nausea Only     REVIEW OF SYSTEMS(Negative unless  checked)  Constitutional: [] ????????Weight loss[] ????????Fever[x] ????????Chills Cardiac:[] ????????Chest pain[] ????????Chest pressure[] ????????Palpitations [] ????????Shortness of breath when laying flat [] ????????Shortness of breath at rest [] ????????Shortness of breath with exertion. Vascular: [x] ????????Pain in legs with walking[] ????????Pain in legsat rest[] ????????Pain in legs when laying flat [] ????????Claudication [] ????????Pain in feet when walking [] ????????Pain in feet at rest [] ????????Pain in feet when laying flat [] ????????History of DVT [] ????????Phlebitis [x] ????????Swelling in legs [] ????????Varicose veins [] ????????Non-healing ulcers Pulmonary: [] ????????Uses home oxygen [] ????????Productive cough[] ????????Hemoptysis [] ????????Wheeze [] ????????COPD [] ????????Asthma Neurologic: [] ????????Dizziness [] ????????Blackouts [] ????????Seizures [] ????????History of stroke [] ????????History of TIA[] ????????Aphasia [] ????????Temporary blindness[] ????????Dysphagia [] ????????Weaknessor numbness in arms [] ????????Weakness or numbnessin legs Musculoskeletal: [x] ????????Arthritis [] ????????Joint swelling [] ????????Joint pain [] ????????Low back pain Hematologic:[] ????????Easy bruising[] ????????Easy bleeding [] ????????Hypercoagulable state [] ????????Anemic  Gastrointestinal:[] ????????Blood in stool[] ????????Vomiting blood[] ????????Gastroesophageal reflux/heartburn[] ????????Abdominal pain Genitourinary: [] ????????Chronic kidney disease [] ????????Difficulturination [] ????????Frequenturination [] ????????Burning with urination[] ????????Hematuria Skin: [x] ????????Rashes [] ????????Ulcers [] ????????Wounds Psychological: [] ????????History of anxiety[] ????????History of major depression.    Physical Examination  BP (!) 143/83 (BP Location: Right Arm)   Pulse 66   Resp 16   Wt 299 lb  (135.6 kg)   BMI 40.55 kg/m  Gen:  WD/WN, NAD Head: [place]/AT, No temporalis wasting. Ear/Nose/Throat: Hearing grossly intact, nares w/o erythema or drainage Eyes: Conjunctiva clear. Sclera non-icteric Neck: Supple.  Trachea midline Pulmonary:  Good air movement, no use of accessory muscles.  Cardiac: RRR, no JVD Vascular:  Vessel Right Left  Radial Palpable Palpable               Musculoskeletal: M/S 5/5 throughout.  No deformity or atrophy. 3-4+ BLE edema. Neurologic: Sensation grossly intact in extremities.  Symmetrical.  Speech is fluent.  Psychiatric: Judgment intact, Mood & affect appropriate for pt's clinical situation. Dermatologic: superficial wounds present to both LE.       Labs No results found for this or any previous visit (from the past 2160 hour(s)).  Radiology No results found.  Assessment/Plan Essential hypertension, benign blood pressure control important in reducing the progression of atherosclerotic disease. On appropriate oral medications.   Cellulitis of leg, right Finishing up Amoxil now. Will rewrap UNNA boots today.  Swelling of limb Worse. We discussed using Unna boots today but he would like to hold off on this and try a higher pressure on his lymphedema pump which is certainly reasonable. If that does not work within a week, I think he needs to come in and get wrapped in [employer]. Patient voices his understanding.  Lower limb ulcer, calf, right, with fat layer exposed (HCC) Three layer UNNA boots placed on both legs today and will be changed weekly.    Festus Barren, MD  11/17/2019 4:24 PM    This note was created with Dragon medical transcription system.  Any errors from dictation are purely unintentional

## 2019-11-17 NOTE — Assessment & Plan Note (Signed)
Three layer UNNA boots placed on both legs today and will be changed weekly.

## 2019-11-24 ENCOUNTER — Encounter (INDEPENDENT_AMBULATORY_CARE_PROVIDER_SITE_OTHER): Payer: Self-pay

## 2019-11-24 ENCOUNTER — Ambulatory Visit (INDEPENDENT_AMBULATORY_CARE_PROVIDER_SITE_OTHER): Payer: Medicare Other | Admitting: Vascular Surgery

## 2019-11-24 ENCOUNTER — Other Ambulatory Visit: Payer: Self-pay

## 2019-11-24 ENCOUNTER — Ambulatory Visit (INDEPENDENT_AMBULATORY_CARE_PROVIDER_SITE_OTHER): Payer: Medicare Other | Admitting: Nurse Practitioner

## 2019-11-24 VITALS — BP 141/84 | HR 69 | Resp 16 | Wt 297.0 lb

## 2019-11-24 DIAGNOSIS — L97212 Non-pressure chronic ulcer of right calf with fat layer exposed: Secondary | ICD-10-CM

## 2019-11-24 NOTE — Progress Notes (Signed)
History of Present Illness  There is no documented history at this time  Assessments & Plan   There are no diagnoses linked to this encounter.    Additional instructions  Subjective:  Patient presents with venous ulcer of the Bilateral lower extremity.    Procedure:  3 layer unna wrap was placed Bilateral lower extremity.   Plan:   Follow up in one week.  

## 2019-11-29 ENCOUNTER — Encounter (INDEPENDENT_AMBULATORY_CARE_PROVIDER_SITE_OTHER): Payer: Self-pay | Admitting: Nurse Practitioner

## 2019-12-01 ENCOUNTER — Ambulatory Visit (INDEPENDENT_AMBULATORY_CARE_PROVIDER_SITE_OTHER): Payer: Medicare Other | Admitting: Nurse Practitioner

## 2019-12-01 ENCOUNTER — Other Ambulatory Visit: Payer: Self-pay

## 2019-12-01 VITALS — BP 144/87 | HR 74 | Ht 72.0 in | Wt 293.0 lb

## 2019-12-01 DIAGNOSIS — L97212 Non-pressure chronic ulcer of right calf with fat layer exposed: Secondary | ICD-10-CM | POA: Diagnosis not present

## 2019-12-01 NOTE — Progress Notes (Signed)
History of Present Illness  There is no documented history at this time  Assessments & Plan   There are no diagnoses linked to this encounter.    Additional instructions  Subjective:  Patient presents with venous ulcer of the Bilateral lower extremity.    Procedure:  3 layer unna wrap was placed Bilateral lower extremity.   Plan:   Follow up in one week.  

## 2019-12-06 ENCOUNTER — Encounter (INDEPENDENT_AMBULATORY_CARE_PROVIDER_SITE_OTHER): Payer: Self-pay | Admitting: Nurse Practitioner

## 2019-12-08 ENCOUNTER — Other Ambulatory Visit: Payer: Self-pay

## 2019-12-08 ENCOUNTER — Ambulatory Visit (INDEPENDENT_AMBULATORY_CARE_PROVIDER_SITE_OTHER): Payer: Medicare Other | Admitting: Vascular Surgery

## 2019-12-08 VITALS — BP 145/86 | HR 67 | Ht 72.0 in | Wt 291.0 lb

## 2019-12-08 DIAGNOSIS — I10 Essential (primary) hypertension: Secondary | ICD-10-CM

## 2019-12-08 DIAGNOSIS — L97212 Non-pressure chronic ulcer of right calf with fat layer exposed: Secondary | ICD-10-CM

## 2019-12-08 DIAGNOSIS — L03115 Cellulitis of right lower limb: Secondary | ICD-10-CM

## 2019-12-08 DIAGNOSIS — M7989 Other specified soft tissue disorders: Secondary | ICD-10-CM

## 2019-12-08 DIAGNOSIS — I890 Lymphedema, not elsewhere classified: Secondary | ICD-10-CM

## 2019-12-08 NOTE — Progress Notes (Signed)
[name] : [id number]  Warren Leblanc is a 74 y.o. (March 18, 1945) male who presents with chief complaint of  Chief Complaint  Patient presents with  . Follow-up    4 week bil unna boot check  .  History of Present Illness: Patient returns today in follow up of of his lymphedema.  He had ulcerations on the right leg and we have done Unna boots for a few weeks now.  This has resulted in skin healing with no current ulcerations.  His swelling is significantly improved although still prominent.  He has lost 9 pounds which was clearly all fluid weight.  His legs are less red.  No fever or chills.  Current Outpatient Medications  Medication Sig Dispense Refill  . ACIDOPHILUS LACTOBACILLUS PO Take by mouth.    . Ascorbic Acid (VITAMIN C) 1000 MG tablet Take by mouth.    Marland Kitchen aspirin EC 81 MG tablet Take 81 mg by mouth daily.    . Biotin 2.5 MG TABS Take by mouth.    . Calcium Ascorbate 500 MG TABS Take by mouth.    . Cyanocobalamin (VITAMIN B 12 PO) Take by mouth.    . Ergocalciferol 2000 units TABS Take by mouth.    . folic acid (FOLVITE) 1 MG tablet Take by mouth.    . furosemide (LASIX) 40 MG tablet Take by mouth as needed.     . Ginger Oil OIL by Does not apply route daily.    Marland Kitchen lisinopril-hydrochlorothiazide (ZESTORETIC) 20-12.5 MG tablet Take 2 tablets by mouth daily.     . Magnesium 100 MG CAPS Take by mouth.    Marland Kitchen MAGNESIUM OXIDE PO Take by mouth.    Darlin Priestly POWD Take one capsule by mouth twice daily.    . Potassium 99 MG TABS Take by mouth.    . Taurine 1000 MG CAPS Take by mouth.    . TURMERIC PO Take by mouth.    Marland Kitchen UNABLE TO FIND Carnivora     No current facility-administered medications for this visit.    Past Medical History:  Diagnosis Date  . Hypertension   Lymphedema Recurrent LE cellulitis  No past surgical history on file.       Social History  Substance Use Topics  . Smoking status: Current Some Day Smoker  . Smokeless tobacco: Never Used  . Alcohol use No     Family History No bleeding or clotting disorders      Allergies  Allergen Reactions  . Antihistamines, Chlorpheniramine-Type Nausea Only     REVIEW OF SYSTEMS(Negative unless checked)  Constitutional: [] ?????????Weight loss[] ?????????Fever[x] ?????????Chills Cardiac:[] ?????????Chest pain[] ?????????Chest pressure[] ?????????Palpitations [] ?????????Shortness of breath when laying flat [] ?????????Shortness of breath at rest [] ?????????Shortness of breath with exertion. Vascular: [x] ?????????Pain in legs with walking[] ?????????Pain in legsat rest[] ?????????Pain in legs when laying flat [] ?????????Claudication [] ?????????Pain in feet when walking [] ?????????Pain in feet at rest [] ?????????Pain in feet when laying flat [] ?????????History of DVT [] ?????????Phlebitis [x] ?????????Swelling in legs [] ?????????Varicose veins [] ?????????Non-healing ulcers Pulmonary: [] ?????????Uses home oxygen [] ?????????Productive cough[] ?????????Hemoptysis [] ?????????Wheeze [] ?????????COPD [] ?????????Asthma Neurologic: [] ?????????Dizziness [] ?????????Blackouts [] ?????????Seizures [] ?????????History of stroke [] ?????????History of TIA[] ?????????Aphasia [] ?????????Temporary blindness[] ?????????Dysphagia [] ?????????Weaknessor numbness in arms [] ?????????Weakness or numbnessin legs Musculoskeletal: [x] ?????????Arthritis [] ?????????Joint swelling [] ?????????Joint pain [] ?????????Low back pain Hematologic:[] ?????????Easy bruising[] ?????????Easy bleeding [] ?????????Hypercoagulable state [] ?????????Anemic  Gastrointestinal:[] ?????????Blood in stool[] ?????????Vomiting blood[] ?????????Gastroesophageal reflux/heartburn[] ?????????Abdominal pain Genitourinary: [] ?????????Chronic kidney disease [] ?????????Difficulturination [] ?????????Frequenturination [] ?????????Burning with urination[] ?????????Hematuria Skin:  [x] ?????????Rashes [] ?????????Ulcers [] ?????????Wounds Psychological: [] ?????????History of anxiety[] ?????????History of major depression.    Physical Examination  BP (!) 145/86   Pulse 67   Ht 6' (1.829 m)  Wt 291 lb (132 kg)   BMI 39.47 kg/m  Gen:  WD/WN, NAD Head: [place]/AT, No temporalis wasting. Ear/Nose/Throat: Hearing grossly intact, nares w/o erythema or drainage Eyes: Conjunctiva clear. Sclera non-icteric Neck: Supple.  Trachea midline Pulmonary:  Good air movement, no use of accessory muscles.  Cardiac: RRR, no JVD Vascular:  Vessel Right Left  Radial Palpable Palpable                       Musculoskeletal: M/S 5/5 throughout.  No deformity or atrophy.  2-3+ bilateral lower extremity edema. Neurologic: Sensation grossly intact in extremities.  Symmetrical.  Speech is fluent.  Psychiatric: Judgment intact, Mood & affect appropriate for pt's clinical situation. Dermatologic: No rashes or ulcers noted.  No cellulitis or open wounds.       Labs No results found for this or any previous visit (from the past 2160 hour(s)).  Radiology No results found.  Assessment/Plan Essential hypertension, benign blood pressure control important in reducing the progression of atherosclerotic disease. On appropriate oral medications.   Cellulitis of leg, right Recent course of amoxil, improved  Swelling of limb Significantly improved with a few weeks in Unna boots.  Still prominent, but better  Lower limb ulcer, calf, right, with fat layer exposed (HCC) Skin has healed with several weeks in Unna boots.  Lymphedema At this point, he is getting a significant benefit from Unna boots and we will plan on doing that for at least another 4 weeks to see if he will continue to improve.  He has lost 9 pounds which is clearly fluid weight.  His legs are much better although his swelling is still prominent.  A 3 layer Unna boot was placed today.    Festus Barren,  MD  12/08/2019 10:33 AM    This note was created with Dragon medical transcription system.  Any errors from dictation are purely unintentional

## 2019-12-08 NOTE — Assessment & Plan Note (Signed)
Significantly improved with a few weeks in Unna boots.  Still prominent, but better

## 2019-12-08 NOTE — Assessment & Plan Note (Addendum)
At this point, he is getting a significant benefit from Unna boots and we will plan on doing that for at least another 4 weeks to see if he will continue to improve.  He has lost 9 pounds which is clearly fluid weight.  His legs are much better although his swelling is still prominent.  A 3 layer Unna boot was placed today.

## 2019-12-08 NOTE — Assessment & Plan Note (Signed)
Skin has healed with several weeks in Unna boots.

## 2019-12-15 ENCOUNTER — Other Ambulatory Visit: Payer: Self-pay

## 2019-12-15 ENCOUNTER — Ambulatory Visit (INDEPENDENT_AMBULATORY_CARE_PROVIDER_SITE_OTHER): Payer: Medicare Other | Admitting: Nurse Practitioner

## 2019-12-15 VITALS — BP 138/86 | HR 71 | Resp 17 | Ht 72.0 in | Wt 290.0 lb

## 2019-12-15 DIAGNOSIS — L97212 Non-pressure chronic ulcer of right calf with fat layer exposed: Secondary | ICD-10-CM | POA: Diagnosis not present

## 2019-12-15 NOTE — Progress Notes (Signed)
History of Present Illness  There is no documented history at this time  Assessments & Plan   There are no diagnoses linked to this encounter.    Additional instructions  Subjective:  Patient presents with venous ulcer of the Bilateral lower extremity.    Procedure:  3 layer unna wrap was placed Bilateral lower extremity.   Plan:   Follow up in one week.  

## 2019-12-20 ENCOUNTER — Encounter (INDEPENDENT_AMBULATORY_CARE_PROVIDER_SITE_OTHER): Payer: Self-pay | Admitting: Nurse Practitioner

## 2019-12-22 ENCOUNTER — Ambulatory Visit (INDEPENDENT_AMBULATORY_CARE_PROVIDER_SITE_OTHER): Payer: Medicare Other | Admitting: Nurse Practitioner

## 2019-12-22 ENCOUNTER — Other Ambulatory Visit: Payer: Self-pay

## 2019-12-22 ENCOUNTER — Encounter (INDEPENDENT_AMBULATORY_CARE_PROVIDER_SITE_OTHER): Payer: Self-pay | Admitting: Nurse Practitioner

## 2019-12-22 VITALS — BP 147/86 | HR 76 | Ht 72.0 in | Wt 290.0 lb

## 2019-12-22 DIAGNOSIS — L97212 Non-pressure chronic ulcer of right calf with fat layer exposed: Secondary | ICD-10-CM | POA: Diagnosis not present

## 2019-12-22 NOTE — Progress Notes (Signed)
History of Present Illness  There is no documented history at this time  Assessments & Plan   There are no diagnoses linked to this encounter.    Additional instructions  Subjective:  Patient presents with venous ulcer of the Bilateral lower extremity.    Procedure:  3 layer unna wrap was placed Bilateral lower extremity.   Plan:   Follow up in one week.2

## 2019-12-29 ENCOUNTER — Ambulatory Visit (INDEPENDENT_AMBULATORY_CARE_PROVIDER_SITE_OTHER): Payer: Medicare Other | Admitting: Vascular Surgery

## 2019-12-29 ENCOUNTER — Other Ambulatory Visit: Payer: Self-pay

## 2019-12-29 ENCOUNTER — Encounter (INDEPENDENT_AMBULATORY_CARE_PROVIDER_SITE_OTHER): Payer: Self-pay | Admitting: Vascular Surgery

## 2019-12-29 VITALS — BP 135/83 | HR 68 | Resp 16 | Wt 290.0 lb

## 2019-12-29 DIAGNOSIS — L03115 Cellulitis of right lower limb: Secondary | ICD-10-CM

## 2019-12-29 DIAGNOSIS — M7989 Other specified soft tissue disorders: Secondary | ICD-10-CM

## 2019-12-29 DIAGNOSIS — I890 Lymphedema, not elsewhere classified: Secondary | ICD-10-CM | POA: Diagnosis not present

## 2019-12-29 DIAGNOSIS — I10 Essential (primary) hypertension: Secondary | ICD-10-CM | POA: Diagnosis not present

## 2019-12-29 DIAGNOSIS — L97212 Non-pressure chronic ulcer of right calf with fat layer exposed: Secondary | ICD-10-CM

## 2019-12-29 NOTE — Assessment & Plan Note (Signed)
The Unna boots really have helped him in terms of swelling and pain in his legs.  Were going to continue these for another 4 weeks and then reassess.

## 2019-12-29 NOTE — Progress Notes (Signed)
[name] : [id number]  Warren Leblanc is a 74 y.o. ([birth year]) male who presents with chief complaint of  Chief Complaint  Patient presents with  . Follow-up    unna boot follow up  .  History of Present Illness: Patient returns today in follow up of his leg swelling and lymphedema. He is doing well. His legs are doing quite a bit better with the UNNA boots.  Down another pound or two.  Overall he was very pleased with his progress thus far with Unna boots.  Current Outpatient Medications  Medication Sig Dispense Refill  . ACIDOPHILUS LACTOBACILLUS PO Take by mouth.    . Ascorbic Acid (VITAMIN C) 1000 MG tablet Take by mouth.    Marland Kitchen aspirin EC 81 MG tablet Take 81 mg by mouth daily.    . Biotin 2.5 MG TABS Take by mouth.    . Calcium Ascorbate 500 MG TABS Take by mouth.    . Cyanocobalamin (VITAMIN B 12 PO) Take by mouth.    . Ergocalciferol 2000 units TABS Take by mouth.    . folic acid (FOLVITE) 1 MG tablet Take by mouth.    . furosemide (LASIX) 40 MG tablet Take by mouth as needed.     . Ginger Oil OIL by Does not apply route daily.    Marland Kitchen lisinopril-hydrochlorothiazide (ZESTORETIC) 20-12.5 MG tablet Take 2 tablets by mouth daily.     . Magnesium 100 MG CAPS Take by mouth.    Marland Kitchen MAGNESIUM OXIDE PO Take by mouth.    Darlin Priestly POWD Take one capsule by mouth twice daily.    . Potassium 99 MG TABS Take by mouth.    . Taurine 1000 MG CAPS Take by mouth.    . TURMERIC PO Take by mouth.    Marland Kitchen UNABLE TO FIND Carnivora     No current facility-administered medications for this visit.    Past Medical History:  Diagnosis Date  . Hypertension   Lymphedema  No past surgical history on file.       Social History  Substance Use Topics  . Smoking status: Current Some Day Smoker  . Smokeless tobacco: Never Used  . Alcohol use No   Family History No bleeding or clotting disorders      Allergies  Allergen Reactions  . Antihistamines, Chlorpheniramine-Type Nausea Only      REVIEW OF SYSTEMS(Negative unless checked)  Constitutional: [] ??????????Weight loss[] ??????????Fever[x] ??????????Chills Cardiac:[] ??????????Chest pain[] ??????????Chest pressure[] ??????????Palpitations [] ??????????Shortness of breath when laying flat [] ??????????Shortness of breath at rest [] ??????????Shortness of breath with exertion. Vascular: [x] ??????????Pain in legs with walking[] ??????????Pain in legsat rest[] ??????????Pain in legs when laying flat [] ??????????Claudication [] ??????????Pain in feet when walking [] ??????????Pain in feet at rest [] ??????????Pain in feet when laying flat [] ??????????History of DVT [] ??????????Phlebitis [x] ??????????Swelling in legs [] ??????????Varicose veins [] ??????????Non-healing ulcers Pulmonary: [] ??????????Uses home oxygen [] ??????????Productive cough[] ??????????Hemoptysis [] ??????????Wheeze [] ??????????COPD [] ??????????Asthma Neurologic: [] ??????????Dizziness [] ??????????Blackouts [] ??????????Seizures [] ??????????History of stroke [] ??????????History of TIA[] ??????????Aphasia [] ??????????Temporary blindness[] ??????????Dysphagia [] ??????????Weaknessor numbness in arms [] ??????????Weakness or numbnessin legs Musculoskeletal: [x] ??????????Arthritis [] ??????????Joint swelling [] ??????????Joint pain [] ??????????Low back pain Hematologic:[] ??????????Easy bruising[] ??????????Easy bleeding [] ??????????Hypercoagulable state [] ??????????Anemic  Gastrointestinal:[] ??????????Blood in stool[] ??????????Vomiting blood[] ??????????Gastroesophageal reflux/heartburn[] ??????????Abdominal pain Genitourinary: [] ??????????Chronic kidney disease [] ??????????Difficulturination [] ??????????Frequenturination [] ??????????Burning with urination[] ??????????Hematuria Skin: [x] ??????????Rashes [] ??????????Ulcers [] ??????????Wounds Psychological: [] ??????????History of  anxiety[] ??????????History of major depression.    Physical Examination  BP 135/83 (BP Location: Left Arm)   Pulse 68   Resp 16   Wt 290 lb (131.5 kg)   BMI 39.33 kg/m  Gen:  WD/WN, NAD Head: [place]/AT, No temporalis wasting. Ear/Nose/Throat: Hearing grossly intact, nares w/o erythema or  drainage Eyes: Conjunctiva clear. Sclera non-icteric Neck: Supple.  Trachea midline Pulmonary:  Good air movement, no use of accessory muscles.  Cardiac: RRR, no JVD Vascular:  Vessel Right Left  Radial Palpable Palpable       Musculoskeletal: M/S 5/5 throughout.  No deformity or atrophy. 2+ BLE edema, a little more on the left than the right. Neurologic: Sensation grossly intact in extremities.  Symmetrical.  Speech is fluent.  Psychiatric: Judgment intact, Mood & affect appropriate for pt's clinical situation. Dermatologic: No rashes or ulcers noted.  No cellulitis or open wounds.       Labs No results found for this or any previous visit (from the past 2160 hour(s)).  Radiology No results found.  Assessment/Plan Essential hypertension, benign blood pressure control important in reducing the progression of atherosclerotic disease. On appropriate oral medications.   Cellulitis of leg, right Recent course of amoxil, improved  Swelling of limb Significantly improved with a few weeks in Unna boots.  Still prominent, but better  Lower limb ulcer, calf, right, with fat layer exposed (HCC) Skin has healed with several weeks in Unna boots.  Lymphedema The Unna boots really have helped him in terms of swelling and pain in his legs.  Were going to continue these for another 4 weeks and then reassess.    Festus Barren, MD  12/29/2019 2:32 PM    This note was created with Dragon medical transcription system.  Any errors from dictation are purely unintentional

## 2020-01-05 ENCOUNTER — Other Ambulatory Visit: Payer: Self-pay

## 2020-01-05 ENCOUNTER — Encounter (INDEPENDENT_AMBULATORY_CARE_PROVIDER_SITE_OTHER): Payer: Self-pay

## 2020-01-05 ENCOUNTER — Ambulatory Visit (INDEPENDENT_AMBULATORY_CARE_PROVIDER_SITE_OTHER): Payer: Medicare Other | Admitting: Nurse Practitioner

## 2020-01-05 VITALS — BP 153/93 | HR 60 | Resp 16 | Wt 294.0 lb

## 2020-01-05 DIAGNOSIS — L97212 Non-pressure chronic ulcer of right calf with fat layer exposed: Secondary | ICD-10-CM

## 2020-01-05 NOTE — Progress Notes (Signed)
History of Present Illness  There is no documented history at this time  Assessments & Plan   There are no diagnoses linked to this encounter.    Additional instructions  Subjective:  Patient presents with venous ulcer of the Bilateral lower extremity.    Procedure:  3 layer unna wrap was placed Bilateral lower extremity.   Plan:   Follow up in one week.  

## 2020-01-07 ENCOUNTER — Encounter (INDEPENDENT_AMBULATORY_CARE_PROVIDER_SITE_OTHER): Payer: Self-pay | Admitting: Nurse Practitioner

## 2020-01-12 ENCOUNTER — Other Ambulatory Visit: Payer: Self-pay

## 2020-01-12 ENCOUNTER — Ambulatory Visit (INDEPENDENT_AMBULATORY_CARE_PROVIDER_SITE_OTHER): Payer: Medicare Other | Admitting: Nurse Practitioner

## 2020-01-12 VITALS — BP 148/92 | HR 61 | Ht 72.0 in | Wt 287.0 lb

## 2020-01-12 DIAGNOSIS — L97212 Non-pressure chronic ulcer of right calf with fat layer exposed: Secondary | ICD-10-CM | POA: Diagnosis not present

## 2020-01-12 NOTE — Progress Notes (Signed)
History of Present Illness  There is no documented history at this time  Assessments & Plan   There are no diagnoses linked to this encounter.    Additional instructions  Subjective:  Patient presents with venous ulcer of the Bilateral lower extremity.    Procedure:  3 layer unna wrap was placed Bilateral lower extremity.   Plan:   Follow up in one week.  

## 2020-01-17 ENCOUNTER — Encounter (INDEPENDENT_AMBULATORY_CARE_PROVIDER_SITE_OTHER): Payer: Self-pay | Admitting: Nurse Practitioner

## 2020-01-19 ENCOUNTER — Ambulatory Visit (INDEPENDENT_AMBULATORY_CARE_PROVIDER_SITE_OTHER): Payer: Medicare Other | Admitting: Nurse Practitioner

## 2020-01-19 ENCOUNTER — Encounter (INDEPENDENT_AMBULATORY_CARE_PROVIDER_SITE_OTHER): Payer: Self-pay

## 2020-01-19 ENCOUNTER — Other Ambulatory Visit: Payer: Self-pay

## 2020-01-19 VITALS — BP 136/81 | HR 64 | Resp 16 | Wt 283.0 lb

## 2020-01-19 DIAGNOSIS — L97212 Non-pressure chronic ulcer of right calf with fat layer exposed: Secondary | ICD-10-CM | POA: Diagnosis not present

## 2020-01-19 NOTE — Progress Notes (Signed)
History of Present Illness  There is no documented history at this time  Assessments & Plan   There are no diagnoses linked to this encounter.    Additional instructions  Subjective:  Patient presents with venous ulcer of the Bilateral lower extremity.    Procedure:  3 layer unna wrap was placed Bilateral lower extremity.   Plan:   Follow up in one week.  

## 2020-01-25 ENCOUNTER — Encounter (INDEPENDENT_AMBULATORY_CARE_PROVIDER_SITE_OTHER): Payer: Self-pay | Admitting: Nurse Practitioner

## 2020-01-26 ENCOUNTER — Ambulatory Visit (INDEPENDENT_AMBULATORY_CARE_PROVIDER_SITE_OTHER): Payer: Medicare Other | Admitting: Nurse Practitioner

## 2020-01-26 ENCOUNTER — Other Ambulatory Visit: Payer: Self-pay

## 2020-01-26 VITALS — BP 151/91 | HR 66 | Ht 72.0 in | Wt 285.0 lb

## 2020-01-26 DIAGNOSIS — L97212 Non-pressure chronic ulcer of right calf with fat layer exposed: Secondary | ICD-10-CM | POA: Diagnosis not present

## 2020-01-26 NOTE — Progress Notes (Signed)
History of Present Illness  There is no documented history at this time  Assessments & Plan   There are no diagnoses linked to this encounter.    Additional instructions  Subjective:  Patient presents with venous ulcer of the Bilateral lower extremity.    Procedure:  3 layer unna wrap was placed Bilateral lower extremity.   Plan:   Follow up in one week.  

## 2020-01-31 ENCOUNTER — Encounter (INDEPENDENT_AMBULATORY_CARE_PROVIDER_SITE_OTHER): Payer: Self-pay | Admitting: Nurse Practitioner

## 2020-02-02 ENCOUNTER — Encounter (INDEPENDENT_AMBULATORY_CARE_PROVIDER_SITE_OTHER): Payer: Self-pay | Admitting: Nurse Practitioner

## 2020-02-02 ENCOUNTER — Other Ambulatory Visit: Payer: Self-pay

## 2020-02-02 ENCOUNTER — Ambulatory Visit (INDEPENDENT_AMBULATORY_CARE_PROVIDER_SITE_OTHER): Payer: Medicare Other | Admitting: Nurse Practitioner

## 2020-02-02 VITALS — BP 143/87 | HR 68 | Resp 16 | Ht 72.0 in | Wt 286.0 lb

## 2020-02-02 DIAGNOSIS — L97212 Non-pressure chronic ulcer of right calf with fat layer exposed: Secondary | ICD-10-CM

## 2020-02-02 NOTE — Progress Notes (Signed)
History of Present Illness  There is no documented history at this time  Assessments & Plan   There are no diagnoses linked to this encounter.    Additional instructions  Subjective:  Patient presents with venous ulcer of the Bilateral lower extremity.    Procedure:  3 layer unna wrap was placed Bilateral lower extremity.   Plan:   Follow up in one week.  

## 2020-02-09 ENCOUNTER — Encounter (INDEPENDENT_AMBULATORY_CARE_PROVIDER_SITE_OTHER): Payer: Self-pay | Admitting: Vascular Surgery

## 2020-02-09 ENCOUNTER — Other Ambulatory Visit: Payer: Self-pay

## 2020-02-09 ENCOUNTER — Ambulatory Visit (INDEPENDENT_AMBULATORY_CARE_PROVIDER_SITE_OTHER): Payer: Medicare Other | Admitting: Vascular Surgery

## 2020-02-09 VITALS — BP 138/85 | HR 64 | Ht 72.0 in | Wt 283.0 lb

## 2020-02-09 DIAGNOSIS — M7989 Other specified soft tissue disorders: Secondary | ICD-10-CM

## 2020-02-09 DIAGNOSIS — I890 Lymphedema, not elsewhere classified: Secondary | ICD-10-CM | POA: Diagnosis not present

## 2020-02-09 DIAGNOSIS — L03115 Cellulitis of right lower limb: Secondary | ICD-10-CM | POA: Diagnosis not present

## 2020-02-09 DIAGNOSIS — I10 Essential (primary) hypertension: Secondary | ICD-10-CM | POA: Diagnosis not present

## 2020-02-09 NOTE — Assessment & Plan Note (Signed)
We have seen significant improvement with a couple of months and Unna boots now.  The swelling is still significant and he is now having more swelling in his thigh and pelvis area, but his calves, ankles, and feet are significantly better.  We are going to continue the Unna boots until we no longer see progress and then go back to compression socks.  We will plan to see him back in about 4 weeks but keep doing the Unna boots weekly.

## 2020-02-09 NOTE — Progress Notes (Signed)
[name] : [id number]  Warren Leblanc is a 75 y.o. (07-Sep-1945) male who presents with chief complaint of  Chief Complaint  Patient presents with  . Follow-up    BIL unna boot check   .  History of Present Illness: Patient returns today in follow up of his lymphedema.  He continues to see improvement in his lower leg, foot and ankle swelling with Unna boots we have now been in for a couple of months.  He is having a little more swelling up in his thigh and pelvis area but overall things are much better.  He is also lost about 17 pounds of fluid weight.  No new ulceration or infection.  No fevers or chills.  Current Outpatient Medications  Medication Sig Dispense Refill  . ACIDOPHILUS LACTOBACILLUS PO Take by mouth.    Marland Kitchen amoxicillin (AMOXIL) 500 MG capsule amoxicillin 500 mg capsule    . Ascorbic Acid (VITAMIN C) 1000 MG tablet Take by mouth.    Marland Kitchen aspirin EC 81 MG tablet Take 81 mg by mouth daily.    . Biotin 2.5 MG TABS Take by mouth.    . Calcium Ascorbate 500 MG TABS Take by mouth.    . Cyanocobalamin (VITAMIN B 12 PO) Take by mouth.    . Ergocalciferol 2000 units TABS Take by mouth.    . folic acid (FOLVITE) 1 MG tablet Take by mouth.    . furosemide (LASIX) 40 MG tablet Take by mouth as needed.     . Ginger Oil OIL by Does not apply route daily.    Marland Kitchen lisinopril-hydrochlorothiazide (ZESTORETIC) 20-12.5 MG tablet Take 2 tablets by mouth daily.     . Magnesium 100 MG CAPS Take by mouth.    Marland Kitchen MAGNESIUM OXIDE PO Take by mouth.    Darlin Priestly POWD Take one capsule by mouth twice daily.    . Potassium 99 MG TABS Take by mouth.    . Taurine 1000 MG CAPS Take by mouth.    . TURMERIC PO Take by mouth.    Marland Kitchen UNABLE TO FIND Carnivora     No current facility-administered medications for this visit.    Past Medical History:  Diagnosis Date  . Hypertension     No past surgical history on file.   Social History   Tobacco Use  . Smoking status: Current Some Day Smoker  . Smokeless  tobacco: Never Used  Substance Use Topics  . Alcohol use: No  . Drug use: No     Family History No bleeding or clotting disorders  Allergies  Allergen Reactions  . Amoxicillin   . Amoxicillin-Pot Clavulanate   . Antihistamines, Chlorpheniramine-Type Nausea Only  . Diphenhydramine Hcl     Other reaction(s): Other (See Comments) drowsy     REVIEW OF SYSTEMS (Negative unless checked)  Constitutional: [] Weight loss  [] Fever  [] Chills Cardiac: [] Chest pain   [] Chest pressure   [] Palpitations   [] Shortness of breath when laying flat   [] Shortness of breath at rest   [] Shortness of breath with exertion. Vascular:  [] Pain in legs with walking   [] Pain in legs at rest   [] Pain in legs when laying flat   [] Claudication   [] Pain in feet when walking  [] Pain in feet at rest  [] Pain in feet when laying flat   [] History of DVT   [] Phlebitis   [x] Swelling in legs   [] Varicose veins   [] Non-healing ulcers Pulmonary:   [] Uses home oxygen   [] Productive  cough   [] Hemoptysis   [] Wheeze  [] COPD   [] Asthma Neurologic:  [] Dizziness  [] Blackouts   [] Seizures   [] History of stroke   [] History of TIA  [] Aphasia   [] Temporary blindness   [] Dysphagia   [] Weakness or numbness in arms   [] Weakness or numbness in legs Musculoskeletal:  [x] Arthritis   [] Joint swelling   [] Joint pain   [] Low back pain Hematologic:  [] Easy bruising  [] Easy bleeding   [] Hypercoagulable state   [] Anemic   Gastrointestinal:  [] Blood in stool   [] Vomiting blood  [] Gastroesophageal reflux/heartburn   [] Abdominal pain Genitourinary:  [] Chronic kidney disease   [] Difficult urination  [] Frequent urination  [] Burning with urination   [] Hematuria Skin:  [x] Rashes   [] Ulcers   [] Wounds Psychological:  [] History of anxiety   []  History of major depression.  Physical Examination  BP 138/85   Pulse 64   Ht 6' (1.829 m)   Wt 283 lb (128.4 kg)   BMI 38.38 kg/m  Gen:  WD/WN, NAD Head: [place]/AT, No temporalis wasting. Ear/Nose/Throat:  Hearing grossly intact, nares w/o erythema or drainage Eyes: Conjunctiva clear. Sclera non-icteric Neck: Supple.  Trachea midline Pulmonary:  Good air movement, no use of accessory muscles.  Cardiac: RRR, no JVD Vascular:  Vessel Right Left  Radial Palpable Palpable                       Musculoskeletal: M/S 5/5 throughout.  No deformity or atrophy. 1-2+ RLE edema, 2+ LLE edema. Neurologic: Sensation grossly intact in extremities.  Symmetrical.  Speech is fluent.  Psychiatric: Judgment intact, Mood & affect appropriate for pt's clinical situation. Dermatologic: No rashes or ulcers noted.  No cellulitis or open wounds.       Labs No results found for this or any previous visit (from the past 2160 hour(s)).  Radiology No results found.  Assessment/Plan Essential hypertension, benign blood pressure control important in reducing the progression of atherosclerotic disease. On appropriate oral medications.   Cellulitis of leg, right Recent course of amoxil, improved  Swelling of limb Significantly improved in Unna boots. Still prominent, but better  Lymphedema We have seen significant improvement with a couple of months and Unna boots now.  The swelling is still significant and he is now having more swelling in his thigh and pelvis area, but his calves, ankles, and feet are significantly better.  We are going to continue the Unna boots until we no longer see progress and then go back to compression socks.  We will plan to see him back in about 4 weeks but keep doing the Unna boots weekly.    , MD  02/09/2020 12:40 PM    This note was created with Dragon medical transcription system.  Any errors from dictation are purely unintentional

## 2020-02-16 ENCOUNTER — Encounter (INDEPENDENT_AMBULATORY_CARE_PROVIDER_SITE_OTHER): Payer: Self-pay

## 2020-02-16 ENCOUNTER — Ambulatory Visit (INDEPENDENT_AMBULATORY_CARE_PROVIDER_SITE_OTHER): Payer: Medicare Other | Admitting: Nurse Practitioner

## 2020-02-16 ENCOUNTER — Other Ambulatory Visit: Payer: Self-pay

## 2020-02-16 VITALS — BP 149/89 | HR 65 | Resp 16 | Wt 285.0 lb

## 2020-02-16 DIAGNOSIS — L97212 Non-pressure chronic ulcer of right calf with fat layer exposed: Secondary | ICD-10-CM

## 2020-02-16 NOTE — Progress Notes (Signed)
History of Present Illness  There is no documented history at this time  Assessments & Plan   There are no diagnoses linked to this encounter.    Additional instructions  Subjective:  Patient presents with venous ulcer of the Bilateral lower extremity.    Procedure:  3 layer unna wrap was placed Bilateral lower extremity.   Plan:   Follow up in one week.  

## 2020-02-23 ENCOUNTER — Ambulatory Visit (INDEPENDENT_AMBULATORY_CARE_PROVIDER_SITE_OTHER): Payer: Medicare Other | Admitting: Nurse Practitioner

## 2020-02-23 ENCOUNTER — Other Ambulatory Visit: Payer: Self-pay

## 2020-02-23 ENCOUNTER — Encounter (INDEPENDENT_AMBULATORY_CARE_PROVIDER_SITE_OTHER): Payer: Self-pay

## 2020-02-23 VITALS — BP 148/89 | HR 71 | Resp 16 | Wt 288.0 lb

## 2020-02-23 DIAGNOSIS — L97212 Non-pressure chronic ulcer of right calf with fat layer exposed: Secondary | ICD-10-CM

## 2020-02-23 NOTE — Progress Notes (Signed)
History of Present Illness  There is no documented history at this time  Assessments & Plan   There are no diagnoses linked to this encounter.    Additional instructions  Subjective:  Patient presents with venous ulcer of the Bilateral lower extremity.    Procedure:  3 layer unna wrap was placed Bilateral lower extremity.   Plan:   Follow up in one week.  

## 2020-03-01 ENCOUNTER — Encounter (INDEPENDENT_AMBULATORY_CARE_PROVIDER_SITE_OTHER): Payer: Self-pay | Admitting: Nurse Practitioner

## 2020-03-01 ENCOUNTER — Ambulatory Visit (INDEPENDENT_AMBULATORY_CARE_PROVIDER_SITE_OTHER): Payer: Medicare Other | Admitting: Nurse Practitioner

## 2020-03-01 ENCOUNTER — Other Ambulatory Visit: Payer: Self-pay

## 2020-03-01 VITALS — BP 133/81 | HR 70 | Resp 16 | Wt 283.0 lb

## 2020-03-01 DIAGNOSIS — L97212 Non-pressure chronic ulcer of right calf with fat layer exposed: Secondary | ICD-10-CM | POA: Diagnosis not present

## 2020-03-01 NOTE — Progress Notes (Signed)
History of Present Illness  There is no documented history at this time  Assessments & Plan   1. Lower limb ulcer, calf, right, with fat layer exposed (HCC)      Additional instructions  Subjective:  Patient presents with venous ulcer of the Bilateral lower extremity.    Procedure:  3 layer unna wrap was placed Bilateral lower extremity.   Plan:   Follow up in one week.

## 2020-03-08 ENCOUNTER — Other Ambulatory Visit: Payer: Self-pay

## 2020-03-08 ENCOUNTER — Ambulatory Visit (INDEPENDENT_AMBULATORY_CARE_PROVIDER_SITE_OTHER): Payer: Medicare Other | Admitting: Nurse Practitioner

## 2020-03-08 VITALS — BP 149/92 | HR 71 | Ht 72.0 in | Wt 283.0 lb

## 2020-03-08 DIAGNOSIS — L97212 Non-pressure chronic ulcer of right calf with fat layer exposed: Secondary | ICD-10-CM

## 2020-03-08 NOTE — Progress Notes (Signed)
History of Present Illness  There is no documented history at this time  Assessments & Plan   There are no diagnoses linked to this encounter.    Additional instructions  Subjective:  Patient presents with venous ulcer of the Bilateral lower extremity.    Procedure:  3 layer unna wrap was placed Bilateral lower extremity.   Plan:   Follow up in one week.  

## 2020-03-12 ENCOUNTER — Encounter (INDEPENDENT_AMBULATORY_CARE_PROVIDER_SITE_OTHER): Payer: Self-pay | Admitting: Nurse Practitioner

## 2020-03-15 ENCOUNTER — Encounter (INDEPENDENT_AMBULATORY_CARE_PROVIDER_SITE_OTHER): Payer: Self-pay | Admitting: Vascular Surgery

## 2020-03-15 ENCOUNTER — Ambulatory Visit (INDEPENDENT_AMBULATORY_CARE_PROVIDER_SITE_OTHER): Payer: Medicare Other | Admitting: Vascular Surgery

## 2020-03-15 ENCOUNTER — Other Ambulatory Visit: Payer: Self-pay

## 2020-03-15 VITALS — BP 148/86 | HR 71 | Resp 16 | Wt 281.5 lb

## 2020-03-15 DIAGNOSIS — I10 Essential (primary) hypertension: Secondary | ICD-10-CM

## 2020-03-15 DIAGNOSIS — L03115 Cellulitis of right lower limb: Secondary | ICD-10-CM

## 2020-03-15 DIAGNOSIS — M7989 Other specified soft tissue disorders: Secondary | ICD-10-CM | POA: Diagnosis not present

## 2020-03-15 DIAGNOSIS — I890 Lymphedema, not elsewhere classified: Secondary | ICD-10-CM | POA: Diagnosis not present

## 2020-03-15 NOTE — Assessment & Plan Note (Signed)
The patient has developed significant improvement with several months and Unna boots. Were going to continue weekly Unna boot wraps. I will reassess him in 4 weeks. As long as he is making progress, I think it makes sense to continue with Unna boot therapies.

## 2020-03-15 NOTE — Progress Notes (Signed)
[name] : [id number]  Warren Leblanc is a 75 y.o. (February 24, 1945) male who presents with chief complaint of  Chief Complaint  Patient presents with  . Follow-up    Unna wrap check  .  History of Present Illness: Patient returns today in follow up of his severe lymphedema and leg swelling. He has been getting Unna boot wraps now for several months. He is now lost almost 20 pounds and has lost 2 pounds since his last visit. This is largely fluid weight. His leg swelling continues to improve although it is still prominent. He asked today about using a massage gun to try to break up some of the swelling in his thighs and upper legs which I think is reasonable. No new wounds or infection.  Current Outpatient Medications  Medication Sig Dispense Refill  . ACIDOPHILUS LACTOBACILLUS PO Take by mouth.    Marland Kitchen amoxicillin (AMOXIL) 500 MG capsule amoxicillin 500 mg capsule    . Ascorbic Acid (VITAMIN C) 1000 MG tablet Take by mouth.    Marland Kitchen aspirin EC 81 MG tablet Take 81 mg by mouth daily.    . Biotin 2.5 MG TABS Take by mouth.    . Calcium Ascorbate 500 MG TABS Take by mouth.    . Cyanocobalamin (VITAMIN B 12 PO) Take by mouth.    . Ergocalciferol 2000 units TABS Take by mouth.    . folic acid (FOLVITE) 1 MG tablet Take by mouth.    . furosemide (LASIX) 40 MG tablet Take by mouth as needed.     . Ginger Oil OIL by Does not apply route daily.    Marland Kitchen lisinopril-hydrochlorothiazide (ZESTORETIC) 20-12.5 MG tablet Take 2 tablets by mouth daily.     . Magnesium 100 MG CAPS Take by mouth.    Marland Kitchen MAGNESIUM OXIDE PO Take by mouth.    Darlin Priestly POWD Take one capsule by mouth twice daily.    . Potassium 99 MG TABS Take by mouth.    . Taurine 1000 MG CAPS Take by mouth.    . TURMERIC PO Take by mouth.    Marland Kitchen UNABLE TO FIND Carnivora     No current facility-administered medications for this visit.    Past Medical History:  Diagnosis Date  . Hypertension     No past surgical history on file.   Social History    Tobacco Use  . Smoking status: Current Some Day Smoker  . Smokeless tobacco: Never Used  Substance Use Topics  . Alcohol use: No  . Drug use: No      No family history on file.   Allergies  Allergen Reactions  . Amoxicillin   . Amoxicillin-Pot Clavulanate   . Antihistamines, Chlorpheniramine-Type Nausea Only  . Diphenhydramine Hcl     Other reaction(s): Other (See Comments) drowsy     REVIEW OF SYSTEMS (Negative unless checked)  Constitutional: [] ?Weight loss  [] ?Fever  [] ?Chills Cardiac: [] ?Chest pain   [] ?Chest pressure   [] ?Palpitations   [] ?Shortness of breath when laying flat   [] ?Shortness of breath at rest   [] ?Shortness of breath with exertion. Vascular:  [] ?Pain in legs with walking   [] ?Pain in legs at rest   [] ?Pain in legs when laying flat   [] ?Claudication   [] ?Pain in feet when walking  [] ?Pain in feet at rest  [] ?Pain in feet when laying flat   [] ?History of DVT   [] ?Phlebitis   [x] ?Swelling in legs   [] ?Varicose veins   [] ?Non-healing ulcers Pulmonary:   [] ?  Uses home oxygen   [] ?Productive cough   [] ?Hemoptysis   [] ?Wheeze  [] ?COPD   [] ?Asthma Neurologic:  [] ?Dizziness  [] ?Blackouts   [] ?Seizures   [] ?History of stroke   [] ?History of TIA  [] ?Aphasia   [] ?Temporary blindness   [] ?Dysphagia   [] ?Weakness or numbness in arms   [] ?Weakness or numbness in legs Musculoskeletal:  [x] ?Arthritis   [] ?Joint swelling   [] ?Joint pain   [] ?Low back pain Hematologic:  [] ?Easy bruising  [] ?Easy bleeding   [] ?Hypercoagulable state   [] ?Anemic   Gastrointestinal:  [] ?Blood in stool   [] ?Vomiting blood  [] ?Gastroesophageal reflux/heartburn   [] ?Abdominal pain Genitourinary:  [] ?Chronic kidney disease   [] ?Difficult urination  [] ?Frequent urination  [] ?Burning with urination   [] ?Hematuria Skin:  [x] ?Rashes   [] ?Ulcers   [] ?Wounds Psychological:  [] ?History of anxiety   [] ? History of major depression.  Physical Examination  BP (!) 148/86 (BP Location: Left Arm)   Pulse 71    Resp 16   Wt 281 lb 8 oz (127.7 kg)   BMI 38.18 kg/m  Gen:  WD/WN, NAD Head: [place]/AT, No temporalis wasting. Ear/Nose/Throat: Hearing grossly intact, nares w/o erythema or drainage Eyes: Conjunctiva clear. Sclera non-icteric Neck: Supple.  Trachea midline Pulmonary:  Good air movement, no use of accessory muscles.  Cardiac: RRR, no JVD Vascular:  Vessel Right Left  Radial Palpable Palpable           Musculoskeletal: M/S 5/5 throughout.  No deformity or atrophy. 1+ right lower extremity edema, 2+ left lower extremity edema. Neurologic: Sensation grossly intact in extremities.  Symmetrical.  Speech is fluent.  Psychiatric: Judgment intact, Mood & affect appropriate for pt's clinical situation. Dermatologic: No rashes or ulcers noted.  No cellulitis or open wounds.       Labs No results found for this or any previous visit (from the past 2160 hour(s)).  Radiology No results found.  Assessment/Plan Essential hypertension, benign blood pressure control important in reducing the progression of atherosclerotic disease. On appropriate oral medications.   Cellulitis of leg, right A few months ago, had a course of amoxil, improved  Swelling of limb Significantly improved in Unna boots. Still prominent, but better  Lymphedema The patient has developed significant improvement with several months and Unna boots. Were going to continue weekly Unna boot wraps. I will reassess him in 4 weeks. As long as he is making progress, I think it makes sense to continue with Unna boot therapies.    , MD  03/15/2020 11:42 AM    This note was created with Dragon medical transcription system.  Any errors from dictation are purely unintentional

## 2020-03-22 ENCOUNTER — Ambulatory Visit (INDEPENDENT_AMBULATORY_CARE_PROVIDER_SITE_OTHER): Payer: Medicare Other | Admitting: Nurse Practitioner

## 2020-03-22 ENCOUNTER — Other Ambulatory Visit: Payer: Self-pay

## 2020-03-22 ENCOUNTER — Encounter (INDEPENDENT_AMBULATORY_CARE_PROVIDER_SITE_OTHER): Payer: Self-pay

## 2020-03-22 VITALS — BP 146/89 | HR 61 | Resp 16 | Wt 285.0 lb

## 2020-03-22 DIAGNOSIS — L97212 Non-pressure chronic ulcer of right calf with fat layer exposed: Secondary | ICD-10-CM

## 2020-03-22 NOTE — Progress Notes (Signed)
History of Present Illness  There is no documented history at this time  Assessments & Plan   There are no diagnoses linked to this encounter.    Additional instructions  Subjective:  Patient presents with venous ulcer of the Bilateral lower extremity.    Procedure:  3 layer unna wrap was placed Bilateral lower extremity.   Plan:   Follow up in one week.  

## 2020-03-29 ENCOUNTER — Other Ambulatory Visit: Payer: Self-pay

## 2020-03-29 ENCOUNTER — Ambulatory Visit (INDEPENDENT_AMBULATORY_CARE_PROVIDER_SITE_OTHER): Payer: Medicare Other | Admitting: Nurse Practitioner

## 2020-03-29 VITALS — BP 141/85 | HR 77 | Ht 72.0 in | Wt 287.0 lb

## 2020-03-29 DIAGNOSIS — L97212 Non-pressure chronic ulcer of right calf with fat layer exposed: Secondary | ICD-10-CM | POA: Diagnosis not present

## 2020-03-29 NOTE — Progress Notes (Signed)
History of Present Illness  There is no documented history at this time  Assessments & Plan   There are no diagnoses linked to this encounter.    Additional instructions  Subjective:  Patient presents with venous ulcer of the Bilateral lower extremity.    Procedure:  3 layer unna wrap was placed Bilateral lower extremity.   Plan:   Follow up in one week.  

## 2020-04-03 ENCOUNTER — Encounter (INDEPENDENT_AMBULATORY_CARE_PROVIDER_SITE_OTHER): Payer: Self-pay | Admitting: Nurse Practitioner

## 2020-04-05 ENCOUNTER — Ambulatory Visit (INDEPENDENT_AMBULATORY_CARE_PROVIDER_SITE_OTHER): Payer: Medicare Other | Admitting: Vascular Surgery

## 2020-04-05 ENCOUNTER — Other Ambulatory Visit: Payer: Self-pay

## 2020-04-05 VITALS — BP 119/79 | HR 79 | Ht 72.0 in | Wt 286.0 lb

## 2020-04-05 DIAGNOSIS — L03115 Cellulitis of right lower limb: Secondary | ICD-10-CM

## 2020-04-05 DIAGNOSIS — I890 Lymphedema, not elsewhere classified: Secondary | ICD-10-CM

## 2020-04-05 DIAGNOSIS — I10 Essential (primary) hypertension: Secondary | ICD-10-CM | POA: Diagnosis not present

## 2020-04-05 DIAGNOSIS — M7989 Other specified soft tissue disorders: Secondary | ICD-10-CM

## 2020-04-05 NOTE — Progress Notes (Signed)
[name] : [id number]  Warren Leblanc is a 75 y.o. (1945/08/08) male who presents with chief complaint of  Chief Complaint  Patient presents with  . Follow-up    4 wk BIL unna boot check  .  History of Present Illness: Patient returns today in follow up of his lymphedema. He has been in [employer] for several months and is seeing improvement. His feet are a little more swollen this time than previous visits. He is happy with the steady improvement we have had in UNNA boots in general.  Current Outpatient Medications  Medication Sig Dispense Refill  . ACIDOPHILUS LACTOBACILLUS PO Take by mouth.    Marland Kitchen amoxicillin (AMOXIL) 500 MG capsule amoxicillin 500 mg capsule    . Ascorbic Acid (VITAMIN C) 1000 MG tablet Take by mouth.    Marland Kitchen aspirin EC 81 MG tablet Take 81 mg by mouth daily.    . Biotin 2.5 MG TABS Take by mouth.    . Calcium Ascorbate 500 MG TABS Take by mouth.    . Cyanocobalamin (VITAMIN B 12 PO) Take by mouth.    . Ergocalciferol 2000 units TABS Take by mouth.    . folic acid (FOLVITE) 1 MG tablet Take by mouth.    . furosemide (LASIX) 40 MG tablet Take by mouth as needed.     . Ginger Oil OIL by Does not apply route daily.    Marland Kitchen lisinopril-hydrochlorothiazide (ZESTORETIC) 20-12.5 MG tablet Take 2 tablets by mouth daily.     . Magnesium 100 MG CAPS Take by mouth.    Marland Kitchen MAGNESIUM OXIDE PO Take by mouth.    Darlin Priestly POWD Take one capsule by mouth twice daily.    . Potassium 99 MG TABS Take by mouth.    . Taurine 1000 MG CAPS Take by mouth.    . TURMERIC PO Take by mouth.    Marland Kitchen UNABLE TO FIND Carnivora     No current facility-administered medications for this visit.    Past Medical History:  Diagnosis Date  . Hypertension     No past surgical history on file.   Social History   Tobacco Use  . Smoking status: Current Some Day Smoker  . Smokeless tobacco: Never Used  Substance Use Topics  . Alcohol use: No  . Drug use: No      Family History No bleeding or  clotting disorders  Allergies  Allergen Reactions  . Amoxicillin   . Amoxicillin-Pot Clavulanate   . Antihistamines, Chlorpheniramine-Type Nausea Only  . Diphenhydramine Hcl     Other reaction(s): Other (See Comments) drowsy     REVIEW OF SYSTEMS (Negative unless checked)  Constitutional: [] Weight loss  [] Fever  [] Chills Cardiac: [] Chest pain   [] Chest pressure   [] Palpitations   [] Shortness of breath when laying flat   [] Shortness of breath at rest   [] Shortness of breath with exertion. Vascular:  [] Pain in legs with walking   [] Pain in legs at rest   [] Pain in legs when laying flat   [] Claudication   [] Pain in feet when walking  [] Pain in feet at rest  [] Pain in feet when laying flat   [] History of DVT   [] Phlebitis   [x] Swelling in legs   [] Varicose veins   [] Non-healing ulcers Pulmonary:   [] Uses home oxygen   [] Productive cough   [] Hemoptysis   [] Wheeze  [] COPD   [] Asthma Neurologic:  [] Dizziness  [] Blackouts   [] Seizures   [] History of stroke   [] History  of TIA  [] Aphasia   [] Temporary blindness   [] Dysphagia   [] Weakness or numbness in arms   [] Weakness or numbness in legs Musculoskeletal:  [x] Arthritis   [] Joint swelling   [x] Joint pain   [] Low back pain Hematologic:  [] Easy bruising  [] Easy bleeding   [] Hypercoagulable state   [] Anemic   Gastrointestinal:  [] Blood in stool   [] Vomiting blood  [] Gastroesophageal reflux/heartburn   [] Abdominal pain Genitourinary:  [] Chronic kidney disease   [] Difficult urination  [] Frequent urination  [] Burning with urination   [] Hematuria Skin:  [x] Rashes   [] Ulcers   [] Wounds Psychological:  [] History of anxiety   []  History of major depression.  Physical Examination  BP 119/79   Pulse 79   Ht 6' (1.829 m)   Wt 286 lb (129.7 kg)   BMI 38.79 kg/m  Gen:  WD/WN, NAD Head: [place]/AT, No temporalis wasting. Ear/Nose/Throat: Hearing grossly intact, nares w/o erythema or drainage Eyes: Conjunctiva clear. Sclera non-icteric Neck: Supple.  Trachea  midline Pulmonary:  Good air movement, no use of accessory muscles.  Cardiac: RRR, no JVD Vascular:  Vessel Right Left  Radial Palpable Palpable       Musculoskeletal: M/S 5/5 throughout.  No deformity or atrophy. 1-2+ RLE edema, 2+ LLE edema. Neurologic: Sensation grossly intact in extremities.  Symmetrical.  Speech is fluent.  Psychiatric: Judgment intact, Mood & affect appropriate for pt's clinical situation. Dermatologic: No rashes or ulcers noted.  No cellulitis or open wounds.       Labs No results found for this or any previous visit (from the past 2160 hour(s)).  Radiology No results found.  Assessment/Plan Essential hypertension, benign blood pressure control important in reducing the progression of atherosclerotic disease. On appropriate oral medications.   Cellulitis of leg, right A few months ago, had a course of amoxil, improved  Swelling of limb Significantly improved in Unna boots. Still prominent, but better  Lymphedema The patient has developed significant improvement with several months and Unna boots. Were going to continue weekly Unna boot wraps. I will reassess him in 4 weeks. As long as he is making progress, I think it makes sense to continue with Unna boot therapies.   , MD  04/05/2020 11:58 AM    This note was created with Dragon medical transcription system.  Any errors from dictation are purely unintentional

## 2020-04-12 ENCOUNTER — Other Ambulatory Visit: Payer: Self-pay

## 2020-04-12 ENCOUNTER — Ambulatory Visit (INDEPENDENT_AMBULATORY_CARE_PROVIDER_SITE_OTHER): Payer: Medicare Other | Admitting: Nurse Practitioner

## 2020-04-12 VITALS — BP 126/80 | HR 64 | Ht 72.0 in | Wt 287.0 lb

## 2020-04-12 DIAGNOSIS — L97212 Non-pressure chronic ulcer of right calf with fat layer exposed: Secondary | ICD-10-CM | POA: Diagnosis not present

## 2020-04-12 NOTE — Progress Notes (Signed)
History of Present Illness  There is no documented history at this time  Assessments & Plan   There are no diagnoses linked to this encounter.    Additional instructions  Subjective:  Patient presents with venous ulcer of the Bilateral lower extremity.    Procedure:  3 layer unna wrap was placed Bilateral lower extremity.   Plan:   Follow up in one week.  

## 2020-04-16 ENCOUNTER — Encounter (INDEPENDENT_AMBULATORY_CARE_PROVIDER_SITE_OTHER): Payer: Self-pay | Admitting: Nurse Practitioner

## 2020-04-19 ENCOUNTER — Ambulatory Visit (INDEPENDENT_AMBULATORY_CARE_PROVIDER_SITE_OTHER): Payer: Medicare Other | Admitting: Nurse Practitioner

## 2020-04-19 ENCOUNTER — Other Ambulatory Visit: Payer: Self-pay

## 2020-04-19 ENCOUNTER — Encounter (INDEPENDENT_AMBULATORY_CARE_PROVIDER_SITE_OTHER): Payer: Self-pay | Admitting: Nurse Practitioner

## 2020-04-19 VITALS — BP 128/81 | HR 63 | Ht 72.0 in | Wt 291.0 lb

## 2020-04-19 DIAGNOSIS — L97212 Non-pressure chronic ulcer of right calf with fat layer exposed: Secondary | ICD-10-CM

## 2020-04-19 NOTE — Progress Notes (Signed)
History of Present Illness  There is no documented history at this time  Assessments & Plan   There are no diagnoses linked to this encounter.    Additional instructions  Subjective:  Patient presents with venous ulcer of the Bilateral lower extremity.    Procedure:  3 layer unna wrap was placed Bilateral lower extremity.   Plan:   Follow up in one week.  

## 2020-04-26 ENCOUNTER — Encounter (INDEPENDENT_AMBULATORY_CARE_PROVIDER_SITE_OTHER): Payer: Self-pay | Admitting: Vascular Surgery

## 2020-04-26 ENCOUNTER — Other Ambulatory Visit: Payer: Self-pay

## 2020-04-26 ENCOUNTER — Ambulatory Visit (INDEPENDENT_AMBULATORY_CARE_PROVIDER_SITE_OTHER): Payer: Medicare Other | Admitting: Vascular Surgery

## 2020-04-26 VITALS — BP 146/88 | HR 65 | Resp 16 | Wt 291.0 lb

## 2020-04-26 DIAGNOSIS — I890 Lymphedema, not elsewhere classified: Secondary | ICD-10-CM | POA: Diagnosis not present

## 2020-04-26 DIAGNOSIS — I10 Essential (primary) hypertension: Secondary | ICD-10-CM | POA: Diagnosis not present

## 2020-04-26 DIAGNOSIS — M7989 Other specified soft tissue disorders: Secondary | ICD-10-CM

## 2020-04-26 NOTE — Progress Notes (Signed)
[name] : [id number]  Warren Leblanc is a 75 y.o. (Jul 06, 1945) male who presents with chief complaint of  Chief Complaint  Patient presents with  . Follow-up    4wk unna wrap follow up  .  History of Present Illness: Patient returns today in follow up of his lymphedema and leg swelling.  It is a little worse today than it was at his last visit a few weeks ago.  He is up several pounds.  His ankles and feet are little more puffy.  He is very disappointed by this.  No weeping or signs of infection.  Current Outpatient Medications  Medication Sig Dispense Refill  . ACIDOPHILUS LACTOBACILLUS PO Take by mouth.    Marland Kitchen amoxicillin (AMOXIL) 500 MG capsule amoxicillin 500 mg capsule    . Ascorbic Acid (VITAMIN C) 1000 MG tablet Take by mouth.    Marland Kitchen aspirin EC 81 MG tablet Take 81 mg by mouth daily.    . Biotin 2.5 MG TABS Take by mouth.    . Calcium Ascorbate 500 MG TABS Take by mouth.    . Cyanocobalamin (VITAMIN B 12 PO) Take by mouth.    . Ergocalciferol 2000 units TABS Take by mouth.    . folic acid (FOLVITE) 1 MG tablet Take by mouth.    . furosemide (LASIX) 40 MG tablet Take by mouth as needed.     . Ginger Oil OIL by Does not apply route daily.    Marland Kitchen lisinopril-hydrochlorothiazide (ZESTORETIC) 20-12.5 MG tablet Take 2 tablets by mouth daily.     . Magnesium 100 MG CAPS Take by mouth.    Marland Kitchen MAGNESIUM OXIDE PO Take by mouth.    Darlin Priestly POWD Take one capsule by mouth twice daily.    . Potassium 99 MG TABS Take by mouth.    . Taurine 1000 MG CAPS Take by mouth.    . TURMERIC PO Take by mouth.    Marland Kitchen UNABLE TO FIND Carnivora     No current facility-administered medications for this visit.    Past Medical History:  Diagnosis Date  . Hypertension     No past surgical history on file.   Social History       Tobacco Use  . Smoking status: Current Some Day Smoker  . Smokeless tobacco: Never Used  Substance Use Topics  . Alcohol use: No  . Drug use: No      Family  History No bleeding or clotting disorders   Allergies  Allergen Reactions  . Amoxicillin   . Amoxicillin-Pot Clavulanate   . Antihistamines, Chlorpheniramine-Type Nausea Only  . Diphenhydramine Hcl     Other reaction(s): Other (See Comments) drowsy     REVIEW OF SYSTEMS (Negative unless checked)  Constitutional: [] ?Weight loss  [] ?Fever  [] ?Chills Cardiac: [] ?Chest pain   [] ?Chest pressure   [] ?Palpitations   [] ?Shortness of breath when laying flat   [] ?Shortness of breath at rest   [] ?Shortness of breath with exertion. Vascular:  [] ?Pain in legs with walking   [] ?Pain in legs at rest   [] ?Pain in legs when laying flat   [] ?Claudication   [] ?Pain in feet when walking  [] ?Pain in feet at rest  [] ?Pain in feet when laying flat   [] ?History of DVT   [] ?Phlebitis   [x] ?Swelling in legs   [] ?Varicose veins   [] ?Non-healing ulcers Pulmonary:   [] ?Uses home oxygen   [] ?Productive cough   [] ?Hemoptysis   [] ?Wheeze  [] ?COPD   [] ?Asthma Neurologic:  [] ?  Dizziness  [] ?Blackouts   [] ?Seizures   [] ?History of stroke   [] ?History of TIA  [] ?Aphasia   [] ?Temporary blindness   [] ?Dysphagia   [] ?Weakness or numbness in arms   [] ?Weakness or numbness in legs Musculoskeletal:  [x] ?Arthritis   [] ?Joint swelling   [x] ?Joint pain   [] ?Low back pain Hematologic:  [] ?Easy bruising  [] ?Easy bleeding   [] ?Hypercoagulable state   [] ?Anemic   Gastrointestinal:  [] ?Blood in stool   [] ?Vomiting blood  [] ?Gastroesophageal reflux/heartburn   [] ?Abdominal pain Genitourinary:  [] ?Chronic kidney disease   [] ?Difficult urination  [] ?Frequent urination  [] ?Burning with urination   [] ?Hematuria Skin:  [x] ?Rashes   [] ?Ulcers   [] ?Wounds Psychological:  [] ?History of anxiety   [] ? History of major depression.  Physical Examination  BP (!) 146/88 (BP Location: Left Arm)   Pulse 65   Resp 16   Wt 291 lb (132 kg)   BMI 39.47 kg/m  Gen:  WD/WN, NAD Head: [place]/AT, No temporalis wasting. Ear/Nose/Throat: Hearing grossly  intact, nares w/o erythema or drainage Eyes: Conjunctiva clear. Sclera non-icteric Neck: Supple.  Trachea midline Pulmonary:  Good air movement, no use of accessory muscles.  Cardiac: RRR, no JVD Vascular:  Vessel Right Left  Radial Palpable Palpable       Musculoskeletal: M/S 5/5 throughout.  No deformity or atrophy. 2+ RLE edema, 2-3+ LLE edema. Neurologic: Sensation grossly intact in extremities.  Symmetrical.  Speech is fluent.  Psychiatric: Judgment intact, Mood & affect appropriate for pt's clinical situation. Dermatologic: No rashes or ulcers noted.  No cellulitis or open wounds.       Labs No results found for this or any previous visit (from the past 2160 hour(s)).  Radiology No results found.  Assessment/Plan  Essential hypertension, benign blood pressure control important in reducing the progression of atherosclerotic disease. On appropriate oral medications.   Cellulitis of leg, right A few months ago, had acourse of amoxil, improved  Swelling of limb Significantly improved in Unna boots. A little worse today than last visit.  Lymphedema The patient has developed significant improvement with several months and Unna boots. We're going to continue weekly Unna boot wraps. A little worse today. Recheck in 4 weeks.  , MD  04/26/2020 12:35 PM    This note was created with Dragon medical transcription system.  Any errors from dictation are purely unintentional

## 2020-05-03 ENCOUNTER — Ambulatory Visit (INDEPENDENT_AMBULATORY_CARE_PROVIDER_SITE_OTHER): Payer: Medicare Other | Admitting: Nurse Practitioner

## 2020-05-03 ENCOUNTER — Encounter (INDEPENDENT_AMBULATORY_CARE_PROVIDER_SITE_OTHER): Payer: Self-pay

## 2020-05-03 ENCOUNTER — Other Ambulatory Visit: Payer: Self-pay

## 2020-05-03 VITALS — BP 130/82 | HR 60 | Resp 16 | Wt 291.0 lb

## 2020-05-03 DIAGNOSIS — L97212 Non-pressure chronic ulcer of right calf with fat layer exposed: Secondary | ICD-10-CM

## 2020-05-03 NOTE — Progress Notes (Signed)
History of Present Illness  There is no documented history at this time  Assessments & Plan   There are no diagnoses linked to this encounter.    Additional instructions  Subjective:  Patient presents with venous ulcer of the Bilateral lower extremity.    Procedure:  3 layer unna wrap was placed Bilateral lower extremity.   Plan:   Follow up in one week.  

## 2020-05-07 ENCOUNTER — Encounter (INDEPENDENT_AMBULATORY_CARE_PROVIDER_SITE_OTHER): Payer: Self-pay | Admitting: Nurse Practitioner

## 2020-05-10 ENCOUNTER — Ambulatory Visit (INDEPENDENT_AMBULATORY_CARE_PROVIDER_SITE_OTHER): Payer: Medicare Other | Admitting: Nurse Practitioner

## 2020-05-10 ENCOUNTER — Other Ambulatory Visit: Payer: Self-pay

## 2020-05-10 ENCOUNTER — Encounter (INDEPENDENT_AMBULATORY_CARE_PROVIDER_SITE_OTHER): Payer: Self-pay

## 2020-05-10 VITALS — BP 134/85 | HR 67 | Resp 16 | Wt 290.0 lb

## 2020-05-10 DIAGNOSIS — L97212 Non-pressure chronic ulcer of right calf with fat layer exposed: Secondary | ICD-10-CM

## 2020-05-10 NOTE — Progress Notes (Signed)
History of Present Illness  There is no documented history at this time  Assessments & Plan   There are no diagnoses linked to this encounter.    Additional instructions  Subjective:  Patient presents with venous ulcer of the Bilateral lower extremity.    Procedure:  3 layer unna wrap was placed Bilateral lower extremity.   Plan:   Follow up in one week.  

## 2020-05-16 ENCOUNTER — Encounter (INDEPENDENT_AMBULATORY_CARE_PROVIDER_SITE_OTHER): Payer: Self-pay | Admitting: Nurse Practitioner

## 2020-05-17 ENCOUNTER — Other Ambulatory Visit: Payer: Self-pay

## 2020-05-17 ENCOUNTER — Encounter (INDEPENDENT_AMBULATORY_CARE_PROVIDER_SITE_OTHER): Payer: Self-pay | Admitting: Vascular Surgery

## 2020-05-17 ENCOUNTER — Ambulatory Visit (INDEPENDENT_AMBULATORY_CARE_PROVIDER_SITE_OTHER): Payer: Medicare Other | Admitting: Vascular Surgery

## 2020-05-17 VITALS — BP 148/89 | HR 76 | Resp 16 | Wt 289.0 lb

## 2020-05-17 DIAGNOSIS — L03115 Cellulitis of right lower limb: Secondary | ICD-10-CM | POA: Diagnosis not present

## 2020-05-17 DIAGNOSIS — I890 Lymphedema, not elsewhere classified: Secondary | ICD-10-CM

## 2020-05-17 DIAGNOSIS — I10 Essential (primary) hypertension: Secondary | ICD-10-CM | POA: Diagnosis not present

## 2020-05-17 DIAGNOSIS — M7989 Other specified soft tissue disorders: Secondary | ICD-10-CM | POA: Diagnosis not present

## 2020-05-17 NOTE — Assessment & Plan Note (Signed)
Stable, continue UNNA wraps

## 2020-05-17 NOTE — Progress Notes (Signed)
[name] : [id number]  Warren Leblanc is a 75 y.o. (11/08/1945) male who presents with chief complaint of  Chief Complaint  Patient presents with  . Follow-up    Unna boot check  .  History of Present Illness: Patient returns today in follow up of his leg swelling and lymphedema.  His legs are about the same as they were 3 weeks ago.  No major changes or issues.  He has continued weekly Unna boots.  Current Outpatient Medications  Medication Sig Dispense Refill  . ACIDOPHILUS LACTOBACILLUS PO Take by mouth.    Marland Kitchen amoxicillin (AMOXIL) 500 MG capsule amoxicillin 500 mg capsule    . Ascorbic Acid (VITAMIN C) 1000 MG tablet Take by mouth.    Marland Kitchen aspirin EC 81 MG tablet Take 81 mg by mouth daily.    . Biotin 2.5 MG TABS Take by mouth.    . Calcium Ascorbate 500 MG TABS Take by mouth.    . Cyanocobalamin (VITAMIN B 12 PO) Take by mouth.    . Ergocalciferol 2000 units TABS Take by mouth.    . folic acid (FOLVITE) 1 MG tablet Take by mouth.    . furosemide (LASIX) 40 MG tablet Take by mouth as needed.     . Ginger Oil OIL by Does not apply route daily.    Marland Kitchen lisinopril-hydrochlorothiazide (ZESTORETIC) 20-12.5 MG tablet Take 2 tablets by mouth daily.     . Magnesium 100 MG CAPS Take by mouth.    Marland Kitchen MAGNESIUM OXIDE PO Take by mouth.    Darlin Priestly POWD Take one capsule by mouth twice daily.    . Potassium 99 MG TABS Take by mouth.    . Taurine 1000 MG CAPS Take by mouth.    . TURMERIC PO Take by mouth.    Marland Kitchen UNABLE TO FIND Carnivora     No current facility-administered medications for this visit.    Past Medical History:  Diagnosis Date  . Hypertension     No past surgical history on file.   Social History   Tobacco Use  . Smoking status: Current Some Day Smoker  . Smokeless tobacco: Never Used  Substance Use Topics  . Alcohol use: No  . Drug use: No       No family history on file.   Allergies  Allergen Reactions  . Amoxicillin   . Amoxicillin-Pot Clavulanate   .  Antihistamines, Chlorpheniramine-Type Nausea Only  . Diphenhydramine Hcl     Other reaction(s): Other (See Comments) drowsy     REVIEW OF SYSTEMS(Negative unless checked)  Constitutional: [] ??Weight loss [] ??Fever [] ??Chills Cardiac: [] ??Chest pain [] ??Chest pressure [] ??Palpitations [] ??Shortness of breath when laying flat [] ??Shortness of breath at rest [] ??Shortness of breath with exertion. Vascular: [] ??Pain in legs with walking [] ??Pain in legs at rest [] ??Pain in legs when laying flat [] ??Claudication [] ??Pain in feet when walking [] ??Pain in feet at rest [] ??Pain in feet when laying flat [] ??History of DVT [] ??Phlebitis [x] ??Swelling in legs [] ??Varicose veins [] ??Non-healing ulcers Pulmonary: [] ??Uses home oxygen [] ??Productive cough [] ??Hemoptysis [] ??Wheeze [] ??COPD [] ??Asthma Neurologic: [] ??Dizziness [] ??Blackouts [] ??Seizures [] ??History of stroke [] ??History of TIA [] ??Aphasia [] ??Temporary blindness [] ??Dysphagia [] ??Weakness or numbness in arms [] ??Weakness or numbness in legs Musculoskeletal: [x] ??Arthritis [] ??Joint swelling [x] ??Joint pain [] ??Low back pain Hematologic: [] ??Easy bruising [] ??Easy bleeding [] ??Hypercoagulable state [] ??Anemic  Gastrointestinal: [] ??Blood in stool [] ??Vomiting blood [] ??Gastroesophageal reflux/heartburn [] ??Abdominal pain Genitourinary: [] ??Chronic kidney disease [] ??Difficult urination [] ??Frequent urination [] ??Burning with urination [] ??Hematuria Skin: [x] ??Rashes [] ??Ulcers [] ??Wounds Psychological: [] ??History of anxiety [] ??History of major  depression.  Physical Examination  BP (!) 148/89 (BP Location: Left Arm)   Pulse 76   Resp 16   Wt 289 lb (131.1 kg)   BMI 39.20 kg/m  Gen:  WD/WN, NAD Head: [place]/AT, No temporalis wasting. Ear/Nose/Throat: Hearing grossly intact, nares w/o erythema or drainage Eyes: Conjunctiva clear. Sclera  non-icteric Neck: Supple.  Trachea midline Pulmonary:  Good air movement, no use of accessory muscles.  Cardiac: RRR, no JVD Vascular:  Vessel Right Left  Radial Palpable Palpable       Musculoskeletal: M/S 5/5 throughout.  No deformity or atrophy. 2+ BLE edema. Neurologic: Sensation grossly intact in extremities.  Symmetrical.  Speech is fluent.  Psychiatric: Judgment intact, Mood & affect appropriate for pt's clinical situation. Dermatologic: No rashes or ulcers noted.  No cellulitis or open wounds.       Labs No results found for this or any previous visit (from the past 2160 hour(s)).  Radiology No results found.  Assessment/Plan Essential hypertension, benign blood pressure control important in reducing the progression of atherosclerotic disease. On appropriate oral medications.   Cellulitis of leg, right A few months ago, had acourse of amoxil, improved  Lymphedema Stable, continue UNNA wraps.  Swelling of limb Stable, continue UNNA wraps    Festus Barren, MD  05/17/2020 11:15 AM    This note was created with Dragon medical transcription system.  Any errors from dictation are purely unintentional

## 2020-05-17 NOTE — Assessment & Plan Note (Signed)
Stable, continue UNNA wraps 

## 2020-05-24 ENCOUNTER — Other Ambulatory Visit: Payer: Self-pay

## 2020-05-24 ENCOUNTER — Ambulatory Visit (INDEPENDENT_AMBULATORY_CARE_PROVIDER_SITE_OTHER): Payer: Medicare Other | Admitting: Nurse Practitioner

## 2020-05-24 ENCOUNTER — Encounter (INDEPENDENT_AMBULATORY_CARE_PROVIDER_SITE_OTHER): Payer: Self-pay

## 2020-05-24 VITALS — BP 126/81 | HR 66 | Resp 16 | Wt 291.6 lb

## 2020-05-24 DIAGNOSIS — L97212 Non-pressure chronic ulcer of right calf with fat layer exposed: Secondary | ICD-10-CM

## 2020-05-24 NOTE — Progress Notes (Signed)
History of Present Illness  There is no documented history at this time  Assessments & Plan   There are no diagnoses linked to this encounter.    Additional instructions  Subjective:  Patient presents with venous ulcer of the Bilateral lower extremity.    Procedure:  3 layer unna wrap was placed Bilateral lower extremity.   Plan:   Follow up in one week.  

## 2020-05-25 ENCOUNTER — Encounter (INDEPENDENT_AMBULATORY_CARE_PROVIDER_SITE_OTHER): Payer: Self-pay | Admitting: Nurse Practitioner

## 2020-05-31 ENCOUNTER — Ambulatory Visit (INDEPENDENT_AMBULATORY_CARE_PROVIDER_SITE_OTHER): Payer: Medicare Other | Admitting: Nurse Practitioner

## 2020-05-31 ENCOUNTER — Other Ambulatory Visit: Payer: Self-pay

## 2020-05-31 ENCOUNTER — Encounter (INDEPENDENT_AMBULATORY_CARE_PROVIDER_SITE_OTHER): Payer: Self-pay | Admitting: Nurse Practitioner

## 2020-05-31 VITALS — BP 129/82 | HR 65 | Ht 72.0 in | Wt 275.0 lb

## 2020-05-31 DIAGNOSIS — L97212 Non-pressure chronic ulcer of right calf with fat layer exposed: Secondary | ICD-10-CM | POA: Diagnosis not present

## 2020-05-31 NOTE — Progress Notes (Signed)
History of Present Illness  There is no documented history at this time  Assessments & Plan   There are no diagnoses linked to this encounter.    Additional instructions  Subjective:  Patient presents with venous ulcer of the Bilateral lower extremity.    Procedure:  3 layer unna wrap was placed Bilateral lower extremity.   Plan:   Follow up in one week.  

## 2020-06-01 ENCOUNTER — Emergency Department: Payer: Medicare Other

## 2020-06-01 ENCOUNTER — Emergency Department
Admission: EM | Admit: 2020-06-01 | Discharge: 2020-06-01 | Disposition: A | Discharge status: Home / Self Care | Payer: Medicare Other | Attending: Student in an Organized Health Care Education/Training Program | Admitting: Student in an Organized Health Care Education/Training Program

## 2020-06-01 ENCOUNTER — Other Ambulatory Visit: Payer: Self-pay

## 2020-06-01 DIAGNOSIS — Z7982 Long term (current) use of aspirin: Secondary | ICD-10-CM | POA: Insufficient documentation

## 2020-06-01 DIAGNOSIS — F17200 Nicotine dependence, unspecified, uncomplicated: Secondary | ICD-10-CM | POA: Diagnosis not present

## 2020-06-01 DIAGNOSIS — I10 Essential (primary) hypertension: Secondary | ICD-10-CM | POA: Diagnosis not present

## 2020-06-01 DIAGNOSIS — Z79899 Other long term (current) drug therapy: Secondary | ICD-10-CM | POA: Diagnosis not present

## 2020-06-01 DIAGNOSIS — S42212A Unspecified displaced fracture of surgical neck of left humerus, initial encounter for closed fracture: Secondary | ICD-10-CM | POA: Diagnosis not present

## 2020-06-01 DIAGNOSIS — S4992XA Unspecified injury of left shoulder and upper arm, initial encounter: Secondary | ICD-10-CM | POA: Diagnosis present

## 2020-06-01 DIAGNOSIS — S01112A Laceration without foreign body of left eyelid and periocular area, initial encounter: Secondary | ICD-10-CM | POA: Diagnosis not present

## 2020-06-01 DIAGNOSIS — W010XXA Fall on same level from slipping, tripping and stumbling without subsequent striking against object, initial encounter: Secondary | ICD-10-CM | POA: Diagnosis not present

## 2020-06-01 DIAGNOSIS — W19XXXA Unspecified fall, initial encounter: Secondary | ICD-10-CM

## 2020-06-01 DIAGNOSIS — S0181XA Laceration without foreign body of other part of head, initial encounter: Secondary | ICD-10-CM

## 2020-06-01 IMAGING — CR DG SHOULDER 2+V*L*
1 series · 3 of 3 positions shown · non-contrast
Comparison: None.

CLINICAL DATA: 72-year-old male with fall and trauma to the left
shoulder.

EXAM:
LEFT SHOULDER - 2+ VIEW

[Series 1: w shoulder external left · 0.14mm/px · 3 of 3 slices shown]
[im 1/3]
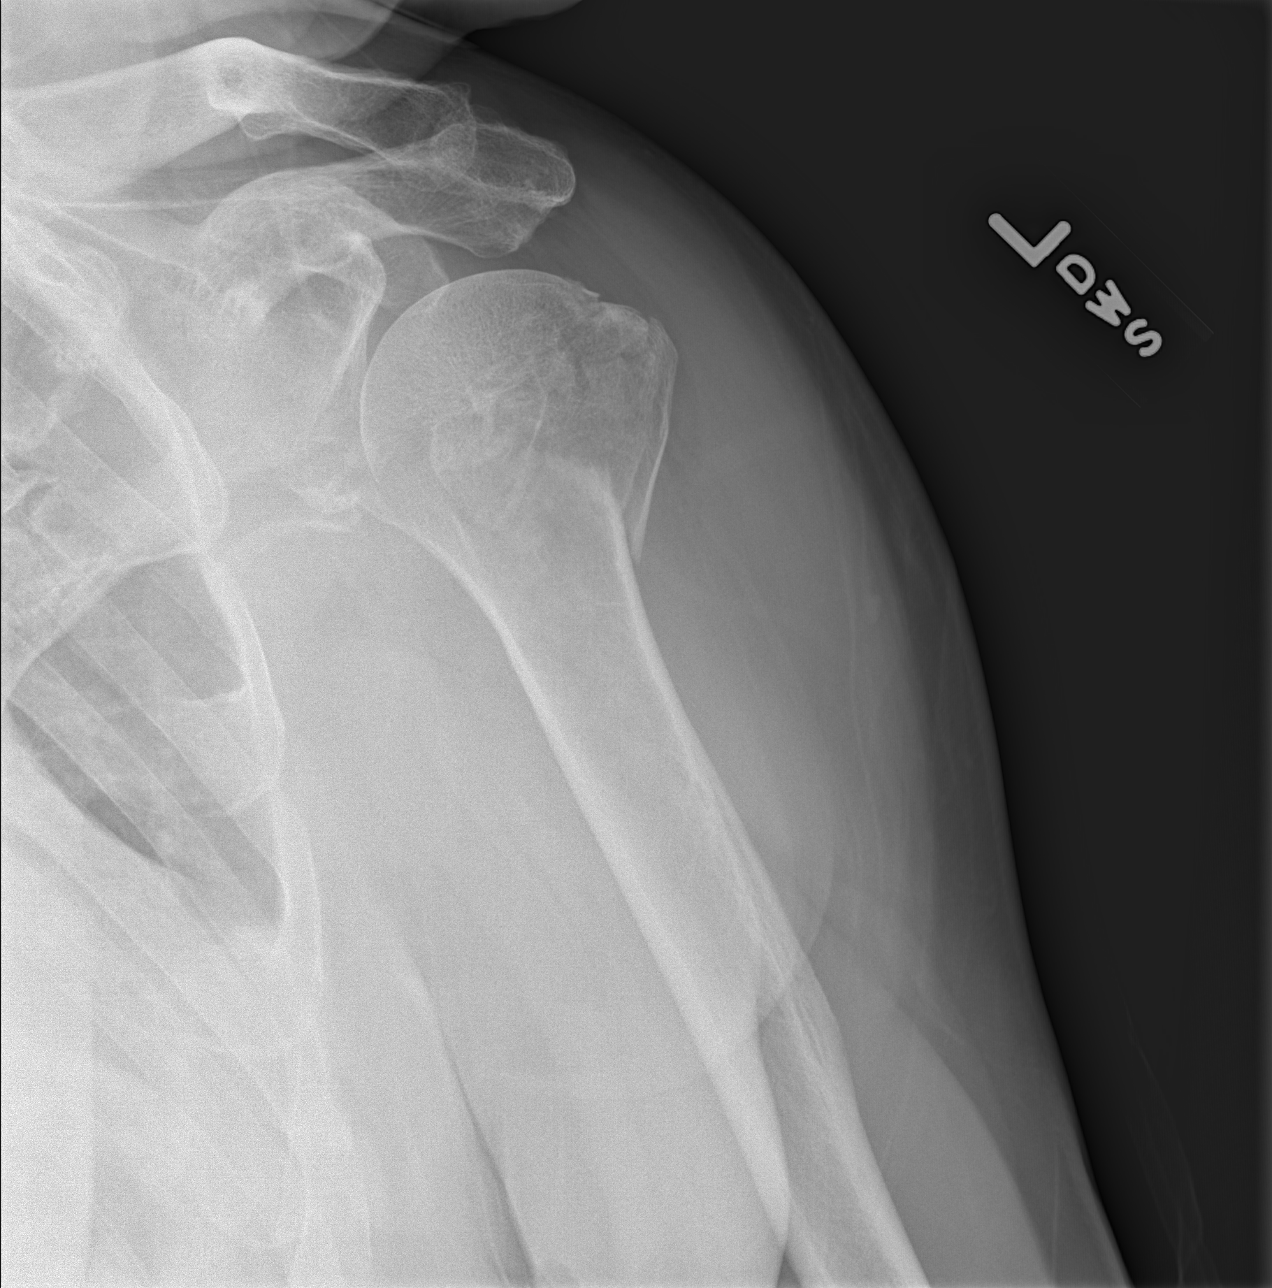
[im 2/3]
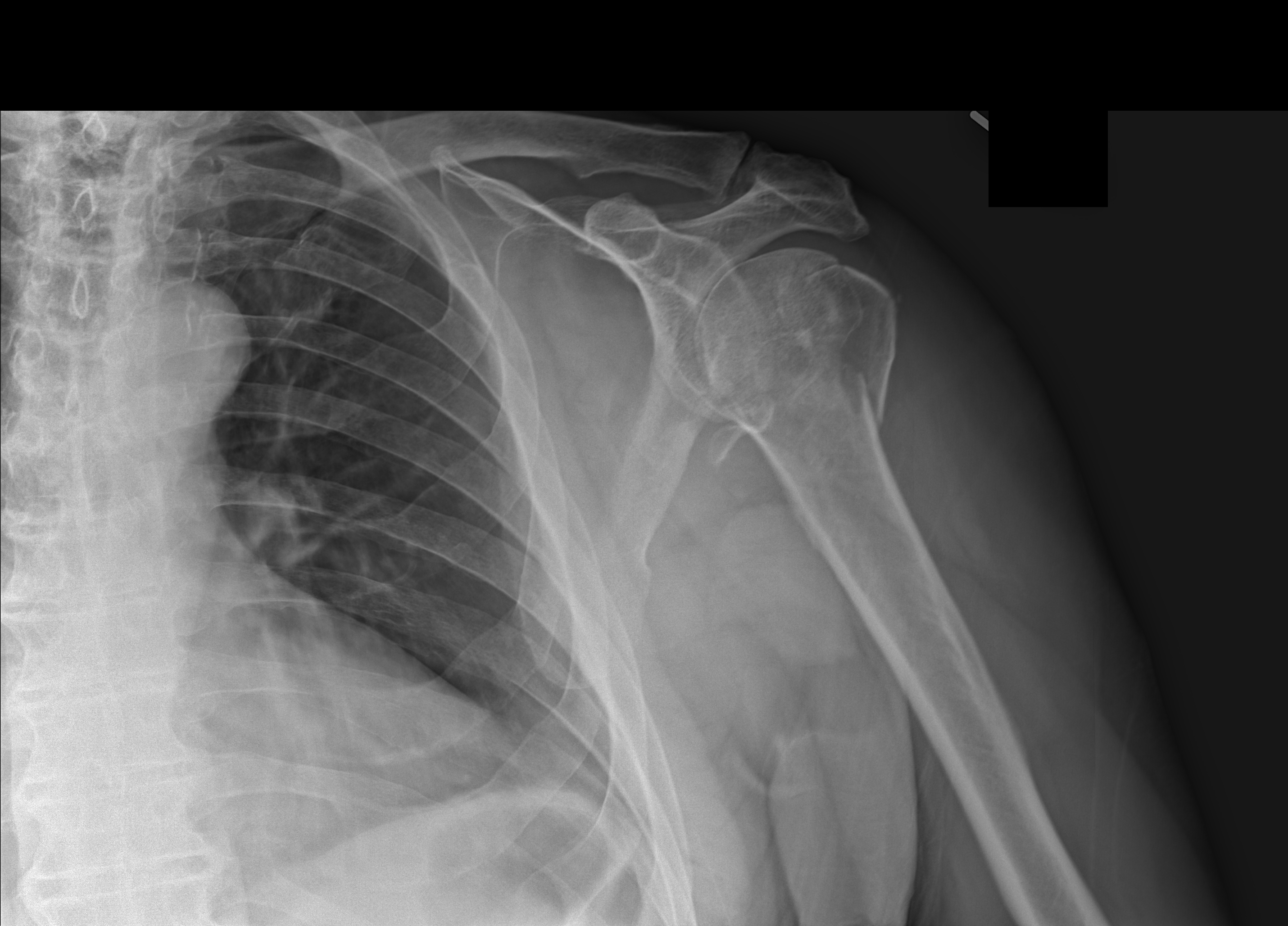
[im 3/3]
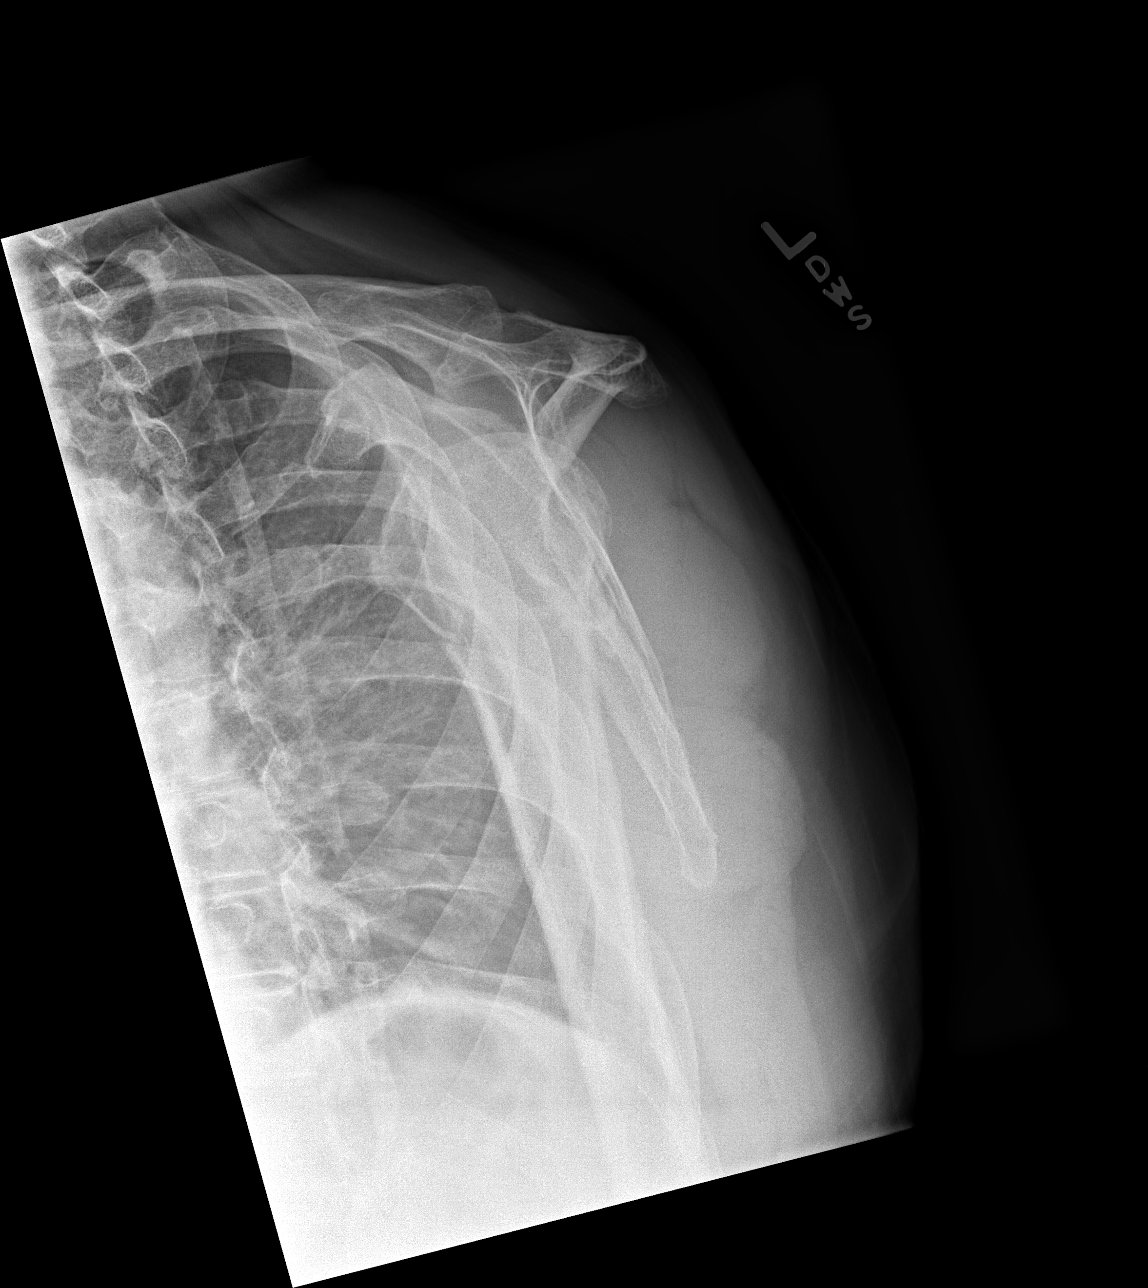

[3 of 3 positions shown; findings below may reference images not displayed]

FINDINGS: There is a comminuted and mildly displaced fracture of the lateral
left humeral head and neck with involvement of the greater
tuberosity. The bones are osteopenic. There is no dislocation. The
soft tissues are unremarkable.
IMPRESSION: Mildly displaced comminuted fracture of the lateral left humeral
head and neck.

## 2020-06-01 IMAGING — DX DG FOREARM 2V*L*
2 series · 2 of 2 positions shown · non-contrast
Comparison: None.

CLINICAL DATA: Left forearm pain after fall.

EXAM:
LEFT FOREARM - 2 VIEW

[forearm ap]
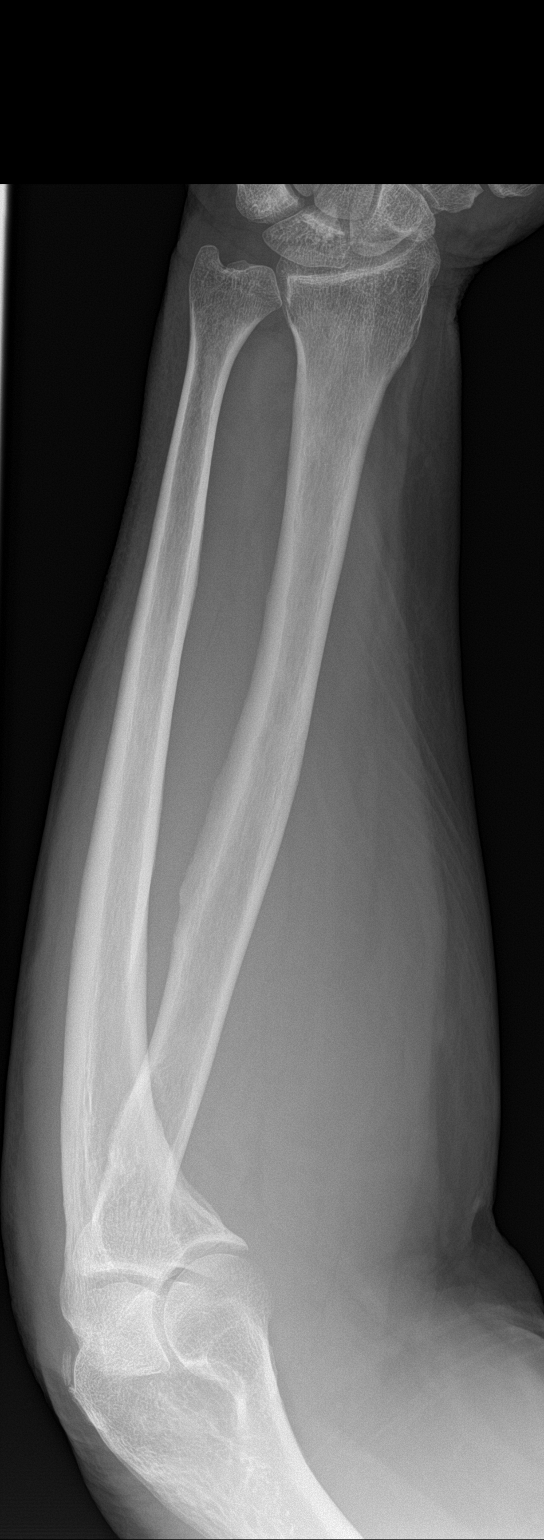

[forearm lat]
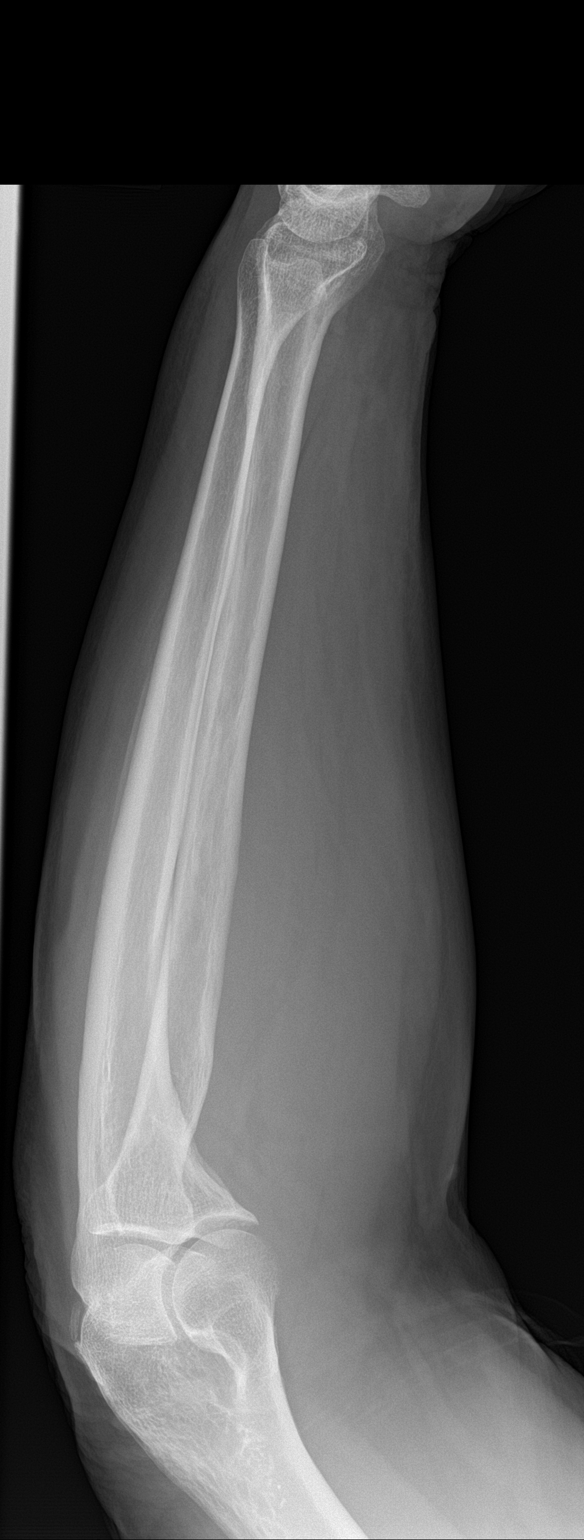

[2 of 2 positions shown; findings below may reference images not displayed]

FINDINGS: There is no evidence of fracture or other focal bone lesions. Soft
tissues are unremarkable.
IMPRESSION: Negative.

## 2020-06-01 MED ORDER — TRAMADOL HCL 50 MG PO TABS: 50.0000 mg | ORAL_TABLET | Freq: Four times a day (QID) | ORAL | 0 refills | Status: AC | PRN

## 2020-06-01 NOTE — ED Provider Notes (Signed)
[facility] Emergency Department Provider Note    Event Date/Time   First MD Initiated Contact with Patient 06/01/20 1556     (approximate)  I have reviewed the triage vital signs and the nursing notes.   HISTORY  Chief Complaint Fall and Shoulder Pain    HPI Warren Leblanc is a 75 y.o. male below listed past medical history presents to the ER for evaluation of left shoulder pain that occurred after mechanical fall.  States he was carrying a grocery bag and lost his footing slipping.  Did graze his left forehead against the car as he was catching his fall with his left arm.  No LOC.  States he did not strike his head.  Numbness or tingling.  No neck pain.  Describes left shoulder pain is mild to moderate.  Tetanus is UTD.   Past Medical History:  Diagnosis Date  . Hypertension    History reviewed. No pertinent family history. History reviewed. No pertinent surgical history. Patient Active Problem List   Diagnosis Date Noted  . Lower limb ulcer, calf, right, with fat layer exposed (HCC) 11/17/2019  . Cellulitis of leg, right 05/05/2019  . Abdominal swelling 10/28/2018  . Rash and nonspecific skin eruption 04/22/2018  . Pain in limb 10/15/2017  . Cellulitis of leg, left 05/01/2016  . Actinic keratosis 04/03/2016  . Candidal intertrigo 04/03/2016  . Weight gain 04/03/2016  . Essential hypertension, benign 02/17/2016  . Swelling of limb 02/17/2016  . Lymphedema 02/17/2016      Prior to Admission medications   Medication Sig Start Date End Date Taking? Authorizing Provider  traMADol (ULTRAM) 50 MG tablet Take 1 tablet (50 mg total) by mouth every 6 (six) hours as needed. 06/01/20 06/01/21 Yes Willy Eddy, MD  ACIDOPHILUS LACTOBACILLUS PO Take by mouth.    [provider]  amoxicillin (AMOXIL) 500 MG capsule amoxicillin 500 mg capsule    [provider]  Ascorbic Acid (VITAMIN C) 1000 MG tablet Take by mouth.    [provider]  aspirin EC 81 MG tablet Take 81 mg by mouth daily.    [provider]  Biotin 2.5 MG TABS Take by mouth.    [provider]  Calcium Ascorbate 500 MG TABS Take by mouth.    [provider]  Cyanocobalamin (VITAMIN B 12 PO) Take by mouth.    [provider]  Ergocalciferol 2000 units TABS Take by mouth.    [provider]  folic acid (FOLVITE) 1 MG tablet Take by mouth.    [provider]  furosemide (LASIX) 40 MG tablet Take by mouth as needed.     [provider]  Ginger Oil OIL by Does not apply route daily.    [provider]  lisinopril-hydrochlorothiazide (ZESTORETIC) 20-12.5 MG tablet Take 2 tablets by mouth daily.     [provider]  Magnesium 100 MG CAPS Take by mouth.    [provider]  MAGNESIUM OXIDE PO Take by mouth.    [provider]  Nadide POWD Take one capsule by mouth twice daily. 02/03/19   [provider]  Potassium 99 MG TABS Take by mouth.    [provider]  Taurine 1000 MG CAPS Take by mouth.    [provider]  TURMERIC PO Take by mouth.    [provider]  UNABLE TO FIND Carnivora    [provider]    Allergies Amoxicillin; Amoxicillin-pot clavulanate; Antihistamines, chlorpheniramine-type;  and Diphenhydramine hcl    Social History Social History   Tobacco Use  . Smoking status: Current Some Day Smoker  . Smokeless tobacco: Never Used  Substance Use Topics  . Alcohol use: No  . Drug use: No    Review of Systems Patient denies headaches, rhinorrhea, blurry vision, numbness, shortness of breath, chest pain, edema, cough, abdominal pain, nausea, vomiting, diarrhea, dysuria, fevers, rashes or hallucinations unless otherwise stated above in HPI. ____________________________________________   PHYSICAL EXAM:  VITAL SIGNS: Vitals:   06/01/20 1447  BP: (!) 135/91  Pulse: 73  Resp: 18  Temp:  97.6 F (36.4 C)  SpO2: 94%    Constitutional: Alert and oriented.  Eyes: Conjunctivae are normal.  Head: Atraumatic. Nose: No congestion/rhinnorhea. Mouth/Throat: Mucous membranes are moist.   Neck: No stridor. Painless ROM.  Cardiovascular: Normal rate, regular rhythm. Grossly normal heart sounds.  Good peripheral circulation. Respiratory: Normal respiratory effort.  No retractions. Lungs CTAB. Gastrointestinal: Soft and nontender. No distention. No abdominal bruits. No CVA tenderness. Genitourinary:  Musculoskeletal: Pain to palpation of the left shoulder.  No obvious deformity.  Neurovascular intact distally.  Mild pain at the left elbow.  No lower extremity tenderness nor edema.  No joint effusions. Neurologic:  Normal speech and language. No gross focal neurologic deficits are appreciated. No facial droop Skin:  Skin is warm, dry and intact. No rash noted. Psychiatric: Mood and affect are normal. Speech and behavior are normal.  ____________________________________________   LABS (all labs ordered are listed, but only abnormal results are displayed)  No results found for this or any previous visit (from the past 24 hour(s)). ____________________________________________ ____________________________________________  RADIOLOGY  I personally reviewed all radiographic images ordered to evaluate for the above acute complaints and reviewed radiology reports and findings.  These findings were personally discussed with the patient.  Please see medical record for radiology report.  ____________________________________________   PROCEDURES  Procedure(s) performed:  Marland KitchenMarland KitchenLaceration Repair  Date/Time: 06/01/2020 4:43 PM Performed by: Willy Eddy, MD Authorized by: Willy Eddy, MD   Consent:    Consent obtained:  Verbal   Consent given by:  Patient   Risks, benefits, and alternatives were discussed: yes   Anesthesia:    Anesthesia method:  None Laceration details:     Location:  Face   Face location:  L eyebrow   Length (cm):  1   Depth (mm):  2 Exploration:    Contaminated: no   Treatment:    Area cleansed with:  Chlorhexidine Skin repair:    Repair method:  Tissue adhesive Approximation:    Approximation:  Close Repair type:    Repair type:  Simple Post-procedure details:    Procedure completion:  Tolerated well, no immediate complications      Critical Care performed: no ____________________________________________   INITIAL IMPRESSION / ASSESSMENT AND PLAN / ED COURSE  Pertinent labs & imaging results that were available during my care of the patient were reviewed by me and considered in my medical decision making (see chart for details).   DDX: fracture, contusion, dislocation  Warren Leblanc is a 75 y.o. who presents to the ED with presentation of described above.  Patient nontoxic-appearing.  Has a small laceration to left forehead.  Patient adamant that he did not hit his head this is more of an abrasion injury as he was stopping himself.  Patient deferring CT head.  He is on anticoagulation otherwise well-appearing which I think is reasonable at this time.  X-ray with  evidence of proximal humerus fracture neurovascular intact.  Placed in sling.  Patient stable appropriate for outpatient follow-up.     The patient was evaluated in Emergency Department today for the symptoms described in the history of present illness. He/she was evaluated in the context of the global COVID-19 pandemic, which necessitated consideration that the patient might be at risk for infection with the SARS-CoV-2 virus that causes COVID-19. Institutional protocols and algorithms that pertain to the evaluation of patients at risk for COVID-19 are in a state of rapid change based on information released by regulatory bodies including the CDC and federal and state organizations. These policies and algorithms were followed during the patient's care in the ED.  As  part of my medical decision making, I reviewed the following data within the electronic MEDICAL RECORD NUMBER Nursing notes reviewed and incorporated, Labs reviewed, notes from prior ED visits and [place] Controlled Substance Database   ____________________________________________   FINAL CLINICAL IMPRESSION(S) / ED DIAGNOSES  Final diagnoses:  Fall, initial encounter  Laceration of forehead, initial encounter  Closed displaced fracture of surgical neck of left humerus, unspecified fracture morphology, initial encounter      NEW MEDICATIONS STARTED DURING THIS VISIT:  New Prescriptions   TRAMADOL (ULTRAM) 50 MG TABLET    Take 1 tablet (50 mg total) by mouth every 6 (six) hours as needed.     Note:  This document was prepared using Dragon voice recognition software and may include unintentional dictation errors.    Willy Eddy, MD 06/01/20 [phone]

## 2020-06-01 NOTE — ED Triage Notes (Signed)
Pt comes pov after tripping and landing on left shoulder/arm.

## 2020-06-01 NOTE — ED Notes (Signed)
+  cap refill, +radial pulses, LUE in sling. Pt denies numbness or tingling to LUE. Small abrasion with bleeding controlled noted to L lateral orbit region. AO x4. Talking in full sentences with regular and unlabored breathing

## 2020-06-07 ENCOUNTER — Other Ambulatory Visit: Payer: Self-pay

## 2020-06-07 ENCOUNTER — Ambulatory Visit (INDEPENDENT_AMBULATORY_CARE_PROVIDER_SITE_OTHER): Payer: Medicare Other | Admitting: Vascular Surgery

## 2020-06-07 VITALS — BP 123/85 | HR 106 | Ht 72.0 in | Wt 248.0 lb

## 2020-06-07 DIAGNOSIS — L03115 Cellulitis of right lower limb: Secondary | ICD-10-CM | POA: Diagnosis not present

## 2020-06-07 DIAGNOSIS — I10 Essential (primary) hypertension: Secondary | ICD-10-CM

## 2020-06-07 DIAGNOSIS — M7989 Other specified soft tissue disorders: Secondary | ICD-10-CM | POA: Diagnosis not present

## 2020-06-07 DIAGNOSIS — I890 Lymphedema, not elsewhere classified: Secondary | ICD-10-CM | POA: Diagnosis not present

## 2020-06-07 NOTE — Progress Notes (Signed)
[name] : [id number]  Warren Leblanc is a 75 y.o. ([birth year]) male who presents with chief complaint of  Chief Complaint  Patient presents with  . Follow-up    4 wk BIL unna boot check  .  History of Present Illness: Patient returns today in follow up of lymphedema.  Since his last visit, about a week ago he fell tripping down the stairs and broke his left shoulder.  This has resulted in his arm being quite swollen and him being much less mobile which has worsened his lower extremity swelling.  He fell particularly on the right knee and he has a lot of swelling around the right knee and his wrap rolled down.  He also has some abrasions on his hands and face as well as some bruising  Current Outpatient Medications  Medication Sig Dispense Refill  . ACIDOPHILUS LACTOBACILLUS PO Take by mouth.    Marland Kitchen amoxicillin (AMOXIL) 500 MG capsule amoxicillin 500 mg capsule    . Ascorbic Acid (VITAMIN C) 1000 MG tablet Take by mouth.    Marland Kitchen aspirin EC 81 MG tablet Take 81 mg by mouth daily.    . Biotin 2.5 MG TABS Take by mouth.    . Calcium Ascorbate 500 MG TABS Take by mouth.    . Cyanocobalamin (VITAMIN B 12 PO) Take by mouth.    . Ergocalciferol 2000 units TABS Take by mouth.    . folic acid (FOLVITE) 1 MG tablet Take by mouth.    . furosemide (LASIX) 40 MG tablet Take by mouth as needed.     . Ginger Oil OIL by Does not apply route daily.    Marland Kitchen lisinopril-hydrochlorothiazide (ZESTORETIC) 20-12.5 MG tablet Take 2 tablets by mouth daily.     . Magnesium 100 MG CAPS Take by mouth.    Marland Kitchen MAGNESIUM OXIDE PO Take by mouth.    Darlin Priestly POWD Take one capsule by mouth twice daily.    . Potassium 99 MG TABS Take by mouth.    . Taurine 1000 MG CAPS Take by mouth.    . traMADol (ULTRAM) 50 MG tablet Take 1 tablet (50 mg total) by mouth every 6 (six) hours as needed. 10 tablet 0  . TURMERIC PO Take by mouth.    Marland Kitchen UNABLE TO FIND Carnivora     No current facility-administered medications for this visit.     Past Medical History:  Diagnosis Date  . Hypertension     No past surgical history on file.   Social History   Tobacco Use  . Smoking status: Current Some Day Smoker  . Smokeless tobacco: Never Used  Substance Use Topics  . Alcohol use: No  . Drug use: No      No family history on file.   Allergies  Allergen Reactions  . Amoxicillin   . Amoxicillin-Pot Clavulanate   . Antihistamines, Chlorpheniramine-Type Nausea Only  . Diphenhydramine Hcl     Other reaction(s): Other (See Comments) drowsy     REVIEW OF SYSTEMS (Negative unless checked)  Constitutional: [] Weight loss  [] Fever  [] Chills Cardiac: [] Chest pain   [] Chest pressure   [] Palpitations   [] Shortness of breath when laying flat   [] Shortness of breath at rest   [] Shortness of breath with exertion. Vascular:  [x] Pain in legs with walking   [x] Pain in legs at rest   [] Pain in legs when laying flat   [] Claudication   [] Pain in feet when walking  [] Pain in feet at rest  []   Pain in feet when laying flat   [] History of DVT   [] Phlebitis   [x] Swelling in legs   [] Varicose veins   [] Non-healing ulcers Pulmonary:   [] Uses home oxygen   [] Productive cough   [] Hemoptysis   [] Wheeze  [] COPD   [] Asthma Neurologic:  [] Dizziness  [] Blackouts   [] Seizures   [] History of stroke   [] History of TIA  [] Aphasia   [] Temporary blindness   [] Dysphagia   [] Weakness or numbness in arms   [] Weakness or numbness in legs Musculoskeletal:  [x] Arthritis   [] Joint swelling   [x] Joint pain   [] Low back pain Hematologic:  [] Easy bruising  [] Easy bleeding   [] Hypercoagulable state   [] Anemic   Gastrointestinal:  [] Blood in stool   [] Vomiting blood  [] Gastroesophageal reflux/heartburn   [] Abdominal pain Genitourinary:  [] Chronic kidney disease   [] Difficult urination  [] Frequent urination  [] Burning with urination   [] Hematuria Skin:  [x] Rashes   [] Ulcers   [] Wounds Psychological:  [] History of anxiety   []  History of major  depression.  Physical Examination  BP 123/85   Pulse (!) 106   Ht 6' (1.829 m)   Wt 248 lb (112.5 kg)   BMI 33.63 kg/m  Gen:  WD/WN, NAD Head: [place]/AT, No temporalis wasting. Ear/Nose/Throat: Hearing grossly intact, nares w/o erythema or drainage Eyes: Conjunctiva clear. Sclera non-icteric Neck: Supple.  Trachea midline Pulmonary:  Good air movement, no use of accessory muscles.  Cardiac: Somewhat tachycardic Vascular:  Vessel Right Left  Radial Palpable Palpable                   Musculoskeletal: M/S 5/5 throughout.  No deformity or atrophy.  2+ bilateral lower extremity edema. Neurologic: Sensation grossly intact in extremities.  Symmetrical.  Speech is fluent.  Psychiatric: Judgment intact, Mood & affect appropriate for pt's clinical situation. Dermatologic: No rashes or ulcers noted.  No cellulitis or open wounds.       Labs No results found for this or any previous visit (from the past 2160 hour(s)).  Radiology DG Forearm Left  Result Date: 06/01/2020 CLINICAL DATA:  Left forearm pain after fall. EXAM: LEFT FOREARM - 2 VIEW COMPARISON:  None. FINDINGS: There is no evidence of fracture or other focal bone lesions. Soft tissues are unremarkable. IMPRESSION: Negative. Electronically Signed   By: M.D.   On: 06/01/2020 16:35   DG Shoulder Left  Result Date: 06/01/2020 CLINICAL DATA:  75 year old male with fall and trauma to the left shoulder. EXAM: LEFT SHOULDER - 2+ VIEW COMPARISON:  None. FINDINGS: There is a comminuted and mildly displaced fracture of the lateral left humeral head and neck with involvement of the greater tuberosity. The bones are osteopenic. There is no dislocation. The soft tissues are unremarkable. IMPRESSION: Mildly displaced comminuted fracture of the lateral left humeral head and neck. Electronically Signed   By: M.D.   On: 06/01/2020 15:29    Assessment/Plan Essential hypertension, benign blood pressure control  important in reducing the progression of atherosclerotic disease. On appropriate oral medications.   Cellulitis of leg, right A few months ago, had acourse of amoxil, improved  Lymphedema Slightly worse from his recent fall and his immobility.  Will need to continue to do Unna wraps. A three layer UNNA wrap was placed bilaterally today. Recheck in 4 weeks    , MD  06/07/2020 11:09 AM    This note was created with Dragon medical transcription system.  Any errors from dictation  are purely unintentional

## 2020-06-14 ENCOUNTER — Other Ambulatory Visit: Payer: Self-pay

## 2020-06-14 ENCOUNTER — Ambulatory Visit (INDEPENDENT_AMBULATORY_CARE_PROVIDER_SITE_OTHER): Payer: Medicare Other | Admitting: Nurse Practitioner

## 2020-06-14 VITALS — BP 116/79 | HR 97 | Ht 69.0 in | Wt 291.0 lb

## 2020-06-14 DIAGNOSIS — L97212 Non-pressure chronic ulcer of right calf with fat layer exposed: Secondary | ICD-10-CM | POA: Diagnosis not present

## 2020-06-14 NOTE — Progress Notes (Signed)
History of Present Illness  There is no documented history at this time  Assessments & Plan   There are no diagnoses linked to this encounter.    Additional instructions  Subjective:  Patient presents with venous ulcer of the Bilateral lower extremity.    Procedure:  3 layer unna wrap was placed Bilateral lower extremity.   Plan:   Follow up in one week.  

## 2020-06-19 ENCOUNTER — Encounter (INDEPENDENT_AMBULATORY_CARE_PROVIDER_SITE_OTHER): Payer: Self-pay | Admitting: Nurse Practitioner

## 2020-06-21 ENCOUNTER — Ambulatory Visit (INDEPENDENT_AMBULATORY_CARE_PROVIDER_SITE_OTHER): Payer: Medicare Other | Admitting: Nurse Practitioner

## 2020-06-21 ENCOUNTER — Other Ambulatory Visit: Payer: Self-pay

## 2020-06-21 VITALS — BP 118/80 | HR 90 | Ht 72.0 in | Wt 291.0 lb

## 2020-06-21 DIAGNOSIS — L97212 Non-pressure chronic ulcer of right calf with fat layer exposed: Secondary | ICD-10-CM | POA: Diagnosis not present

## 2020-06-21 NOTE — Progress Notes (Signed)
History of Present Illness  There is no documented history at this time  Assessments & Plan   There are no diagnoses linked to this encounter.    Additional instructions  Subjective:  Patient presents with venous ulcer of the Bilateral lower extremity.    Procedure:  3 layer unna wrap was placed Bilateral lower extremity.   Plan:   Follow up in one week.  

## 2020-06-26 ENCOUNTER — Encounter (INDEPENDENT_AMBULATORY_CARE_PROVIDER_SITE_OTHER): Payer: Self-pay | Admitting: Nurse Practitioner

## 2020-06-28 ENCOUNTER — Encounter (INDEPENDENT_AMBULATORY_CARE_PROVIDER_SITE_OTHER): Payer: Self-pay | Admitting: Nurse Practitioner

## 2020-06-28 ENCOUNTER — Other Ambulatory Visit: Payer: Self-pay

## 2020-06-28 ENCOUNTER — Ambulatory Visit (INDEPENDENT_AMBULATORY_CARE_PROVIDER_SITE_OTHER): Payer: Medicare Other | Admitting: Nurse Practitioner

## 2020-06-28 VITALS — BP 116/81 | HR 72 | Ht 72.0 in | Wt 295.0 lb

## 2020-06-28 DIAGNOSIS — L97212 Non-pressure chronic ulcer of right calf with fat layer exposed: Secondary | ICD-10-CM | POA: Diagnosis not present

## 2020-06-28 NOTE — Progress Notes (Signed)
History of Present Illness  There is no documented history at this time  Assessments & Plan   There are no diagnoses linked to this encounter.    Additional instructions  Subjective:  Patient presents with venous ulcer of the Bilateral lower extremity.    Procedure:  3 layer unna wrap was placed Bilateral lower extremity.   Plan:   Follow up in one week.  

## 2020-07-05 ENCOUNTER — Other Ambulatory Visit: Payer: Self-pay

## 2020-07-05 ENCOUNTER — Encounter (INDEPENDENT_AMBULATORY_CARE_PROVIDER_SITE_OTHER): Payer: Self-pay | Admitting: Vascular Surgery

## 2020-07-05 ENCOUNTER — Ambulatory Visit (INDEPENDENT_AMBULATORY_CARE_PROVIDER_SITE_OTHER): Payer: Medicare Other | Admitting: Vascular Surgery

## 2020-07-05 VITALS — BP 131/80 | HR 82 | Resp 16 | Wt 293.0 lb

## 2020-07-05 DIAGNOSIS — M7989 Other specified soft tissue disorders: Secondary | ICD-10-CM

## 2020-07-05 DIAGNOSIS — M79604 Pain in right leg: Secondary | ICD-10-CM

## 2020-07-05 DIAGNOSIS — M79605 Pain in left leg: Secondary | ICD-10-CM

## 2020-07-05 DIAGNOSIS — I10 Essential (primary) hypertension: Secondary | ICD-10-CM | POA: Diagnosis not present

## 2020-07-05 DIAGNOSIS — L03115 Cellulitis of right lower limb: Secondary | ICD-10-CM | POA: Diagnosis not present

## 2020-07-05 NOTE — Assessment & Plan Note (Signed)
Worse.  Continue to wraps.  Recheck in 4 weeks.

## 2020-07-05 NOTE — Progress Notes (Signed)
[name] : [id number]  Warren Leblanc is a 75 y.o. ([birth year]) male who presents with chief complaint of  Chief Complaint  Patient presents with  . Follow-up    4wk unna boot check  .  History of Present Illness: Patient returns today in follow up of his lymphedema and leg swelling.  Not surprisingly, after his recent fall with arm fracture and resultant immobility and inability to use his lymphedema pumps, his swelling is worse.  No ulceration or infection.  Current Outpatient Medications  Medication Sig Dispense Refill  . ACIDOPHILUS LACTOBACILLUS PO Take by mouth.    Marland Kitchen amoxicillin (AMOXIL) 500 MG capsule amoxicillin 500 mg capsule    . Ascorbic Acid (VITAMIN C) 1000 MG tablet Take by mouth.    Marland Kitchen aspirin EC 81 MG tablet Take 81 mg by mouth daily.    . Biotin 2.5 MG TABS Take by mouth.    . Calcium Ascorbate 500 MG TABS Take by mouth.    . Cyanocobalamin (VITAMIN B 12 PO) Take by mouth.    . Ergocalciferol 2000 units TABS Take by mouth.    . folic acid (FOLVITE) 1 MG tablet Take by mouth.    . furosemide (LASIX) 40 MG tablet Take by mouth as needed.     . Ginger Oil OIL by Does not apply route daily.    Marland Kitchen lisinopril-hydrochlorothiazide (ZESTORETIC) 20-12.5 MG tablet Take 2 tablets by mouth daily.     . Magnesium 100 MG CAPS Take by mouth.    Marland Kitchen MAGNESIUM OXIDE PO Take by mouth.    Darlin Priestly POWD Take one capsule by mouth twice daily.    . Potassium 99 MG TABS Take by mouth.    . Taurine 1000 MG CAPS Take by mouth.    . traMADol (ULTRAM) 50 MG tablet Take 1 tablet (50 mg total) by mouth every 6 (six) hours as needed. 10 tablet 0  . TURMERIC PO Take by mouth.    Marland Kitchen UNABLE TO FIND Carnivora     No current facility-administered medications for this visit.    Past Medical History:  Diagnosis Date  . Hypertension     No past surgical history on file.   Social History   Tobacco Use  . Smoking status: Current Some Day Smoker  . Smokeless tobacco: Never Used  Substance Use  Topics  . Alcohol use: No  . Drug use: No      No family history on file.   Allergies  Allergen Reactions  . Amoxicillin   . Amoxicillin-Pot Clavulanate   . Antihistamines, Chlorpheniramine-Type Nausea Only  . Diphenhydramine Hcl     Other reaction(s): Other (See Comments) drowsy    .REVIEW OF SYSTEMS (Negative unless checked)  Constitutional: [] ?Weight loss  [] ?Fever  [] ?Chills Cardiac: [] ?Chest pain   [] ?Chest pressure   [] ?Palpitations   [] ?Shortness of breath when laying flat   [] ?Shortness of breath at rest   [] ?Shortness of breath with exertion. Vascular:  [x] ?Pain in legs with walking   [x] ?Pain in legs at rest   [] ?Pain in legs when laying flat   [] ?Claudication   [] ?Pain in feet when walking  [] ?Pain in feet at rest  [] ?Pain in feet when laying flat   [] ?History of DVT   [] ?Phlebitis   [x] ?Swelling in legs   [] ?Varicose veins   [] ?Non-healing ulcers Pulmonary:   [] ?Uses home oxygen   [] ?Productive cough   [] ?Hemoptysis   [] ?Wheeze  [] ?COPD   [] ?Asthma Neurologic:  [] ?Dizziness  [] ?  Blackouts   [] ?Seizures   [] ?History of stroke   [] ?History of TIA  [] ?Aphasia   [] ?Temporary blindness   [] ?Dysphagia   [] ?Weakness or numbness in arms   [] ?Weakness or numbness in legs Musculoskeletal:  [x] ?Arthritis   [] ?Joint swelling   [x] ?Joint pain   [] ?Low back pain Hematologic:  [] ?Easy bruising  [] ?Easy bleeding   [] ?Hypercoagulable state   [] ?Anemic   Gastrointestinal:  [] ?Blood in stool   [] ?Vomiting blood  [] ?Gastroesophageal reflux/heartburn   [] ?Abdominal pain Genitourinary:  [] ?Chronic kidney disease   [] ?Difficult urination  [] ?Frequent urination  [] ?Burning with urination   [] ?Hematuria Skin:  [x] ?Rashes   [] ?Ulcers   [] ?Wounds Psychological:  [] ?History of anxiety   [] ? History of major depression.  Physical Examination  BP 131/80 (BP Location: Right Arm)   Pulse 82   Resp 16   Wt 293 lb (132.9 kg)   BMI 39.74 kg/m  Gen:  WD/WN, NAD Head: [place]/AT, No temporalis  wasting. Ear/Nose/Throat: Hearing grossly intact, nares w/o erythema or drainage Eyes: Conjunctiva clear. Sclera non-icteric Neck: Supple.  Trachea midline Pulmonary:  Good air movement, no use of accessory muscles.  Cardiac: RRR, no JVD Vascular:  Vessel Right Left  Radial Palpable Palpable                       Musculoskeletal: M/S 5/5 throughout.  No deformity or atrophy. 3+ BLE edema. Neurologic: Sensation grossly intact in extremities.  Symmetrical.  Speech is fluent.  Psychiatric: Judgment intact, Mood & affect appropriate for pt's clinical situation. Dermatologic: No rashes or ulcers noted.  No cellulitis or open wounds.       Labs No results found for this or any previous visit (from the past 2160 hour(s)).  Radiology No results found.  Assessment/Plan Essential hypertension, benign blood pressure control important in reducing the progression of atherosclerotic disease. On appropriate oral medications.   Cellulitis of leg, right A few months ago, had acourse of amoxil, improved  Lymphedema Worse from his recent fall and his immobility.  Will need to continue to do Unna wraps. A three layer UNNA wrap was placed bilaterally today. Recheck in 4 weeks  No problem-specific Assessment & Plan notes found for this encounter.    , MD  07/05/2020 10:34 AM    This note was created with Dragon medical transcription system.  Any errors from dictation are purely unintentional

## 2020-07-12 ENCOUNTER — Encounter (INDEPENDENT_AMBULATORY_CARE_PROVIDER_SITE_OTHER): Payer: Medicare Other

## 2020-07-14 ENCOUNTER — Ambulatory Visit (INDEPENDENT_AMBULATORY_CARE_PROVIDER_SITE_OTHER): Payer: Medicare Other | Admitting: Nurse Practitioner

## 2020-07-14 ENCOUNTER — Other Ambulatory Visit: Payer: Self-pay

## 2020-07-14 VITALS — BP 129/84 | HR 82 | Ht 72.0 in | Wt 291.0 lb

## 2020-07-14 DIAGNOSIS — L97212 Non-pressure chronic ulcer of right calf with fat layer exposed: Secondary | ICD-10-CM

## 2020-07-14 NOTE — Progress Notes (Signed)
History of Present Illness  There is no documented history at this time  Assessments & Plan   There are no diagnoses linked to this encounter.    Additional instructions  Subjective:  Patient presents with venous ulcer of the Bilateral lower extremity.    Procedure:  3 layer unna wrap was placed Bilateral lower extremity.   Plan:   Follow up in one week.  

## 2020-07-17 ENCOUNTER — Encounter (INDEPENDENT_AMBULATORY_CARE_PROVIDER_SITE_OTHER): Payer: Self-pay | Admitting: Nurse Practitioner

## 2020-07-19 ENCOUNTER — Other Ambulatory Visit: Payer: Self-pay

## 2020-07-19 ENCOUNTER — Ambulatory Visit (INDEPENDENT_AMBULATORY_CARE_PROVIDER_SITE_OTHER): Payer: Medicare Other | Admitting: Nurse Practitioner

## 2020-07-19 VITALS — BP 148/83 | HR 83 | Resp 16 | Ht 72.0 in | Wt 293.0 lb

## 2020-07-19 DIAGNOSIS — L97212 Non-pressure chronic ulcer of right calf with fat layer exposed: Secondary | ICD-10-CM

## 2020-07-19 NOTE — Progress Notes (Signed)
History of Present Illness  There is no documented history at this time  Assessments & Plan   There are no diagnoses linked to this encounter.    Additional instructions  Subjective:  Patient presents with venous ulcer of the Bilateral lower extremity.    Procedure:  3 layer unna wrap was placed Bilateral lower extremity.   Plan:   Follow up in one week.  

## 2020-07-25 ENCOUNTER — Encounter (INDEPENDENT_AMBULATORY_CARE_PROVIDER_SITE_OTHER): Payer: Self-pay | Admitting: Nurse Practitioner

## 2020-07-26 ENCOUNTER — Other Ambulatory Visit: Payer: Self-pay

## 2020-07-26 ENCOUNTER — Ambulatory Visit (INDEPENDENT_AMBULATORY_CARE_PROVIDER_SITE_OTHER): Payer: Medicare Other | Admitting: Nurse Practitioner

## 2020-07-26 ENCOUNTER — Encounter (INDEPENDENT_AMBULATORY_CARE_PROVIDER_SITE_OTHER): Payer: Self-pay

## 2020-07-26 VITALS — BP 121/79 | HR 77 | Resp 16 | Wt 294.0 lb

## 2020-07-26 DIAGNOSIS — L97212 Non-pressure chronic ulcer of right calf with fat layer exposed: Secondary | ICD-10-CM

## 2020-07-26 NOTE — Progress Notes (Signed)
History of Present Illness  There is no documented history at this time  Assessments & Plan   There are no diagnoses linked to this encounter.    Additional instructions  Subjective:  Patient presents with venous ulcer of the Bilateral lower extremity.    Procedure:  3 layer unna wrap was placed Bilateral lower extremity.   Plan:   Follow up in one week.  

## 2020-08-01 ENCOUNTER — Encounter (INDEPENDENT_AMBULATORY_CARE_PROVIDER_SITE_OTHER): Payer: Self-pay | Admitting: Nurse Practitioner

## 2020-08-02 ENCOUNTER — Other Ambulatory Visit: Payer: Self-pay

## 2020-08-02 ENCOUNTER — Ambulatory Visit (INDEPENDENT_AMBULATORY_CARE_PROVIDER_SITE_OTHER): Payer: Medicare Other | Admitting: Vascular Surgery

## 2020-08-02 VITALS — BP 127/86 | HR 80 | Ht 72.0 in | Wt 289.0 lb

## 2020-08-02 DIAGNOSIS — L03115 Cellulitis of right lower limb: Secondary | ICD-10-CM | POA: Diagnosis not present

## 2020-08-02 DIAGNOSIS — I10 Essential (primary) hypertension: Secondary | ICD-10-CM

## 2020-08-02 DIAGNOSIS — I890 Lymphedema, not elsewhere classified: Secondary | ICD-10-CM | POA: Diagnosis not present

## 2020-08-02 DIAGNOSIS — M7989 Other specified soft tissue disorders: Secondary | ICD-10-CM | POA: Diagnosis not present

## 2020-08-02 NOTE — Assessment & Plan Note (Signed)
Swelling is a little bit better this time than his last visit but still quite pronounced.  3 layer Unna boot was placed today and will be changed weekly.  Recheck in about 4 weeks.

## 2020-08-02 NOTE — Progress Notes (Signed)
[name] : [id number]  Warren Leblanc is a 75 y.o. (1945-10-06) male who presents with chief complaint of  Chief Complaint  Patient presents with   Follow-up    4 week BIL unna boot check   .  History of Present Illness: Patient returns today in follow up of his lymphedema and leg swelling.  Its little better than it was a month ago.  The weekly Unna boots have helped but by enlarge, notices increased mobility over the past few weeks after his injury a few months ago.  No new ulceration or infection.  No fever or chills.  Current Outpatient Medications  Medication Sig Dispense Refill   ACIDOPHILUS LACTOBACILLUS PO Take by mouth.     amoxicillin (AMOXIL) 500 MG capsule amoxicillin 500 mg capsule     Ascorbic Acid (VITAMIN C) 1000 MG tablet Take by mouth.     aspirin EC 81 MG tablet Take 81 mg by mouth daily.     Biotin 2.5 MG TABS Take by mouth.     Calcium Ascorbate 500 MG TABS Take by mouth.     Cyanocobalamin (VITAMIN B 12 PO) Take by mouth.     Ergocalciferol 2000 units TABS Take by mouth.     folic acid (FOLVITE) 1 MG tablet Take by mouth.     furosemide (LASIX) 40 MG tablet Take by mouth as needed.      Ginger Oil OIL by Does not apply route daily.     lisinopril-hydrochlorothiazide (ZESTORETIC) 20-12.5 MG tablet Take 2 tablets by mouth daily.      Magnesium 100 MG CAPS Take by mouth.     MAGNESIUM OXIDE PO Take by mouth.     Nadide POWD Take one capsule by mouth twice daily.     Potassium 99 MG TABS Take by mouth.     Taurine 1000 MG CAPS Take by mouth.     traMADol (ULTRAM) 50 MG tablet Take 1 tablet (50 mg total) by mouth every 6 (six) hours as needed. 10 tablet 0   TURMERIC PO Take by mouth.     UNABLE TO FIND Carnivora     No current facility-administered medications for this visit.    Past Medical History:  Diagnosis Date   Hypertension     No past surgical history on file.   Social History   Tobacco Use   Smoking status: Some Days    Pack years: 0.00    Smokeless tobacco: Never  Substance Use Topics   Alcohol use: No   Drug use: No      Family History No bleeding or clotting disorders  Allergies  Allergen Reactions   Amoxicillin    Amoxicillin-Pot Clavulanate    Antihistamines, Chlorpheniramine-Type Nausea Only   Diphenhydramine Hcl     Other reaction(s): Other (See Comments) drowsy     .REVIEW OF SYSTEMS (Negative unless checked)   Constitutional: [] Weight loss  [] Fever  [] Chills Cardiac: [] Chest pain   [] Chest pressure   [] Palpitations   [] Shortness of breath when laying flat   [] Shortness of breath at rest   [] Shortness of breath with exertion. Vascular:  [x] Pain in legs with walking   [x] Pain in legs at rest   [] Pain in legs when laying flat   [] Claudication   [] Pain in feet when walking  [] Pain in feet at rest  [] Pain in feet when laying flat   [] History of DVT   [] Phlebitis   [x] Swelling in legs   [] Varicose veins   [] Non-healing  ulcers Pulmonary:   [] Uses home oxygen   [] Productive cough   [] Hemoptysis   [] Wheeze  [] COPD   [] Asthma Neurologic:  [] Dizziness  [] Blackouts   [] Seizures   [] History of stroke   [] History of TIA  [] Aphasia   [] Temporary blindness   [] Dysphagia   [] Weakness or numbness in arms   [] Weakness or numbness in legs Musculoskeletal:  [x] Arthritis   [] Joint swelling   [x] Joint pain   [] Low back pain Hematologic:  [] Easy bruising  [] Easy bleeding   [] Hypercoagulable state   [] Anemic   Gastrointestinal:  [] Blood in stool   [] Vomiting blood  [] Gastroesophageal reflux/heartburn   [] Abdominal pain Genitourinary:  [] Chronic kidney disease   [] Difficult urination  [] Frequent urination  [] Burning with urination   [] Hematuria Skin:  [x] Rashes   [] Ulcers   [] Wounds Psychological:  [] History of anxiety   []  History of major depression.  Physical Examination  BP 127/86   Pulse 80   Ht 6' (1.829 m)   Wt 289 lb (131.1 kg)   BMI 39.20 kg/m  Gen:  WD/WN, NAD Head: [place]/AT, No temporalis wasting. Ear/Nose/Throat:  Hearing grossly intact, nares w/o erythema or drainage Eyes: Conjunctiva clear. Sclera non-icteric Neck: Supple.  Trachea midline Pulmonary:  Good air movement, no use of accessory muscles.  Cardiac: RRR, no JVD Vascular:  Vessel Right Left  Radial Palpable Palpable                Musculoskeletal: M/S 5/5 throughout.  No deformity or atrophy. 2+ RLE edema, 2-3+ LLE edema. Neurologic: Sensation grossly intact in extremities.  Symmetrical.  Speech is fluent.  Psychiatric: Judgment intact, Mood & affect appropriate for pt's clinical situation. Dermatologic: No rashes or ulcers noted.  No cellulitis or open wounds.      Labs No results found for this or any previous visit (from the past 2160 hour(s)).  Radiology No results found.  Assessment/Plan Essential hypertension, benign blood pressure control important in reducing the progression of atherosclerotic disease. On appropriate oral medications.     Cellulitis of leg, right A few months ago, had a course of amoxil, improved   Lymphedema Worse from his recent fall and his immobility but now improving as his mobility improves.  Will need to continue to do Unna wraps. A three layer UNNA wrap was placed bilaterally today. Recheck in 4 weeks  Swelling of limb Swelling is a little bit better this time than his last visit but still quite pronounced.  3 layer Unna boot was placed today and will be changed weekly.  Recheck in about 4 weeks.    , MD  08/02/2020 4:07 PM    This note was created with Dragon medical transcription system.  Any errors from dictation are purely unintentional

## 2020-08-04 ENCOUNTER — Other Ambulatory Visit (HOSPITAL_COMMUNITY): Payer: Self-pay | Admitting: Orthopedic Surgery

## 2020-08-04 ENCOUNTER — Other Ambulatory Visit: Payer: Self-pay | Admitting: Orthopedic Surgery

## 2020-08-04 DIAGNOSIS — S42292D Other displaced fracture of upper end of left humerus, subsequent encounter for fracture with routine healing: Secondary | ICD-10-CM

## 2020-08-10 ENCOUNTER — Ambulatory Visit (INDEPENDENT_AMBULATORY_CARE_PROVIDER_SITE_OTHER): Payer: Medicare Other | Admitting: Nurse Practitioner

## 2020-08-10 ENCOUNTER — Other Ambulatory Visit: Payer: Self-pay

## 2020-08-10 VITALS — BP 127/85 | HR 85 | Ht 72.0 in | Wt 288.0 lb

## 2020-08-10 DIAGNOSIS — L97212 Non-pressure chronic ulcer of right calf with fat layer exposed: Secondary | ICD-10-CM

## 2020-08-10 NOTE — Progress Notes (Signed)
History of Present Illness  There is no documented history at this time  Assessments & Plan   There are no diagnoses linked to this encounter.    Additional instructions  Subjective:  Patient presents with venous ulcer of the Bilateral lower extremity.    Procedure:  3 layer unna wrap was placed Bilateral lower extremity.   Plan:   Follow up in one week.  

## 2020-08-14 ENCOUNTER — Encounter (INDEPENDENT_AMBULATORY_CARE_PROVIDER_SITE_OTHER): Payer: Self-pay | Admitting: Nurse Practitioner

## 2020-08-16 ENCOUNTER — Encounter (INDEPENDENT_AMBULATORY_CARE_PROVIDER_SITE_OTHER): Payer: Medicare Other

## 2020-08-17 ENCOUNTER — Encounter (INDEPENDENT_AMBULATORY_CARE_PROVIDER_SITE_OTHER): Payer: Self-pay

## 2020-08-17 ENCOUNTER — Ambulatory Visit (INDEPENDENT_AMBULATORY_CARE_PROVIDER_SITE_OTHER): Payer: Medicare Other | Admitting: Nurse Practitioner

## 2020-08-17 ENCOUNTER — Other Ambulatory Visit: Payer: Self-pay

## 2020-08-17 VITALS — BP 132/77 | HR 76 | Resp 16 | Wt 287.0 lb

## 2020-08-17 DIAGNOSIS — L97212 Non-pressure chronic ulcer of right calf with fat layer exposed: Secondary | ICD-10-CM | POA: Diagnosis not present

## 2020-08-17 NOTE — Progress Notes (Signed)
History of Present Illness  There is no documented history at this time  Assessments & Plan   There are no diagnoses linked to this encounter.    Additional instructions  Subjective:  Patient presents with venous ulcer of the Bilateral lower extremity.    Procedure:  3 layer unna wrap was placed Bilateral lower extremity.   Plan:   Follow up in one week.  

## 2020-08-20 ENCOUNTER — Encounter (INDEPENDENT_AMBULATORY_CARE_PROVIDER_SITE_OTHER): Payer: Self-pay | Admitting: Nurse Practitioner

## 2020-08-23 ENCOUNTER — Ambulatory Visit (INDEPENDENT_AMBULATORY_CARE_PROVIDER_SITE_OTHER): Payer: Medicare Other | Admitting: Nurse Practitioner

## 2020-08-23 ENCOUNTER — Other Ambulatory Visit: Payer: Self-pay

## 2020-08-23 VITALS — BP 143/87 | HR 87 | Ht 72.0 in | Wt 290.0 lb

## 2020-08-23 DIAGNOSIS — L97212 Non-pressure chronic ulcer of right calf with fat layer exposed: Secondary | ICD-10-CM | POA: Diagnosis not present

## 2020-08-23 NOTE — Progress Notes (Signed)
History of Present Illness  There is no documented history at this time  Assessments & Plan   There are no diagnoses linked to this encounter.    Additional instructions  Subjective:  Patient presents with venous ulcer of the Bilateral lower extremity.    Procedure:  3 layer unna wrap was placed Bilateral lower extremity.   Plan:   Follow up in one week.  

## 2020-08-29 ENCOUNTER — Encounter (INDEPENDENT_AMBULATORY_CARE_PROVIDER_SITE_OTHER): Payer: Self-pay | Admitting: Nurse Practitioner

## 2020-08-30 ENCOUNTER — Ambulatory Visit (INDEPENDENT_AMBULATORY_CARE_PROVIDER_SITE_OTHER): Payer: Medicare Other | Admitting: Vascular Surgery

## 2020-08-30 ENCOUNTER — Other Ambulatory Visit: Payer: Self-pay

## 2020-08-30 VITALS — BP 122/82 | HR 82 | Ht 72.0 in | Wt 285.0 lb

## 2020-08-30 DIAGNOSIS — I890 Lymphedema, not elsewhere classified: Secondary | ICD-10-CM

## 2020-08-30 DIAGNOSIS — L03115 Cellulitis of right lower limb: Secondary | ICD-10-CM | POA: Diagnosis not present

## 2020-08-30 DIAGNOSIS — I10 Essential (primary) hypertension: Secondary | ICD-10-CM

## 2020-08-30 DIAGNOSIS — M7989 Other specified soft tissue disorders: Secondary | ICD-10-CM | POA: Diagnosis not present

## 2020-08-30 NOTE — Progress Notes (Signed)
[name] : [id number]  Warren Leblanc is a 75 y.o. ([birth year]) male who presents with chief complaint of  Chief Complaint  Patient presents with   Follow-up    4 wk bil unna boot check  .  History of Present Illness: Patient returns today in follow up of his lymphedema and chronic leg swelling.  He has been in [employer] with weekly changes for the past several weeks since his last visit.  No real progression of swelling or improvement of swelling in that timeframe.  He is still recovering from his left arm shoulder injury and his mobility is not quite up to par yet.  No new ulcerations or infection.  No fevers or chills.  Current Outpatient Medications  Medication Sig Dispense Refill   ACIDOPHILUS LACTOBACILLUS PO Take by mouth.     amoxicillin (AMOXIL) 500 MG capsule amoxicillin 500 mg capsule     Ascorbic Acid (VITAMIN C) 1000 MG tablet Take by mouth.     aspirin EC 81 MG tablet Take 81 mg by mouth daily.     Biotin 2.5 MG TABS Take by mouth.     Calcium Ascorbate 500 MG TABS Take by mouth.     Cyanocobalamin (VITAMIN B 12 PO) Take by mouth.     Ergocalciferol 2000 units TABS Take by mouth.     folic acid (FOLVITE) 1 MG tablet Take by mouth.     furosemide (LASIX) 40 MG tablet Take by mouth as needed.      Ginger Oil OIL by Does not apply route daily.     lisinopril-hydrochlorothiazide (ZESTORETIC) 20-12.5 MG tablet Take 2 tablets by mouth daily.      Magnesium 100 MG CAPS Take by mouth.     MAGNESIUM OXIDE PO Take by mouth.     Nadide POWD Take one capsule by mouth twice daily.     Potassium 99 MG TABS Take by mouth.     Taurine 1000 MG CAPS Take by mouth.     traMADol (ULTRAM) 50 MG tablet Take 1 tablet (50 mg total) by mouth every 6 (six) hours as needed. 10 tablet 0   TURMERIC PO Take by mouth.     UNABLE TO FIND Carnivora     No current facility-administered medications for this visit.    Past Medical History:  Diagnosis Date   Hypertension     No past surgical  history on file.   Social History         Tobacco Use   Smoking status: Some Days      Pack years: 0.00   Smokeless tobacco: Never  Substance Use Topics   Alcohol use: No   Drug use: No          Family History No bleeding or clotting disorders        Allergies  Allergen Reactions   Amoxicillin     Amoxicillin-Pot Clavulanate     Antihistamines, Chlorpheniramine-Type Nausea Only   Diphenhydramine Hcl        Other reaction(s): Other (See Comments) drowsy        .REVIEW OF SYSTEMS (Negative unless checked)   Constitutional: [] Weight loss  [] Fever  [] Chills Cardiac: [] Chest pain   [] Chest pressure   [] Palpitations   [] Shortness of breath when laying flat   [] Shortness of breath at rest   [] Shortness of breath with exertion. Vascular:  [x] Pain in legs with walking   [x] Pain in legs at rest   [] Pain in legs when laying  flat   [] Claudication   [] Pain in feet when walking  [] Pain in feet at rest  [] Pain in feet when laying flat   [] History of DVT   [] Phlebitis   [x] Swelling in legs   [] Varicose veins   [] Non-healing ulcers Pulmonary:   [] Uses home oxygen   [] Productive cough   [] Hemoptysis   [] Wheeze  [] COPD   [] Asthma Neurologic:  [] Dizziness  [] Blackouts   [] Seizures   [] History of stroke   [] History of TIA  [] Aphasia   [] Temporary blindness   [] Dysphagia   [] Weakness or numbness in arms   [] Weakness or numbness in legs Musculoskeletal:  [x] Arthritis   [] Joint swelling   [x] Joint pain   [] Low back pain Hematologic:  [] Easy bruising  [] Easy bleeding   [] Hypercoagulable state   [] Anemic   Gastrointestinal:  [] Blood in stool   [] Vomiting blood  [] Gastroesophageal reflux/heartburn   [] Abdominal pain Genitourinary:  [] Chronic kidney disease   [] Difficult urination  [] Frequent urination  [] Burning with urination   [] Hematuria Skin:  [x] Rashes   [] Ulcers   [] Wounds Psychological:  [] History of anxiety   []  History of major depression  Physical Examination  BP 122/82   Pulse 82    Ht 6' (1.829 m)   Wt 285 lb (129.3 kg)   BMI 38.65 kg/m  Gen:  WD/WN, NAD Head: [place]/AT, No temporalis wasting. Ear/Nose/Throat: Hearing grossly intact, nares w/o erythema or drainage Eyes: Conjunctiva clear. Sclera non-icteric Neck: Supple.  Trachea midline Pulmonary:  Good air movement, no use of accessory muscles.  Cardiac: RRR, no JVD Vascular:  Vessel Right Left  Radial Palpable Palpable               Musculoskeletal: M/S 5/5 throughout.  No deformity or atrophy.  3+ bilateral lower extremity edema. Neurologic: Sensation grossly intact in extremities.  Symmetrical.  Speech is fluent.  Psychiatric: Judgment intact, Mood & affect appropriate for pt's clinical situation. Dermatologic: No rashes or ulcers noted.  No cellulitis or open wounds.      Labs No results found for this or any previous visit (from the past 2160 hour(s)).  Radiology No results found.  Assessment/Plan Essential hypertension, benign blood pressure control important in reducing the progression of atherosclerotic disease. On appropriate oral medications.     Cellulitis of leg, right Several months ago, had a course of amoxil, improved   Lymphedema Worse from his recent fall and his immobility but now improving as his mobility improves.  Will need to continue to do Unna wraps. A three layer UNNA wrap was placed bilaterally today. Recheck in 4 weeks     , MD  08/30/2020 10:32 AM    This note was created with Dragon medical transcription system.  Any errors from dictation are purely unintentional

## 2020-09-06 ENCOUNTER — Encounter (INDEPENDENT_AMBULATORY_CARE_PROVIDER_SITE_OTHER): Payer: Self-pay | Admitting: Nurse Practitioner

## 2020-09-06 ENCOUNTER — Other Ambulatory Visit: Payer: Self-pay

## 2020-09-06 ENCOUNTER — Ambulatory Visit (INDEPENDENT_AMBULATORY_CARE_PROVIDER_SITE_OTHER): Payer: Medicare Other | Admitting: Nurse Practitioner

## 2020-09-06 VITALS — BP 130/94 | HR 70 | Ht 72.0 in | Wt 287.0 lb

## 2020-09-06 DIAGNOSIS — I890 Lymphedema, not elsewhere classified: Secondary | ICD-10-CM

## 2020-09-06 NOTE — Progress Notes (Signed)
History of Present Illness  There is no documented history at this time  Assessments & Plan   There are no diagnoses linked to this encounter.    Additional instructions  Subjective:  Patient presents with venous ulcer of the Bilateral lower extremity.    Procedure:  3 layer unna wrap was placed Bilateral lower extremity.   Plan:   Follow up in one week.  

## 2020-09-13 ENCOUNTER — Encounter (INDEPENDENT_AMBULATORY_CARE_PROVIDER_SITE_OTHER): Payer: Self-pay

## 2020-09-13 ENCOUNTER — Ambulatory Visit (INDEPENDENT_AMBULATORY_CARE_PROVIDER_SITE_OTHER): Payer: Medicare Other | Admitting: Nurse Practitioner

## 2020-09-13 ENCOUNTER — Other Ambulatory Visit: Payer: Self-pay

## 2020-09-13 VITALS — BP 130/75 | HR 75 | Resp 16 | Wt 288.6 lb

## 2020-09-13 DIAGNOSIS — L97212 Non-pressure chronic ulcer of right calf with fat layer exposed: Secondary | ICD-10-CM

## 2020-09-13 NOTE — Progress Notes (Signed)
History of Present Illness  There is no documented history at this time  Assessments & Plan   There are no diagnoses linked to this encounter.    Additional instructions  Subjective:  Patient presents with venous ulcer of the Bilateral lower extremity.    Procedure:  3 layer unna wrap was placed Bilateral lower extremity.   Plan:   Follow up in one week.  

## 2020-09-18 ENCOUNTER — Encounter (INDEPENDENT_AMBULATORY_CARE_PROVIDER_SITE_OTHER): Payer: Self-pay | Admitting: Nurse Practitioner

## 2020-09-20 ENCOUNTER — Ambulatory Visit (INDEPENDENT_AMBULATORY_CARE_PROVIDER_SITE_OTHER): Payer: Medicare Other | Admitting: Nurse Practitioner

## 2020-09-20 ENCOUNTER — Encounter (INDEPENDENT_AMBULATORY_CARE_PROVIDER_SITE_OTHER): Payer: Self-pay

## 2020-09-20 ENCOUNTER — Other Ambulatory Visit: Payer: Self-pay

## 2020-09-20 VITALS — BP 126/85 | HR 64 | Resp 16 | Wt 287.0 lb

## 2020-09-20 DIAGNOSIS — L97212 Non-pressure chronic ulcer of right calf with fat layer exposed: Secondary | ICD-10-CM | POA: Diagnosis not present

## 2020-09-20 NOTE — Progress Notes (Signed)
History of Present Illness  There is no documented history at this time  Assessments & Plan   There are no diagnoses linked to this encounter.    Additional instructions  Subjective:  Patient presents with venous ulcer of the Bilateral lower extremity.    Procedure:  3 layer unna wrap was placed Bilateral lower extremity.   Plan:   Follow up in one week.  

## 2020-09-27 ENCOUNTER — Other Ambulatory Visit: Payer: Self-pay

## 2020-09-27 ENCOUNTER — Ambulatory Visit (INDEPENDENT_AMBULATORY_CARE_PROVIDER_SITE_OTHER): Payer: Medicare Other | Admitting: Vascular Surgery

## 2020-09-27 VITALS — BP 125/89 | HR 86 | Ht 72.0 in | Wt 288.0 lb

## 2020-09-27 DIAGNOSIS — I10 Essential (primary) hypertension: Secondary | ICD-10-CM

## 2020-09-27 DIAGNOSIS — L03115 Cellulitis of right lower limb: Secondary | ICD-10-CM

## 2020-09-27 DIAGNOSIS — I890 Lymphedema, not elsewhere classified: Secondary | ICD-10-CM | POA: Diagnosis not present

## 2020-09-27 NOTE — Assessment & Plan Note (Signed)
His symptoms remain suboptimally controlled and we will continue weekly Unna boots.  3 layer Unna boots were placed bilaterally today and will be changed weekly.  He is going to get back into his lymphedema pump as soon as his arm will allow this.  I do think this will be of significant benefit.  Return to clinic in 4 weeks.

## 2020-09-27 NOTE — Progress Notes (Signed)
[name] : [id number]  Warren Leblanc is a 75 y.o. (Jul 31, 1945) male who presents with chief complaint of  Chief Complaint  Patient presents with   Follow-up    4wk BIL unna boot  check  .  History of Present Illness: Patient returns today in follow up of leg swelling and lymphedema.  He has not been able to use his pump since his arm injury almost 3 months ago.  Swelling has been quite significant since that time.  It is about the same today as it has been the last several weeks.  No new ulceration or infection.  Current Outpatient Medications  Medication Sig Dispense Refill   ACIDOPHILUS LACTOBACILLUS PO Take by mouth.     amoxicillin (AMOXIL) 500 MG capsule amoxicillin 500 mg capsule     Ascorbic Acid (VITAMIN C) 1000 MG tablet Take by mouth.     aspirin EC 81 MG tablet Take 81 mg by mouth daily.     Biotin 2.5 MG TABS Take by mouth.     Calcium Ascorbate 500 MG TABS Take by mouth.     Cyanocobalamin (VITAMIN B 12 PO) Take by mouth.     Ergocalciferol 2000 units TABS Take by mouth.     folic acid (FOLVITE) 1 MG tablet Take by mouth.     furosemide (LASIX) 40 MG tablet Take by mouth as needed.      Ginger Oil OIL by Does not apply route daily.     lisinopril-hydrochlorothiazide (ZESTORETIC) 20-12.5 MG tablet Take 2 tablets by mouth daily.      Magnesium 100 MG CAPS Take by mouth.     MAGNESIUM OXIDE PO Take by mouth.     Nadide POWD Take one capsule by mouth twice daily.     Potassium 99 MG TABS Take by mouth.     Taurine 1000 MG CAPS Take by mouth.     traMADol (ULTRAM) 50 MG tablet Take 1 tablet (50 mg total) by mouth every 6 (six) hours as needed. 10 tablet 0   TURMERIC PO Take by mouth.     UNABLE TO FIND [employer]     No current facility-administered medications for this visit.    Past Medical History:  Diagnosis Date   Hypertension     No past surgical history on file.   Social History   Tobacco Use   Smoking status: Some Days   Smokeless tobacco: Never   Substance Use Topics   Alcohol use: No   Drug use: No      No family history on file.   Allergies  Allergen Reactions   Amoxicillin    Amoxicillin-Pot Clavulanate    Antihistamines, Chlorpheniramine-Type Nausea Only   Diphenhydramine Hcl     Other reaction(s): Other (See Comments) drowsy    .REVIEW OF SYSTEMS (Negative unless checked)   Constitutional: [] Weight loss  [] Fever  [] Chills Cardiac: [] Chest pain   [] Chest pressure   [] Palpitations   [] Shortness of breath when laying flat   [] Shortness of breath at rest   [] Shortness of breath with exertion. Vascular:  [x] Pain in legs with walking   [x] Pain in legs at rest   [] Pain in legs when laying flat   [] Claudication   [] Pain in feet when walking  [] Pain in feet at rest  [] Pain in feet when laying flat   [] History of DVT   [] Phlebitis   [x] Swelling in legs   [] Varicose veins   [] Non-healing ulcers Pulmonary:   [] Uses home oxygen   []   Productive cough   [] Hemoptysis   [] Wheeze  [] COPD   [] Asthma Neurologic:  [] Dizziness  [] Blackouts   [] Seizures   [] History of stroke   [] History of TIA  [] Aphasia   [] Temporary blindness   [] Dysphagia   [] Weakness or numbness in arms   [] Weakness or numbness in legs Musculoskeletal:  [x] Arthritis   [] Joint swelling   [x] Joint pain   [] Low back pain Hematologic:  [] Easy bruising  [] Easy bleeding   [] Hypercoagulable state   [] Anemic   Gastrointestinal:  [] Blood in stool   [] Vomiting blood  [] Gastroesophageal reflux/heartburn   [] Abdominal pain Genitourinary:  [] Chronic kidney disease   [] Difficult urination  [] Frequent urination  [] Burning with urination   [] Hematuria Skin:  [x] Rashes   [] Ulcers   [] Wounds Psychological:  [] History of anxiety   []  History of major depression  Physical Examination  BP 125/89   Pulse 86   Ht 6' (1.829 m)   Wt 288 lb (130.6 kg)   BMI 39.06 kg/m  Gen:  WD/WN, NAD Head: [place]/AT, No temporalis wasting. Ear/Nose/Throat: Hearing grossly intact, nares w/o erythema or  drainage Eyes: Conjunctiva clear. Sclera non-icteric Neck: Supple.  Trachea midline Pulmonary:  Good air movement, no use of accessory muscles.  Cardiac: RRR, no JVD Vascular:  Vessel Right Left  Radial Palpable Palpable           Musculoskeletal: M/S 5/5 throughout.  No deformity or atrophy.  3+ bilateral lower extremity edema. Neurologic: Sensation grossly intact in extremities.  Symmetrical.  Speech is fluent.  Psychiatric: Judgment intact, Mood & affect appropriate for pt's clinical situation. Dermatologic: No rashes or ulcers noted.  No cellulitis or open wounds.      Labs No results found for this or any previous visit (from the past 2160 hour(s)).  Radiology No results found.  Assessment/Plan Essential hypertension, benign blood pressure control important in reducing the progression of atherosclerotic disease. On appropriate oral medications.     Cellulitis of leg, right Several months ago, had a course of amoxil, improved  Lymphedema His symptoms remain suboptimally controlled and we will continue weekly Unna boots.  3 layer Unna boots were placed bilaterally today and will be changed weekly.  He is going to get back into his lymphedema pump as soon as his arm will allow this.  I do think this will be of significant benefit.  Return to clinic in 4 weeks.    , MD  09/27/2020 12:13 PM    This note was created with Dragon medical transcription system.  Any errors from dictation are purely unintentional

## 2020-10-04 ENCOUNTER — Other Ambulatory Visit: Payer: Self-pay

## 2020-10-04 ENCOUNTER — Encounter (INDEPENDENT_AMBULATORY_CARE_PROVIDER_SITE_OTHER): Payer: Self-pay

## 2020-10-04 ENCOUNTER — Ambulatory Visit (INDEPENDENT_AMBULATORY_CARE_PROVIDER_SITE_OTHER): Payer: Medicare Other | Admitting: Nurse Practitioner

## 2020-10-04 VITALS — BP 148/96 | HR 81 | Resp 16 | Wt 291.8 lb

## 2020-10-04 DIAGNOSIS — L97212 Non-pressure chronic ulcer of right calf with fat layer exposed: Secondary | ICD-10-CM | POA: Diagnosis not present

## 2020-10-04 NOTE — Progress Notes (Signed)
History of Present Illness  There is no documented history at this time  Assessments & Plan   There are no diagnoses linked to this encounter.    Additional instructions  Subjective:  Patient presents with venous ulcer of the Bilateral lower extremity.    Procedure:  3 layer unna wrap was placed Bilateral lower extremity.   Plan:   Follow up in one week.  

## 2020-10-11 ENCOUNTER — Other Ambulatory Visit: Payer: Self-pay

## 2020-10-11 ENCOUNTER — Ambulatory Visit (INDEPENDENT_AMBULATORY_CARE_PROVIDER_SITE_OTHER): Payer: Medicare Other | Admitting: Nurse Practitioner

## 2020-10-11 VITALS — BP 131/85 | HR 80 | Ht 72.0 in | Wt 281.0 lb

## 2020-10-11 DIAGNOSIS — L97212 Non-pressure chronic ulcer of right calf with fat layer exposed: Secondary | ICD-10-CM | POA: Diagnosis not present

## 2020-10-11 NOTE — Progress Notes (Signed)
History of Present Illness  There is no documented history at this time  Assessments & Plan   There are no diagnoses linked to this encounter.    Additional instructions  Subjective:  Patient presents with venous ulcer of the Bilateral lower extremity.    Procedure:  3 layer unna wrap was placed Bilateral lower extremity.   Plan:   Follow up in one week.  

## 2020-10-16 ENCOUNTER — Encounter (INDEPENDENT_AMBULATORY_CARE_PROVIDER_SITE_OTHER): Payer: Self-pay | Admitting: Nurse Practitioner

## 2020-10-18 ENCOUNTER — Other Ambulatory Visit: Payer: Self-pay

## 2020-10-18 ENCOUNTER — Ambulatory Visit (INDEPENDENT_AMBULATORY_CARE_PROVIDER_SITE_OTHER): Payer: Medicare Other | Admitting: Nurse Practitioner

## 2020-10-18 VITALS — BP 131/81 | HR 58 | Ht 72.0 in | Wt 285.0 lb

## 2020-10-18 DIAGNOSIS — L97212 Non-pressure chronic ulcer of right calf with fat layer exposed: Secondary | ICD-10-CM

## 2020-10-18 NOTE — Progress Notes (Signed)
History of Present Illness  There is no documented history at this time  Assessments & Plan   There are no diagnoses linked to this encounter.    Additional instructions  Subjective:  Patient presents with venous ulcer of the Bilateral lower extremity.    Procedure:  3 layer unna wrap was placed Bilateral lower extremity.   Plan:   Follow up in one week.  

## 2020-10-23 ENCOUNTER — Encounter (INDEPENDENT_AMBULATORY_CARE_PROVIDER_SITE_OTHER): Payer: Self-pay | Admitting: Nurse Practitioner

## 2020-10-25 ENCOUNTER — Ambulatory Visit (INDEPENDENT_AMBULATORY_CARE_PROVIDER_SITE_OTHER): Payer: Medicare Other | Admitting: Vascular Surgery

## 2020-10-25 ENCOUNTER — Other Ambulatory Visit: Payer: Self-pay

## 2020-10-25 VITALS — BP 136/85 | HR 65 | Ht 72.0 in | Wt 285.0 lb

## 2020-10-25 DIAGNOSIS — M79604 Pain in right leg: Secondary | ICD-10-CM

## 2020-10-25 DIAGNOSIS — M79605 Pain in left leg: Secondary | ICD-10-CM

## 2020-10-25 DIAGNOSIS — L03116 Cellulitis of left lower limb: Secondary | ICD-10-CM | POA: Diagnosis not present

## 2020-10-25 DIAGNOSIS — I890 Lymphedema, not elsewhere classified: Secondary | ICD-10-CM | POA: Diagnosis not present

## 2020-10-25 DIAGNOSIS — I10 Essential (primary) hypertension: Secondary | ICD-10-CM | POA: Diagnosis not present

## 2020-10-25 NOTE — Progress Notes (Signed)
[name] : [id number]  Warren Leblanc is a 75 y.o. (10-06-1945) male who presents with chief complaint of  Chief Complaint  Patient presents with   Follow-up    4 week BIL unna boot check  .  History of Present Illness: Patient returns today in follow up of his leg swelling and lymphedema.  He just started using his lymphedema pump last night.  He has been unable to use it for several months due to a left arm fracture after a fall.  His swelling got a lot worse after the fall.  He reports no current ulcerations or wounds.  His swelling is still pretty significant although it may be slightly better today.  No fevers or chills or signs of infection  Current Outpatient Medications  Medication Sig Dispense Refill   ACIDOPHILUS LACTOBACILLUS PO Take by mouth.     amoxicillin (AMOXIL) 500 MG capsule amoxicillin 500 mg capsule     Ascorbic Acid (VITAMIN C) 1000 MG tablet Take by mouth.     aspirin EC 81 MG tablet Take 81 mg by mouth daily.     Biotin 2.5 MG TABS Take by mouth.     Calcium Ascorbate 500 MG TABS Take by mouth.     Cyanocobalamin (VITAMIN B 12 PO) Take by mouth.     Ergocalciferol 2000 units TABS Take by mouth.     folic acid (FOLVITE) 1 MG tablet Take by mouth.     furosemide (LASIX) 40 MG tablet Take by mouth as needed.      Ginger Oil OIL by Does not apply route daily.     lisinopril-hydrochlorothiazide (ZESTORETIC) 20-12.5 MG tablet Take 2 tablets by mouth daily.      Magnesium 100 MG CAPS Take by mouth.     MAGNESIUM OXIDE PO Take by mouth.     Nadide POWD Take one capsule by mouth twice daily.     Potassium 99 MG TABS Take by mouth.     Taurine 1000 MG CAPS Take by mouth.     traMADol (ULTRAM) 50 MG tablet Take 1 tablet (50 mg total) by mouth every 6 (six) hours as needed. 10 tablet 0   TURMERIC PO Take by mouth.     UNABLE TO FIND [employer]     No current facility-administered medications for this visit.    Past Medical History:  Diagnosis Date   Hypertension      No past surgical history on file.   Social History   Tobacco Use   Smoking status: Some Days   Smokeless tobacco: Never  Substance Use Topics   Alcohol use: No   Drug use: No      No family history on file.   Allergies  Allergen Reactions   Amoxicillin    Amoxicillin-Pot Clavulanate    Antihistamines, Chlorpheniramine-Type Nausea Only   Diphenhydramine Hcl     Other reaction(s): Other (See Comments) drowsy    .REVIEW OF SYSTEMS (Negative unless checked)   Constitutional: [] Weight loss  [] Fever  [] Chills Cardiac: [] Chest pain   [] Chest pressure   [] Palpitations   [] Shortness of breath when laying flat   [] Shortness of breath at rest   [] Shortness of breath with exertion. Vascular:  [x] Pain in legs with walking   [x] Pain in legs at rest   [] Pain in legs when laying flat   [] Claudication   [] Pain in feet when walking  [] Pain in feet at rest  [] Pain in feet when laying flat   [] History of  DVT   [] Phlebitis   [x] Swelling in legs   [] Varicose veins   [] Non-healing ulcers Pulmonary:   [] Uses home oxygen   [] Productive cough   [] Hemoptysis   [] Wheeze  [] COPD   [] Asthma Neurologic:  [] Dizziness  [] Blackouts   [] Seizures   [] History of stroke   [] History of TIA  [] Aphasia   [] Temporary blindness   [] Dysphagia   [] Weakness or numbness in arms   [] Weakness or numbness in legs Musculoskeletal:  [x] Arthritis   [] Joint swelling   [x] Joint pain   [] Low back pain Hematologic:  [] Easy bruising  [] Easy bleeding   [] Hypercoagulable state   [] Anemic   Gastrointestinal:  [] Blood in stool   [] Vomiting blood  [] Gastroesophageal reflux/heartburn   [] Abdominal pain Genitourinary:  [] Chronic kidney disease   [] Difficult urination  [] Frequent urination  [] Burning with urination   [] Hematuria Skin:  [x] Rashes   [] Ulcers   [] Wounds Psychological:  [] History of anxiety   []  History of major depression  Physical Examination  BP 136/85   Pulse 65   Ht 6' (1.829 m)   Wt 285 lb (129.3 kg)   BMI  38.65 kg/m  Gen:  WD/WN, NAD Head: [place]/AT, No temporalis wasting. Ear/Nose/Throat: Hearing grossly intact, nares w/o erythema or drainage Eyes: Conjunctiva clear. Sclera non-icteric Neck: Supple.  Trachea midline Pulmonary:  Good air movement, no use of accessory muscles.  Cardiac: RRR, no JVD Vascular:  Vessel Right Left  Radial Palpable Palpable           Musculoskeletal: M/S 5/5 throughout.  No deformity or atrophy. 2-3+ RLE edema, 3+ LLE edema. Neurologic: Sensation grossly intact in extremities.  Symmetrical.  Speech is fluent.  Psychiatric: Judgment intact, Mood & affect appropriate for pt's clinical situation. Dermatologic: No rashes or ulcers noted.  No cellulitis or open wounds.      Labs No results found for this or any previous visit (from the past 2160 hour(s)).  Radiology No results found.  Assessment/Plan Essential hypertension, benign blood pressure control important in reducing the progression of atherosclerotic disease. On appropriate oral medications.     Cellulitis of leg, right Several months ago, had a course of amoxil, improved   Lymphedema His symptoms remain suboptimally controlled and we will continue weekly Unna boots.  3 layer Unna boots were placed bilaterally today and will be changed weekly.  He is going to get back into his lymphedema pump every day and maybe twice daily.  I do think this will be of significant benefit.  Return to clinic in 4 weeks.    , MD  10/25/2020 12:24 PM    This note was created with Dragon medical transcription system.  Any errors from dictation are purely unintentional

## 2020-11-01 ENCOUNTER — Other Ambulatory Visit: Payer: Self-pay

## 2020-11-01 ENCOUNTER — Ambulatory Visit (INDEPENDENT_AMBULATORY_CARE_PROVIDER_SITE_OTHER): Payer: Medicare Other | Admitting: Nurse Practitioner

## 2020-11-01 ENCOUNTER — Encounter (INDEPENDENT_AMBULATORY_CARE_PROVIDER_SITE_OTHER): Payer: Self-pay | Admitting: Nurse Practitioner

## 2020-11-01 VITALS — BP 149/80 | HR 67 | Resp 16 | Wt 282.0 lb

## 2020-11-01 DIAGNOSIS — L97212 Non-pressure chronic ulcer of right calf with fat layer exposed: Secondary | ICD-10-CM | POA: Diagnosis not present

## 2020-11-01 NOTE — Progress Notes (Signed)
History of Present Illness  There is no documented history at this time  Assessments & Plan   There are no diagnoses linked to this encounter.    Additional instructions  Subjective:  Patient presents with venous ulcer of the Bilateral lower extremity.    Procedure:  3 layer unna wrap was placed Bilateral lower extremity.   Plan:   Follow up in one week.  

## 2020-11-08 ENCOUNTER — Ambulatory Visit (INDEPENDENT_AMBULATORY_CARE_PROVIDER_SITE_OTHER): Payer: Medicare Other | Admitting: Nurse Practitioner

## 2020-11-08 ENCOUNTER — Encounter (INDEPENDENT_AMBULATORY_CARE_PROVIDER_SITE_OTHER): Payer: Self-pay

## 2020-11-08 ENCOUNTER — Other Ambulatory Visit: Payer: Self-pay

## 2020-11-08 VITALS — BP 137/82 | HR 65 | Ht 72.0 in | Wt 283.0 lb

## 2020-11-08 DIAGNOSIS — L97212 Non-pressure chronic ulcer of right calf with fat layer exposed: Secondary | ICD-10-CM

## 2020-11-08 NOTE — Progress Notes (Signed)
History of Present Illness  There is no documented history at this time  Assessments & Plan   There are no diagnoses linked to this encounter.    Additional instructions  Subjective:  Patient presents with venous ulcer of the Bilateral lower extremity.    Procedure:  3 layer unna wrap was placed Bilateral lower extremity.   Plan:   Follow up in one week.  

## 2020-11-14 ENCOUNTER — Encounter (INDEPENDENT_AMBULATORY_CARE_PROVIDER_SITE_OTHER): Payer: Self-pay | Admitting: Nurse Practitioner

## 2020-11-15 ENCOUNTER — Ambulatory Visit (INDEPENDENT_AMBULATORY_CARE_PROVIDER_SITE_OTHER): Payer: Medicare Other | Admitting: Nurse Practitioner

## 2020-11-15 ENCOUNTER — Other Ambulatory Visit: Payer: Self-pay

## 2020-11-15 VITALS — BP 132/85 | HR 70 | Ht 72.0 in | Wt 277.0 lb

## 2020-11-15 DIAGNOSIS — L97212 Non-pressure chronic ulcer of right calf with fat layer exposed: Secondary | ICD-10-CM | POA: Diagnosis not present

## 2020-11-15 NOTE — Progress Notes (Signed)
History of Present Illness  There is no documented history at this time  Assessments & Plan   There are no diagnoses linked to this encounter.    Additional instructions  Subjective:  Patient presents with venous ulcer of the Bilateral lower extremity.    Procedure:  3 layer unna wrap was placed Bilateral lower extremity.   Plan:   Follow up in one week.  

## 2020-11-22 ENCOUNTER — Ambulatory Visit (INDEPENDENT_AMBULATORY_CARE_PROVIDER_SITE_OTHER): Payer: Medicare Other | Admitting: Vascular Surgery

## 2020-11-22 ENCOUNTER — Encounter (INDEPENDENT_AMBULATORY_CARE_PROVIDER_SITE_OTHER): Payer: Self-pay | Admitting: Vascular Surgery

## 2020-11-22 ENCOUNTER — Encounter (INDEPENDENT_AMBULATORY_CARE_PROVIDER_SITE_OTHER): Payer: Self-pay | Admitting: Nurse Practitioner

## 2020-11-22 ENCOUNTER — Other Ambulatory Visit: Payer: Self-pay

## 2020-11-22 VITALS — BP 139/81 | HR 65 | Ht 72.0 in | Wt 280.0 lb

## 2020-11-22 DIAGNOSIS — I10 Essential (primary) hypertension: Secondary | ICD-10-CM | POA: Diagnosis not present

## 2020-11-22 DIAGNOSIS — L03115 Cellulitis of right lower limb: Secondary | ICD-10-CM

## 2020-11-22 DIAGNOSIS — M7989 Other specified soft tissue disorders: Secondary | ICD-10-CM

## 2020-11-22 DIAGNOSIS — I890 Lymphedema, not elsewhere classified: Secondary | ICD-10-CM

## 2020-11-22 NOTE — Progress Notes (Signed)
[name] : [id number]  Warren Leblanc is a 75 y.o. ([birth year]) male who presents with chief complaint of  Chief Complaint  Patient presents with   Follow-up    4 wk bil unna boot check  .  History of Present Illness: Patient returns today in follow up of his lymphedema.  He is resumed using his lymphedema pumps regularly.  His legs are really about the same.  He is very frustrated and disappointed not to be seeing more progress with his legs.  No new ulceration.  Unna boots have kept the swelling from worsening, but they are really not a lot better.  Current Outpatient Medications  Medication Sig Dispense Refill   ACIDOPHILUS LACTOBACILLUS PO Take by mouth.     amoxicillin (AMOXIL) 500 MG capsule amoxicillin 500 mg capsule     Ascorbic Acid (VITAMIN C) 1000 MG tablet Take by mouth.     aspirin EC 81 MG tablet Take 81 mg by mouth daily.     Biotin 2.5 MG TABS Take by mouth.     Calcium Ascorbate 500 MG TABS Take by mouth.     Cyanocobalamin (VITAMIN B 12 PO) Take by mouth.     Ergocalciferol 2000 units TABS Take by mouth.     folic acid (FOLVITE) 1 MG tablet Take by mouth.     furosemide (LASIX) 40 MG tablet Take by mouth as needed.      Ginger Oil OIL by Does not apply route daily.     lisinopril-hydrochlorothiazide (ZESTORETIC) 20-12.5 MG tablet Take 2 tablets by mouth daily.      Magnesium 100 MG CAPS Take by mouth.     MAGNESIUM OXIDE PO Take by mouth.     Nadide POWD Take one capsule by mouth twice daily.     Potassium 99 MG TABS Take by mouth.     Taurine 1000 MG CAPS Take by mouth.     traMADol (ULTRAM) 50 MG tablet Take 1 tablet (50 mg total) by mouth every 6 (six) hours as needed. 10 tablet 0   TURMERIC PO Take by mouth.     UNABLE TO FIND [employer]     No current facility-administered medications for this visit.    Past Medical History:  Diagnosis Date   Hypertension     No past surgical history on file.   Social History   Tobacco Use   Smoking status: Some  Days   Smokeless tobacco: Never  Substance Use Topics   Alcohol use: No   Drug use: No      No family history on file.   Allergies  Allergen Reactions   Amoxicillin    Amoxicillin-Pot Clavulanate    Antihistamines, Chlorpheniramine-Type Nausea Only   Diphenhydramine Hcl     Other reaction(s): Other (See Comments) drowsy    .REVIEW OF SYSTEMS (Negative unless checked)   Constitutional: [] Weight loss  [] Fever  [] Chills Cardiac: [] Chest pain   [] Chest pressure   [] Palpitations   [] Shortness of breath when laying flat   [] Shortness of breath at rest   [] Shortness of breath with exertion. Vascular:  [x] Pain in legs with walking   [x] Pain in legs at rest   [] Pain in legs when laying flat   [] Claudication   [] Pain in feet when walking  [] Pain in feet at rest  [] Pain in feet when laying flat   [] History of DVT   [] Phlebitis   [x] Swelling in legs   [] Varicose veins   [] Non-healing ulcers Pulmonary:   []   Uses home oxygen   [] Productive cough   [] Hemoptysis   [] Wheeze  [] COPD   [] Asthma Neurologic:  [] Dizziness  [] Blackouts   [] Seizures   [] History of stroke   [] History of TIA  [] Aphasia   [] Temporary blindness   [] Dysphagia   [] Weakness or numbness in arms   [] Weakness or numbness in legs Musculoskeletal:  [x] Arthritis   [] Joint swelling   [x] Joint pain   [] Low back pain Hematologic:  [] Easy bruising  [] Easy bleeding   [] Hypercoagulable state   [] Anemic   Gastrointestinal:  [] Blood in stool   [] Vomiting blood  [] Gastroesophageal reflux/heartburn   [] Abdominal pain Genitourinary:  [] Chronic kidney disease   [] Difficult urination  [] Frequent urination  [] Burning with urination   [] Hematuria Skin:  [x] Rashes   [] Ulcers   [] Wounds Psychological:  [] History of anxiety   []  History of major depression  Physical Examination  BP 139/81   Pulse 65   Ht 6' (1.829 m)   Wt 280 lb (127 kg)   BMI 37.97 kg/m  Gen:  WD/WN, NAD Head: [place]/AT, No temporalis wasting. Ear/Nose/Throat: Hearing grossly  intact, nares w/o erythema or drainage Eyes: Conjunctiva clear. Sclera non-icteric Neck: Supple.  Trachea midline Pulmonary:  Good air movement, no use of accessory muscles.  Cardiac: RRR, no JVD Vascular:  Vessel Right Left  Radial Palpable Palpable           Musculoskeletal: M/S 5/5 throughout.  No deformity or atrophy.  2-3+ bilateral lower extremity edema slightly worse on the left than the right. Neurologic: Sensation grossly intact in extremities.  Symmetrical.  Speech is fluent.  Psychiatric: Judgment intact, Mood & affect appropriate for pt's clinical situation. Dermatologic: No rashes or ulcers noted.  No cellulitis or open wounds.      Labs No results found for this or any previous visit (from the past 2160 hour(s)).  Radiology No results found.  Assessment/Plan Essential hypertension, benign blood pressure control important in reducing the progression of atherosclerotic disease. On appropriate oral medications.     Cellulitis of leg, right Several months ago, had a course of amoxil, improved  Swelling of limb Stable but not much improvement.  Remains significant  Lymphedema Severe and has been refractory to compression, elevation, and has been using the lymphedema pump again from a month.  Continue these measures.  Continue weekly Unna boots and a new 3 layer Unna boot was placed on both lower extremities today.     , MD  11/22/2020 11:01 AM    This note was created with Dragon medical transcription system.  Any errors from dictation are purely unintentional

## 2020-11-22 NOTE — Assessment & Plan Note (Signed)
Stable but not much improvement.  Remains significant

## 2020-11-22 NOTE — Assessment & Plan Note (Signed)
Severe and has been refractory to compression, elevation, and has been using the lymphedema pump again from a month.  Continue these measures.  Continue weekly Unna boots and a new 3 layer Unna boot was placed on both lower extremities today.

## 2020-11-29 ENCOUNTER — Ambulatory Visit (INDEPENDENT_AMBULATORY_CARE_PROVIDER_SITE_OTHER): Payer: Medicare Other | Admitting: Nurse Practitioner

## 2020-11-29 ENCOUNTER — Other Ambulatory Visit: Payer: Self-pay

## 2020-11-29 VITALS — BP 136/84 | HR 64 | Ht 72.0 in | Wt 278.0 lb

## 2020-11-29 DIAGNOSIS — L97212 Non-pressure chronic ulcer of right calf with fat layer exposed: Secondary | ICD-10-CM

## 2020-11-29 NOTE — Progress Notes (Signed)
History of Present Illness  There is no documented history at this time  Assessments & Plan   There are no diagnoses linked to this encounter.    Additional instructions  Subjective:  Patient presents with venous ulcer of the Bilateral lower extremity.    Procedure:  3 layer unna wrap was placed Bilateral lower extremity.   Plan:   Follow up in one week.  

## 2020-11-30 ENCOUNTER — Encounter (INDEPENDENT_AMBULATORY_CARE_PROVIDER_SITE_OTHER): Payer: Self-pay | Admitting: Nurse Practitioner

## 2020-12-06 ENCOUNTER — Other Ambulatory Visit: Payer: Self-pay

## 2020-12-06 ENCOUNTER — Ambulatory Visit (INDEPENDENT_AMBULATORY_CARE_PROVIDER_SITE_OTHER): Payer: Medicare Other | Admitting: Nurse Practitioner

## 2020-12-06 VITALS — BP 147/89 | HR 63 | Ht 72.0 in | Wt 278.0 lb

## 2020-12-06 DIAGNOSIS — L97212 Non-pressure chronic ulcer of right calf with fat layer exposed: Secondary | ICD-10-CM | POA: Diagnosis not present

## 2020-12-06 NOTE — Progress Notes (Signed)
History of Present Illness  There is no documented history at this time  Assessments & Plan   There are no diagnoses linked to this encounter.    Additional instructions  Subjective:  Patient presents with venous ulcer of the Bilateral lower extremity.    Procedure:  3 layer unna wrap was placed Bilateral lower extremity.   Plan:   Follow up in one week.  

## 2020-12-12 ENCOUNTER — Encounter (INDEPENDENT_AMBULATORY_CARE_PROVIDER_SITE_OTHER): Payer: Self-pay | Admitting: Nurse Practitioner

## 2020-12-13 ENCOUNTER — Other Ambulatory Visit: Payer: Self-pay

## 2020-12-13 ENCOUNTER — Ambulatory Visit (INDEPENDENT_AMBULATORY_CARE_PROVIDER_SITE_OTHER): Payer: Medicare Other | Admitting: Nurse Practitioner

## 2020-12-13 VITALS — BP 130/81 | HR 64 | Ht 72.0 in | Wt 282.0 lb

## 2020-12-13 DIAGNOSIS — L97212 Non-pressure chronic ulcer of right calf with fat layer exposed: Secondary | ICD-10-CM

## 2020-12-13 NOTE — Progress Notes (Signed)
History of Present Illness  There is no documented history at this time  Assessments & Plan   There are no diagnoses linked to this encounter.    Additional instructions  Subjective:  Patient presents with venous ulcer of the Bilateral lower extremity.    Procedure:  3 layer unna wrap was placed Bilateral lower extremity.   Plan:   Follow up in one week.  

## 2020-12-20 ENCOUNTER — Encounter (INDEPENDENT_AMBULATORY_CARE_PROVIDER_SITE_OTHER): Payer: Self-pay | Admitting: Nurse Practitioner

## 2020-12-20 ENCOUNTER — Encounter (INDEPENDENT_AMBULATORY_CARE_PROVIDER_SITE_OTHER): Payer: Self-pay | Admitting: Vascular Surgery

## 2020-12-20 ENCOUNTER — Other Ambulatory Visit: Payer: Self-pay

## 2020-12-20 ENCOUNTER — Ambulatory Visit (INDEPENDENT_AMBULATORY_CARE_PROVIDER_SITE_OTHER): Payer: Medicare Other | Admitting: Vascular Surgery

## 2020-12-20 VITALS — BP 150/85 | HR 61 | Ht 72.0 in | Wt 285.0 lb

## 2020-12-20 DIAGNOSIS — I10 Essential (primary) hypertension: Secondary | ICD-10-CM

## 2020-12-20 DIAGNOSIS — L03115 Cellulitis of right lower limb: Secondary | ICD-10-CM

## 2020-12-20 DIAGNOSIS — M7989 Other specified soft tissue disorders: Secondary | ICD-10-CM | POA: Diagnosis not present

## 2020-12-20 DIAGNOSIS — I890 Lymphedema, not elsewhere classified: Secondary | ICD-10-CM

## 2020-12-20 NOTE — Assessment & Plan Note (Signed)
About the same.  Recommend he use the lymphedema pump is much as possible.  Elevate.  3 layer Unna boots were placed again today and will be changed weekly.  Recheck in 4 weeks.

## 2020-12-20 NOTE — Progress Notes (Signed)
[name] : [id number]  Warren Leblanc is a 75 y.o. (Mar 16, 1945) male who presents with chief complaint of  Chief Complaint  Patient presents with   Follow-up    4 wk bil unna boot  .  History of Present Illness: Patient returns today in follow up of his lymphedema and leg swelling.  He has had several skin lesions, but over the past several weeks to months which are more bothersome.  They have the appearance of an actinic keratosis although I am certainly not expert in dermatology.  He has previously seen a dermatologist who clinic is no longer local and he is going to try to find a new dermatologist to evaluate these.  As for his legs, they are about the same.  He is using lymphedema pump but has not in the last few days as he was having more pain in his left knee.  The swelling has not significantly improved or worsened since his last visit with me 4 weeks ago.  We have been doing weekly Unna boots now for the better part of a year.  That has gotten his skin better and has kept the swelling as controlled as possible, but it is far from resolving the swelling.  Current Outpatient Medications  Medication Sig Dispense Refill   ACIDOPHILUS LACTOBACILLUS PO Take by mouth.     amoxicillin (AMOXIL) 500 MG capsule amoxicillin 500 mg capsule     Ascorbic Acid (VITAMIN C) 1000 MG tablet Take by mouth.     aspirin EC 81 MG tablet Take 81 mg by mouth daily.     Biotin 2.5 MG TABS Take by mouth.     Calcium Ascorbate 500 MG TABS Take by mouth.     Cyanocobalamin (VITAMIN B 12 PO) Take by mouth.     Ergocalciferol 2000 units TABS Take by mouth.     folic acid (FOLVITE) 1 MG tablet Take by mouth.     furosemide (LASIX) 40 MG tablet Take by mouth as needed.      Ginger Oil OIL by Does not apply route daily.     lisinopril-hydrochlorothiazide (ZESTORETIC) 20-12.5 MG tablet Take 2 tablets by mouth daily.      Magnesium 100 MG CAPS Take by mouth.     MAGNESIUM OXIDE PO Take by mouth.     Nadide POWD Take  one capsule by mouth twice daily.     Potassium 99 MG TABS Take by mouth.     Taurine 1000 MG CAPS Take by mouth.     traMADol (ULTRAM) 50 MG tablet Take 1 tablet (50 mg total) by mouth every 6 (six) hours as needed. 10 tablet 0   TURMERIC PO Take by mouth.     UNABLE TO FIND [employer]     No current facility-administered medications for this visit.    Past Medical History:  Diagnosis Date   Hypertension     No past surgical history on file.   Social History   Tobacco Use   Smoking status: Some Days   Smokeless tobacco: Never  Substance Use Topics   Alcohol use: No   Drug use: No      No family history on file.   Allergies  Allergen Reactions   Amoxicillin    Amoxicillin-Pot Clavulanate    Antihistamines, Chlorpheniramine-Type Nausea Only   Diphenhydramine Hcl     Other reaction(s): Other (See Comments) drowsy    REVIEW OF SYSTEMS (Negative unless checked)   Constitutional: [] Weight loss  [] Fever  []   Chills Cardiac: [] Chest pain   [] Chest pressure   [] Palpitations   [] Shortness of breath when laying flat   [] Shortness of breath at rest   [] Shortness of breath with exertion. Vascular:  [x] Pain in legs with walking   [x] Pain in legs at rest   [] Pain in legs when laying flat   [] Claudication   [] Pain in feet when walking  [] Pain in feet at rest  [] Pain in feet when laying flat   [] History of DVT   [] Phlebitis   [x] Swelling in legs   [] Varicose veins   [] Non-healing ulcers Pulmonary:   [] Uses home oxygen   [] Productive cough   [] Hemoptysis   [] Wheeze  [] COPD   [] Asthma Neurologic:  [] Dizziness  [] Blackouts   [] Seizures   [] History of stroke   [] History of TIA  [] Aphasia   [] Temporary blindness   [] Dysphagia   [] Weakness or numbness in arms   [] Weakness or numbness in legs Musculoskeletal:  [x] Arthritis   [] Joint swelling   [x] Joint pain   [] Low back pain Hematologic:  [] Easy bruising  [] Easy bleeding   [] Hypercoagulable state   [] Anemic   Gastrointestinal:  [] Blood in  stool   [] Vomiting blood  [] Gastroesophageal reflux/heartburn   [] Abdominal pain Genitourinary:  [] Chronic kidney disease   [] Difficult urination  [] Frequent urination  [] Burning with urination   [] Hematuria Skin:  [x] Rashes   [] Ulcers   [] Wounds Psychological:  [] History of anxiety   []  History of major depression    Physical Examination  BP (!) 150/85   Pulse 61   Ht 6' (1.829 m)   Wt 285 lb (129.3 kg)   BMI 38.65 kg/m  Gen:  WD/WN, NAD Head: [place]/AT, No temporalis wasting. Ear/Nose/Throat: Hearing grossly intact, nares w/o erythema or drainage Eyes: Conjunctiva clear. Sclera non-icteric Neck: Supple.  Trachea midline Pulmonary:  Good air movement, no use of accessory muscles.  Cardiac: RRR, no JVD Vascular:  Vessel Right Left  Radial Palpable Palpable           Musculoskeletal: M/S 5/5 throughout.  No deformity or atrophy.  3+ bilateral lower extremity edema. Neurologic: Sensation grossly intact in extremities.  Symmetrical.  Speech is fluent.  Psychiatric: Judgment intact, Mood & affect appropriate for pt's clinical situation. Dermatologic: No rashes or ulcers noted.  No cellulitis or open wounds.  He does have several relatively hard crusty lesions that have the appearance of actinic keratosis on his hands, head, and other areas.      Labs No results found for this or any previous visit (from the past 2160 hour(s)).  Radiology No results found.  Assessment/Plan Essential hypertension, benign blood pressure control important in reducing the progression of atherosclerotic disease. On appropriate oral medications.     Cellulitis of leg, right Several months ago, had a course of amoxil, improved   Swelling of limb Stable but not much improvement.  Remains significant  Lymphedema About the same.  Recommend he use the lymphedema pump is much as possible.  Elevate.  3 layer Unna boots were placed again today and will be changed weekly.  Recheck in 4  weeks.    , MD  12/20/2020 11:00 AM    This note was created with Dragon medical transcription system.  Any errors from dictation are purely unintentional

## 2020-12-27 ENCOUNTER — Encounter (INDEPENDENT_AMBULATORY_CARE_PROVIDER_SITE_OTHER): Payer: Self-pay

## 2020-12-27 ENCOUNTER — Other Ambulatory Visit: Payer: Self-pay

## 2020-12-27 ENCOUNTER — Ambulatory Visit (INDEPENDENT_AMBULATORY_CARE_PROVIDER_SITE_OTHER): Payer: Medicare Other | Admitting: Nurse Practitioner

## 2020-12-27 VITALS — BP 137/89 | HR 65 | Resp 16 | Wt 285.0 lb

## 2020-12-27 DIAGNOSIS — L97212 Non-pressure chronic ulcer of right calf with fat layer exposed: Secondary | ICD-10-CM | POA: Diagnosis not present

## 2020-12-27 NOTE — Progress Notes (Signed)
History of Present Illness  There is no documented history at this time  Assessments & Plan   There are no diagnoses linked to this encounter.    Additional instructions  Subjective:  Patient presents with venous ulcer of the Bilateral lower extremity.    Procedure:  3 layer unna wrap was placed Bilateral lower extremity.   Plan:   Follow up in one week.  

## 2021-01-03 ENCOUNTER — Other Ambulatory Visit: Payer: Self-pay

## 2021-01-03 ENCOUNTER — Ambulatory Visit (INDEPENDENT_AMBULATORY_CARE_PROVIDER_SITE_OTHER): Payer: Medicare Other | Admitting: Nurse Practitioner

## 2021-01-03 VITALS — BP 137/86 | HR 64 | Ht 72.0 in | Wt 282.0 lb

## 2021-01-03 DIAGNOSIS — L97212 Non-pressure chronic ulcer of right calf with fat layer exposed: Secondary | ICD-10-CM

## 2021-01-03 NOTE — Progress Notes (Signed)
History of Present Illness  There is no documented history at this time  Assessments & Plan   There are no diagnoses linked to this encounter.    Additional instructions  Subjective:  Bilateral Patient presents with venous ulcer of the  lower extremity.    Procedure:  3 layer unna wrap was placed Bilateral lower extremity.   Plan:   Follow up in one week.

## 2021-01-08 ENCOUNTER — Encounter (INDEPENDENT_AMBULATORY_CARE_PROVIDER_SITE_OTHER): Payer: Self-pay | Admitting: Nurse Practitioner

## 2021-01-10 ENCOUNTER — Ambulatory Visit (INDEPENDENT_AMBULATORY_CARE_PROVIDER_SITE_OTHER): Payer: Medicare Other | Admitting: Nurse Practitioner

## 2021-01-10 ENCOUNTER — Other Ambulatory Visit: Payer: Self-pay

## 2021-01-10 VITALS — BP 140/85 | HR 64 | Ht 72.0 in | Wt 284.0 lb

## 2021-01-10 DIAGNOSIS — L97212 Non-pressure chronic ulcer of right calf with fat layer exposed: Secondary | ICD-10-CM | POA: Diagnosis not present

## 2021-01-10 NOTE — Progress Notes (Signed)
History of Present Illness  There is no documented history at this time  Assessments & Plan   There are no diagnoses linked to this encounter.    Additional instructions  Subjective:  Patient presents with venous ulcer of the Bilateral lower extremity.    Procedure:  3 layer unna wrap was placed Bilateral lower extremity.   Plan:   Follow up in one week.  

## 2021-01-15 ENCOUNTER — Encounter (INDEPENDENT_AMBULATORY_CARE_PROVIDER_SITE_OTHER): Payer: Self-pay | Admitting: Nurse Practitioner

## 2021-01-17 ENCOUNTER — Other Ambulatory Visit: Payer: Self-pay

## 2021-01-17 ENCOUNTER — Ambulatory Visit (INDEPENDENT_AMBULATORY_CARE_PROVIDER_SITE_OTHER): Payer: Medicare Other | Admitting: Vascular Surgery

## 2021-01-17 ENCOUNTER — Encounter (INDEPENDENT_AMBULATORY_CARE_PROVIDER_SITE_OTHER): Payer: Self-pay | Admitting: Vascular Surgery

## 2021-01-17 VITALS — BP 137/86 | HR 66 | Resp 16 | Ht 72.0 in | Wt 285.0 lb

## 2021-01-17 DIAGNOSIS — M7989 Other specified soft tissue disorders: Secondary | ICD-10-CM

## 2021-01-17 DIAGNOSIS — L03115 Cellulitis of right lower limb: Secondary | ICD-10-CM

## 2021-01-17 DIAGNOSIS — I10 Essential (primary) hypertension: Secondary | ICD-10-CM

## 2021-01-17 DIAGNOSIS — I890 Lymphedema, not elsewhere classified: Secondary | ICD-10-CM | POA: Diagnosis not present

## 2021-01-17 NOTE — Progress Notes (Signed)
[name] : [id number]  Warren Leblanc is a 75 y.o. (07-25-1945) male who presents with chief complaint of  Chief Complaint  Patient presents with   Follow-up    Unna boot check  .  History of Present Illness: Patient returns today in follow up of his chronic severe leg swelling and lymphedema.  Slightly better than it was 4 weeks ago but he still remains significantly swollen.  He gets along well for is largely swollen as his legs are, but it is a frustration for him.  It certainly has exacerbated his hip and leg pain with walking.  No new ulcerations or infection.  No fevers or chills.  Current Outpatient Medications  Medication Sig Dispense Refill   ACIDOPHILUS LACTOBACILLUS PO Take by mouth.     amoxicillin (AMOXIL) 500 MG capsule amoxicillin 500 mg capsule     Ascorbic Acid (VITAMIN C) 1000 MG tablet Take by mouth.     aspirin EC 81 MG tablet Take 81 mg by mouth daily.     Biotin 2.5 MG TABS Take by mouth.     Calcium Ascorbate 500 MG TABS Take by mouth.     Cyanocobalamin (VITAMIN B 12 PO) Take by mouth.     Ergocalciferol 2000 units TABS Take by mouth.     folic acid (FOLVITE) 1 MG tablet Take by mouth.     furosemide (LASIX) 40 MG tablet Take by mouth as needed.      Ginger Oil OIL by Does not apply route daily.     lisinopril-hydrochlorothiazide (ZESTORETIC) 20-12.5 MG tablet Take 2 tablets by mouth daily.      Magnesium 100 MG CAPS Take by mouth.     MAGNESIUM OXIDE PO Take by mouth.     Nadide POWD Take one capsule by mouth twice daily.     Potassium 99 MG TABS Take by mouth.     Taurine 1000 MG CAPS Take by mouth.     traMADol (ULTRAM) 50 MG tablet Take 1 tablet (50 mg total) by mouth every 6 (six) hours as needed. 10 tablet 0   TURMERIC PO Take by mouth.     UNABLE TO FIND Carnivora     No current facility-administered medications for this visit.    Past Medical History:  Diagnosis Date   Hypertension     No past surgical history on file.   Social History    Tobacco Use   Smoking status: Some Days   Smokeless tobacco: Never  Substance Use Topics   Alcohol use: No   Drug use: No      No family history on file.   Allergies  Allergen Reactions   Amoxicillin    Amoxicillin-Pot Clavulanate    Antihistamines, Chlorpheniramine-Type Nausea Only   Diphenhydramine Hcl     Other reaction(s): Other (See Comments) drowsy    REVIEW OF SYSTEMS (Negative unless checked)   Constitutional: [] Weight loss  [] Fever  [] Chills Cardiac: [] Chest pain   [] Chest pressure   [] Palpitations   [] Shortness of breath when laying flat   [] Shortness of breath at rest   [] Shortness of breath with exertion. Vascular:  [x] Pain in legs with walking   [x] Pain in legs at rest   [] Pain in legs when laying flat   [] Claudication   [] Pain in feet when walking  [] Pain in feet at rest  [] Pain in feet when laying flat   [] History of DVT   [] Phlebitis   [x] Swelling in legs   [] Varicose veins   []   Non-healing ulcers Pulmonary:   [] Uses home oxygen   [] Productive cough   [] Hemoptysis   [] Wheeze  [] COPD   [] Asthma Neurologic:  [] Dizziness  [] Blackouts   [] Seizures   [] History of stroke   [] History of TIA  [] Aphasia   [] Temporary blindness   [] Dysphagia   [] Weakness or numbness in arms   [] Weakness or numbness in legs Musculoskeletal:  [x] Arthritis   [] Joint swelling   [x] Joint pain   [] Low back pain Hematologic:  [] Easy bruising  [] Easy bleeding   [] Hypercoagulable state   [] Anemic   Gastrointestinal:  [] Blood in stool   [] Vomiting blood  [] Gastroesophageal reflux/heartburn   [] Abdominal pain Genitourinary:  [] Chronic kidney disease   [] Difficult urination  [] Frequent urination  [] Burning with urination   [] Hematuria Skin:  [x] Rashes   [] Ulcers   [] Wounds Psychological:  [] History of anxiety   []  History of major depression  Physical Examination  BP 137/86 (BP Location: Right Arm)    Pulse 66    Resp 16    Ht 6' (1.829 m)    Wt 285 lb (129.3 kg)    BMI 38.65 kg/m  Gen:  WD/WN,  NAD Head: [place]/AT, No temporalis wasting. Ear/Nose/Throat: Hearing grossly intact, nares w/o erythema or drainage Eyes: Conjunctiva clear. Sclera non-icteric Neck: Supple.  Trachea midline Pulmonary:  Good air movement, no use of accessory muscles.  Cardiac: RRR, no JVD Vascular:  Vessel Right Left  Radial Palpable Palpable               Musculoskeletal: M/S 5/5 throughout.  No deformity or atrophy. 2-3+ RLE ischemia, 3+ LLE edema. Neurologic: Sensation grossly intact in extremities.  Symmetrical.  Speech is fluent.  Psychiatric: Judgment intact, Mood & affect appropriate for pt's clinical situation. Dermatologic: No rashes or ulcers noted.  No cellulitis or open wounds.      Labs No results found for this or any previous visit (from the past 2160 hour(s)).  Radiology No results found.  Assessment/Plan Essential hypertension, benign blood pressure control important in reducing the progression of atherosclerotic disease. On appropriate oral medications.     Cellulitis of leg, right Several months ago, had a course of amoxil, improved   Swelling of limb Stable, mild improvement.  Remains significant   Lymphedema About the same to slightly improved.  Recommend he use the lymphedema pump is much as possible.  Elevate.  3 layer Unna boots were placed again today and will be changed weekly.  Recheck in 4 weeks.    , MD  01/17/2021 2:39 PM    This note was created with Dragon medical transcription system.  Any errors from dictation are purely unintentional

## 2021-01-24 ENCOUNTER — Ambulatory Visit (INDEPENDENT_AMBULATORY_CARE_PROVIDER_SITE_OTHER): Payer: Medicare Other | Admitting: Nurse Practitioner

## 2021-01-24 ENCOUNTER — Encounter (INDEPENDENT_AMBULATORY_CARE_PROVIDER_SITE_OTHER): Payer: Self-pay

## 2021-01-24 ENCOUNTER — Other Ambulatory Visit: Payer: Self-pay

## 2021-01-24 VITALS — BP 144/85 | HR 65 | Resp 16 | Wt 284.0 lb

## 2021-01-24 DIAGNOSIS — L97212 Non-pressure chronic ulcer of right calf with fat layer exposed: Secondary | ICD-10-CM | POA: Diagnosis not present

## 2021-01-24 NOTE — Progress Notes (Signed)
History of Present Illness  There is no documented history at this time  Assessments & Plan   There are no diagnoses linked to this encounter.    Additional instructions  Subjective:  Patient presents with venous ulcer of the Bilateral lower extremity.    Procedure:  3 layer unna wrap was placed Bilateral lower extremity.   Plan:   Follow up in one week.  

## 2021-01-30 ENCOUNTER — Encounter (INDEPENDENT_AMBULATORY_CARE_PROVIDER_SITE_OTHER): Payer: Self-pay | Admitting: Nurse Practitioner

## 2021-01-31 ENCOUNTER — Other Ambulatory Visit: Payer: Self-pay

## 2021-01-31 ENCOUNTER — Encounter (INDEPENDENT_AMBULATORY_CARE_PROVIDER_SITE_OTHER): Payer: Self-pay

## 2021-01-31 ENCOUNTER — Ambulatory Visit (INDEPENDENT_AMBULATORY_CARE_PROVIDER_SITE_OTHER): Payer: Medicare Other | Admitting: Nurse Practitioner

## 2021-01-31 VITALS — BP 149/91 | HR 82 | Resp 16 | Wt 282.0 lb

## 2021-01-31 DIAGNOSIS — L97212 Non-pressure chronic ulcer of right calf with fat layer exposed: Secondary | ICD-10-CM | POA: Diagnosis not present

## 2021-01-31 NOTE — Progress Notes (Signed)
History of Present Illness  There is no documented history at this time  Assessments & Plan   There are no diagnoses linked to this encounter.    Additional instructions  Subjective:  Patient presents with venous ulcer of the Bilateral lower extremity.    Procedure:  3 layer unna wrap was placed Bilateral lower extremity.   Plan:   Follow up in one week.  

## 2021-02-07 ENCOUNTER — Ambulatory Visit (INDEPENDENT_AMBULATORY_CARE_PROVIDER_SITE_OTHER): Payer: Medicare Other | Admitting: Nurse Practitioner

## 2021-02-07 VITALS — BP 165/84 | HR 64 | Resp 16 | Ht 72.0 in | Wt 281.0 lb

## 2021-02-07 DIAGNOSIS — L97212 Non-pressure chronic ulcer of right calf with fat layer exposed: Secondary | ICD-10-CM

## 2021-02-07 NOTE — Progress Notes (Signed)
History of Present Illness  There is no documented history at this time  Assessments & Plan   There are no diagnoses linked to this encounter.    Additional instructions  Subjective:  Patient presents with venous ulcer of the Bilateral lower extremity.    Procedure:  3 layer unna wrap was placed Bilateral lower extremity.   Plan:   Follow up in one week.  

## 2021-02-12 ENCOUNTER — Encounter (INDEPENDENT_AMBULATORY_CARE_PROVIDER_SITE_OTHER): Payer: Self-pay | Admitting: Nurse Practitioner

## 2021-02-14 ENCOUNTER — Ambulatory Visit (INDEPENDENT_AMBULATORY_CARE_PROVIDER_SITE_OTHER): Payer: Medicare Other | Admitting: Vascular Surgery

## 2021-02-14 ENCOUNTER — Other Ambulatory Visit: Payer: Self-pay

## 2021-02-14 ENCOUNTER — Encounter (INDEPENDENT_AMBULATORY_CARE_PROVIDER_SITE_OTHER): Payer: Self-pay | Admitting: Vascular Surgery

## 2021-02-14 VITALS — BP 140/83 | HR 65 | Resp 16 | Ht 72.0 in | Wt 283.0 lb

## 2021-02-14 DIAGNOSIS — I10 Essential (primary) hypertension: Secondary | ICD-10-CM

## 2021-02-14 DIAGNOSIS — M7989 Other specified soft tissue disorders: Secondary | ICD-10-CM | POA: Diagnosis not present

## 2021-02-14 DIAGNOSIS — I890 Lymphedema, not elsewhere classified: Secondary | ICD-10-CM | POA: Diagnosis not present

## 2021-02-14 NOTE — Progress Notes (Signed)
[name] : [id number]  [name] is a 76 y.o. (1945/07/14) male who presents with chief complaint of  Chief Complaint  Patient presents with   Follow-up    4 week unna check  .  History of Present Illness: Patient returns today in follow up of his lymphedema and leg swelling.  The right leg is a little better today than it was at his last visit 4 weeks ago.  The left leg is about the same.  He is in good spirits today.  He has been doing Engineer, mining for many months now.  Current Outpatient Medications  Medication Sig Dispense Refill   ACIDOPHILUS LACTOBACILLUS PO Take by mouth.     amoxicillin (AMOXIL) 500 MG capsule amoxicillin 500 mg capsule     Ascorbic Acid (VITAMIN C) 1000 MG tablet Take by mouth.     Biotin 2.5 MG TABS Take by mouth.     Calcium Ascorbate 500 MG TABS Take by mouth.     Cyanocobalamin (VITAMIN B 12 PO) Take by mouth.     Ergocalciferol 2000 units TABS Take by mouth.     folic acid (FOLVITE) 1 MG tablet Take by mouth.     Ginger Oil OIL by Does not apply route daily.     lisinopril-hydrochlorothiazide (ZESTORETIC) 20-12.5 MG tablet Take 2 tablets by mouth daily.      Magnesium 100 MG CAPS Take by mouth.     MAGNESIUM OXIDE PO Take by mouth.     Nadide POWD Take one capsule by mouth twice daily.     Potassium 99 MG TABS Take by mouth.     Taurine 1000 MG CAPS Take by mouth.     TURMERIC PO Take by mouth.     UNABLE TO FIND Carnivora     aspirin EC 81 MG tablet Take 81 mg by mouth daily. (Patient not taking: Reported on 02/14/2021)     furosemide (LASIX) 40 MG tablet Take by mouth as needed.  (Patient not taking: Reported on 02/14/2021)     traMADol (ULTRAM) 50 MG tablet Take 1 tablet (50 mg total) by mouth every 6 (six) hours as needed. (Patient not taking: Reported on 02/14/2021) 10 tablet 0   No current facility-administered medications for this visit.    Past Medical History:  Diagnosis Date   Hypertension     No past surgical history on  file.   Social History   Tobacco Use   Smoking status: Some Days   Smokeless tobacco: Never  Substance Use Topics   Alcohol use: No   Drug use: No      No family history on file.   Allergies  Allergen Reactions   Amoxicillin    Amoxicillin-Pot Clavulanate    Antihistamines, Chlorpheniramine-Type Nausea Only   Diphenhydramine Hcl     Other reaction(s): Other (See Comments) drowsy      REVIEW OF SYSTEMS (Negative unless checked)   Constitutional: [] Weight loss  [] Fever  [] Chills Cardiac: [] Chest pain   [] Chest pressure   [] Palpitations   [] Shortness of breath when laying flat   [] Shortness of breath at rest   [] Shortness of breath with exertion. Vascular:  [x] Pain in legs with walking   [x] Pain in legs at rest   [] Pain in legs when laying flat   [] Claudication   [] Pain in feet when walking  [] Pain in feet at rest  [] Pain in feet when laying flat   [] History of DVT   [] Phlebitis   [x] Swelling  in legs   [] Varicose veins   [] Non-healing ulcers Pulmonary:   [] Uses home oxygen   [] Productive cough   [] Hemoptysis   [] Wheeze  [] COPD   [] Asthma Neurologic:  [] Dizziness  [] Blackouts   [] Seizures   [] History of stroke   [] History of TIA  [] Aphasia   [] Temporary blindness   [] Dysphagia   [] Weakness or numbness in arms   [] Weakness or numbness in legs Musculoskeletal:  [x] Arthritis   [] Joint swelling   [x] Joint pain   [] Low back pain Hematologic:  [] Easy bruising  [] Easy bleeding   [] Hypercoagulable state   [] Anemic   Gastrointestinal:  [] Blood in stool   [] Vomiting blood  [] Gastroesophageal reflux/heartburn   [] Abdominal pain Genitourinary:  [] Chronic kidney disease   [] Difficult urination  [] Frequent urination  [] Burning with urination   [] Hematuria Skin:  [x] Rashes   [] Ulcers   [] Wounds Psychological:  [] History of anxiety   []  History of major depression  Physical Examination  BP 140/83 (BP Location: Right Arm)    Pulse 65    Resp 16    Ht 6' (1.829 m)    Wt 283 lb (128.4 kg)     BMI 38.38 kg/m  Gen:  WD/WN, NAD Head: [place]/AT, No temporalis wasting. Ear/Nose/Throat: Hearing grossly intact, nares w/o erythema or drainage Eyes: Conjunctiva clear. Sclera non-icteric Neck: Supple.  Trachea midline Pulmonary:  Good air movement, no use of accessory muscles.  Cardiac: RRR, no JVD Vascular:  Vessel Right Left  Radial Palpable Palpable           Musculoskeletal: M/S 5/5 throughout.  No deformity or atrophy. 2+ RLE edema, 3+ LLE edema. Neurologic: Sensation grossly intact in extremities.  Symmetrical.  Speech is fluent.  Psychiatric: Judgment intact, Mood & affect appropriate for pt's clinical situation. Dermatologic: No rashes or ulcers noted.  No cellulitis or open wounds.      Labs No results found for this or any previous visit (from the past 2160 hour(s)).  Radiology No results found.  Assessment/Plan Essential hypertension, benign blood pressure control important in reducing the progression of atherosclerotic disease. On appropriate oral medications.     Cellulitis of leg, right Several months ago, had a course of amoxil, improved   Swelling of limb Stable, mild improvement.  Remains significant   Lymphedema About the same on the left, slightly improved on the right.  Recommend he use the lymphedema pump is much as possible.  Elevate.  3 layer Unna boots were placed again today and will be changed weekly.  Recheck in 4 weeks.    Leotis Pain, MD  02/14/2021 11:47 AM    This note was created with Dragon medical transcription system.  Any errors from dictation are purely unintentional

## 2021-02-21 ENCOUNTER — Ambulatory Visit (INDEPENDENT_AMBULATORY_CARE_PROVIDER_SITE_OTHER): Payer: Medicare Other | Admitting: Nurse Practitioner

## 2021-02-21 ENCOUNTER — Other Ambulatory Visit: Payer: Self-pay

## 2021-02-21 ENCOUNTER — Encounter (INDEPENDENT_AMBULATORY_CARE_PROVIDER_SITE_OTHER): Payer: Self-pay | Admitting: Nurse Practitioner

## 2021-02-21 VITALS — BP 137/86 | HR 64 | Resp 16 | Wt 284.4 lb

## 2021-02-21 DIAGNOSIS — L97212 Non-pressure chronic ulcer of right calf with fat layer exposed: Secondary | ICD-10-CM | POA: Diagnosis not present

## 2021-02-21 NOTE — Progress Notes (Signed)
History of Present Illness  There is no documented history at this time  Assessments & Plan   There are no diagnoses linked to this encounter.    Additional instructions  Subjective:  Patient presents with venous ulcer of the Bilateral lower extremity.    Procedure:  3 layer unna wrap was placed Bilateral lower extremity.   Plan:   Follow up in one week.  

## 2021-02-26 ENCOUNTER — Encounter (INDEPENDENT_AMBULATORY_CARE_PROVIDER_SITE_OTHER): Payer: Self-pay | Admitting: Nurse Practitioner

## 2021-02-28 ENCOUNTER — Encounter (INDEPENDENT_AMBULATORY_CARE_PROVIDER_SITE_OTHER): Payer: Self-pay | Admitting: Nurse Practitioner

## 2021-02-28 ENCOUNTER — Other Ambulatory Visit: Payer: Self-pay

## 2021-02-28 ENCOUNTER — Ambulatory Visit (INDEPENDENT_AMBULATORY_CARE_PROVIDER_SITE_OTHER): Payer: Medicare Other | Admitting: Nurse Practitioner

## 2021-02-28 VITALS — BP 157/83 | HR 70 | Resp 16 | Wt 286.0 lb

## 2021-02-28 DIAGNOSIS — L97212 Non-pressure chronic ulcer of right calf with fat layer exposed: Secondary | ICD-10-CM

## 2021-02-28 NOTE — Progress Notes (Signed)
History of Present Illness  There is no documented history at this time  Assessments & Plan   There are no diagnoses linked to this encounter.    Additional instructions  Subjective:  Patient presents with venous ulcer of the Bilateral lower extremity.    Procedure:  3 layer unna wrap was placed Bilateral lower extremity.   Plan:   Follow up in one week.  

## 2021-03-07 ENCOUNTER — Encounter (INDEPENDENT_AMBULATORY_CARE_PROVIDER_SITE_OTHER): Payer: Self-pay

## 2021-03-07 ENCOUNTER — Other Ambulatory Visit: Payer: Self-pay

## 2021-03-07 ENCOUNTER — Ambulatory Visit (INDEPENDENT_AMBULATORY_CARE_PROVIDER_SITE_OTHER): Payer: Medicare Other | Admitting: Nurse Practitioner

## 2021-03-07 VITALS — BP 147/79 | HR 71 | Resp 16 | Wt 285.0 lb

## 2021-03-07 DIAGNOSIS — L97212 Non-pressure chronic ulcer of right calf with fat layer exposed: Secondary | ICD-10-CM | POA: Diagnosis not present

## 2021-03-07 NOTE — Progress Notes (Signed)
History of Present Illness  There is no documented history at this time  Assessments & Plan   There are no diagnoses linked to this encounter.    Additional instructions  Subjective:  Patient presents with venous ulcer of the Bilateral lower extremity.    Procedure:  3 layer unna wrap was placed Bilateral lower extremity.   Plan:   Follow up in one week.  

## 2021-03-12 ENCOUNTER — Encounter (INDEPENDENT_AMBULATORY_CARE_PROVIDER_SITE_OTHER): Payer: Self-pay | Admitting: Nurse Practitioner

## 2021-03-14 ENCOUNTER — Encounter (INDEPENDENT_AMBULATORY_CARE_PROVIDER_SITE_OTHER): Payer: Medicare Other

## 2021-03-14 ENCOUNTER — Ambulatory Visit (INDEPENDENT_AMBULATORY_CARE_PROVIDER_SITE_OTHER): Payer: Medicare Other | Admitting: Vascular Surgery

## 2021-03-14 ENCOUNTER — Encounter (INDEPENDENT_AMBULATORY_CARE_PROVIDER_SITE_OTHER): Payer: Self-pay | Admitting: Vascular Surgery

## 2021-03-14 ENCOUNTER — Other Ambulatory Visit: Payer: Self-pay

## 2021-03-14 VITALS — BP 127/85 | HR 57 | Resp 16 | Wt 282.0 lb

## 2021-03-14 DIAGNOSIS — L03115 Cellulitis of right lower limb: Secondary | ICD-10-CM

## 2021-03-14 DIAGNOSIS — I10 Essential (primary) hypertension: Secondary | ICD-10-CM

## 2021-03-14 DIAGNOSIS — I890 Lymphedema, not elsewhere classified: Secondary | ICD-10-CM

## 2021-03-14 DIAGNOSIS — M7989 Other specified soft tissue disorders: Secondary | ICD-10-CM | POA: Diagnosis not present

## 2021-03-14 NOTE — Assessment & Plan Note (Signed)
He has lost 6 pounds and his legs actually look better than they have in some time.  We will continue the Unna boot wraps weekly and I will see him back in 4 weeks.

## 2021-03-14 NOTE — Progress Notes (Signed)
[name] : [id number]  Warren Leblanc is a 76 y.o. (09/12/1945) male who presents with chief complaint of  Chief Complaint  Patient presents with   Follow-up    4 week unna check  .  History of Present Illness: Patient returns today in follow up of his lymphedema and leg swelling.  We have been doing weekly Unna boots now for months.  Since his last visit, his legs are noticeably better.  Particularly on the right side.  No fevers or chills.  No wounds.  No drainage from the legs.  Current Outpatient Medications  Medication Sig Dispense Refill   ACIDOPHILUS LACTOBACILLUS PO Take by mouth.     amoxicillin (AMOXIL) 500 MG capsule amoxicillin 500 mg capsule     Ascorbic Acid (VITAMIN C) 1000 MG tablet Take by mouth.     Biotin 2.5 MG TABS Take by mouth.     Calcium Ascorbate 500 MG TABS Take by mouth.     Cyanocobalamin (VITAMIN B 12 PO) Take by mouth.     Ergocalciferol 2000 units TABS Take by mouth.     folic acid (FOLVITE) 1 MG tablet Take by mouth.     Ginger Oil OIL by Does not apply route daily.     lisinopril-hydrochlorothiazide (ZESTORETIC) 20-12.5 MG tablet Take 2 tablets by mouth daily.      Magnesium 100 MG CAPS Take by mouth.     MAGNESIUM OXIDE PO Take by mouth.     Nadide POWD Take one capsule by mouth twice daily.     Potassium 99 MG TABS Take by mouth.     Taurine 1000 MG CAPS Take by mouth.     TURMERIC PO Take by mouth.     UNABLE TO FIND Carnivora     aspirin EC 81 MG tablet Take 81 mg by mouth daily. (Patient not taking: Reported on 02/14/2021)     furosemide (LASIX) 40 MG tablet Take by mouth as needed.  (Patient not taking: Reported on 02/14/2021)     traMADol (ULTRAM) 50 MG tablet Take 1 tablet (50 mg total) by mouth every 6 (six) hours as needed. (Patient not taking: Reported on 02/14/2021) 10 tablet 0   No current facility-administered medications for this visit.    Past Medical History:  Diagnosis Date   Hypertension     No past surgical history on  file.   Social History   Tobacco Use   Smoking status: Some Days   Smokeless tobacco: Never  Substance Use Topics   Alcohol use: No   Drug use: No      No family history on file.   Allergies  Allergen Reactions   Amoxicillin    Amoxicillin-Pot Clavulanate    Antihistamines, Chlorpheniramine-Type Nausea Only   Diphenhydramine Hcl     Other reaction(s): Other (See Comments) drowsy    REVIEW OF SYSTEMS (Negative unless checked)   Constitutional: [] Weight loss  [] Fever  [] Chills Cardiac: [] Chest pain   [] Chest pressure   [] Palpitations   [] Shortness of breath when laying flat   [] Shortness of breath at rest   [] Shortness of breath with exertion. Vascular:  [x] Pain in legs with walking   [x] Pain in legs at rest   [] Pain in legs when laying flat   [] Claudication   [] Pain in feet when walking  [] Pain in feet at rest  [] Pain in feet when laying flat   [] History of DVT   [] Phlebitis   [x] Swelling in legs   [] Varicose veins   []   Non-healing ulcers Pulmonary:   [] Uses home oxygen   [] Productive cough   [] Hemoptysis   [] Wheeze  [] COPD   [] Asthma Neurologic:  [] Dizziness  [] Blackouts   [] Seizures   [] History of stroke   [] History of TIA  [] Aphasia   [] Temporary blindness   [] Dysphagia   [] Weakness or numbness in arms   [] Weakness or numbness in legs Musculoskeletal:  [x] Arthritis   [] Joint swelling   [x] Joint pain   [] Low back pain Hematologic:  [] Easy bruising  [] Easy bleeding   [] Hypercoagulable state   [] Anemic   Gastrointestinal:  [] Blood in stool   [] Vomiting blood  [] Gastroesophageal reflux/heartburn   [] Abdominal pain Genitourinary:  [] Chronic kidney disease   [] Difficult urination  [] Frequent urination  [] Burning with urination   [] Hematuria Skin:  [x] Rashes   [] Ulcers   [] Wounds Psychological:  [] History of anxiety   []  History of major depression  Physical Examination  BP 127/85 (BP Location: Right Arm)    Pulse (!) 57    Resp 16    Wt 282 lb (127.9 kg)    BMI 38.25 kg/m   Gen:  WD/WN, NAD Head: [place]/AT, No temporalis wasting. Ear/Nose/Throat: Hearing grossly intact, nares w/o erythema or drainage Eyes: Conjunctiva clear. Sclera non-icteric Neck: Supple.  Trachea midline Pulmonary:  Good air movement, no use of accessory muscles.  Cardiac: RRR, no JVD Vascular:  Vessel Right Left  Radial Palpable Palpable           Musculoskeletal: M/S 5/5 throughout.  No deformity or atrophy.  2+ right lower extremity edema, 2-3+ left lower extremity edema. Neurologic: Sensation grossly intact in extremities.  Symmetrical.  Speech is fluent.  Psychiatric: Judgment intact, Mood & affect appropriate for pt's clinical situation. Dermatologic: No rashes or ulcers noted.  No cellulitis or open wounds.      Labs No results found for this or any previous visit (from the past 2160 hour(s)).  Radiology No results found.  Assessment/Plan Essential hypertension, benign blood pressure control important in reducing the progression of atherosclerotic disease. On appropriate oral medications.     Cellulitis of leg, right Several months ago, had a course of amoxil, improved   Swelling of limb Stable, mild improvement.  Remains significant  Lymphedema He has lost 6 pounds and his legs actually look better than they have in some time.  We will continue the Unna boot wraps weekly and I will see him back in 4 weeks.    Leotis Pain, MD  03/14/2021 12:26 PM    This note was created with Dragon medical transcription system.  Any errors from dictation are purely unintentional

## 2021-03-21 ENCOUNTER — Other Ambulatory Visit: Payer: Self-pay

## 2021-03-21 ENCOUNTER — Encounter (INDEPENDENT_AMBULATORY_CARE_PROVIDER_SITE_OTHER): Payer: Self-pay | Admitting: Nurse Practitioner

## 2021-03-21 ENCOUNTER — Ambulatory Visit (INDEPENDENT_AMBULATORY_CARE_PROVIDER_SITE_OTHER): Payer: Medicare Other | Admitting: Nurse Practitioner

## 2021-03-21 VITALS — BP 135/84 | HR 69 | Resp 16 | Wt 283.0 lb

## 2021-03-21 DIAGNOSIS — L97212 Non-pressure chronic ulcer of right calf with fat layer exposed: Secondary | ICD-10-CM

## 2021-03-21 NOTE — Progress Notes (Signed)
History of Present Illness  There is no documented history at this time  Assessments & Plan   There are no diagnoses linked to this encounter.    Additional instructions  Subjective:  Patient presents with venous ulcer of the Bilateral lower extremity.    Procedure:  3 layer unna wrap was placed Bilateral lower extremity.   Plan:   Follow up in one week.  

## 2021-03-26 ENCOUNTER — Encounter (INDEPENDENT_AMBULATORY_CARE_PROVIDER_SITE_OTHER): Payer: Self-pay | Admitting: Nurse Practitioner

## 2021-03-28 ENCOUNTER — Other Ambulatory Visit: Payer: Self-pay

## 2021-03-28 ENCOUNTER — Encounter (INDEPENDENT_AMBULATORY_CARE_PROVIDER_SITE_OTHER): Payer: Self-pay

## 2021-03-28 ENCOUNTER — Ambulatory Visit (INDEPENDENT_AMBULATORY_CARE_PROVIDER_SITE_OTHER): Payer: Medicare Other | Admitting: Nurse Practitioner

## 2021-03-28 VITALS — BP 134/85 | HR 60 | Resp 16 | Wt 285.0 lb

## 2021-03-28 DIAGNOSIS — I890 Lymphedema, not elsewhere classified: Secondary | ICD-10-CM | POA: Diagnosis not present

## 2021-03-28 NOTE — Progress Notes (Signed)
History of Present Illness  There is no documented history at this time  Assessments & Plan   There are no diagnoses linked to this encounter.    Additional instructions  Subjective:  Patient presents with venous ulcer of the Bilateral lower extremity.    Procedure:  3 layer unna wrap was placed Bilateral lower extremity.   Plan:   Follow up in one week.  

## 2021-04-01 ENCOUNTER — Encounter (INDEPENDENT_AMBULATORY_CARE_PROVIDER_SITE_OTHER): Payer: Self-pay | Admitting: Nurse Practitioner

## 2021-04-04 ENCOUNTER — Other Ambulatory Visit: Payer: Self-pay

## 2021-04-04 ENCOUNTER — Ambulatory Visit (INDEPENDENT_AMBULATORY_CARE_PROVIDER_SITE_OTHER): Payer: Medicare Other | Admitting: Nurse Practitioner

## 2021-04-04 ENCOUNTER — Ambulatory Visit (INDEPENDENT_AMBULATORY_CARE_PROVIDER_SITE_OTHER): Payer: Medicare Other | Admitting: Vascular Surgery

## 2021-04-04 ENCOUNTER — Encounter (INDEPENDENT_AMBULATORY_CARE_PROVIDER_SITE_OTHER): Payer: Self-pay

## 2021-04-04 VITALS — BP 138/81 | HR 68 | Resp 16 | Wt 286.0 lb

## 2021-04-04 DIAGNOSIS — L97212 Non-pressure chronic ulcer of right calf with fat layer exposed: Secondary | ICD-10-CM

## 2021-04-04 NOTE — Progress Notes (Signed)
History of Present Illness  There is no documented history at this time  Assessments & Plan   There are no diagnoses linked to this encounter.    Additional instructions  Subjective:  Patient presents with venous ulcer of the Bilateral lower extremity.    Procedure:  3 layer unna wrap was placed Bilateral lower extremity.   Plan:   Follow up in one week.  

## 2021-04-10 ENCOUNTER — Encounter (INDEPENDENT_AMBULATORY_CARE_PROVIDER_SITE_OTHER): Payer: Self-pay | Admitting: Nurse Practitioner

## 2021-04-11 ENCOUNTER — Encounter (INDEPENDENT_AMBULATORY_CARE_PROVIDER_SITE_OTHER): Payer: Medicare Other

## 2021-04-11 ENCOUNTER — Other Ambulatory Visit: Payer: Self-pay

## 2021-04-11 ENCOUNTER — Ambulatory Visit (INDEPENDENT_AMBULATORY_CARE_PROVIDER_SITE_OTHER): Payer: Medicare Other | Admitting: Vascular Surgery

## 2021-04-11 ENCOUNTER — Ambulatory Visit (INDEPENDENT_AMBULATORY_CARE_PROVIDER_SITE_OTHER): Payer: Medicare Other | Admitting: Nurse Practitioner

## 2021-04-11 ENCOUNTER — Encounter (INDEPENDENT_AMBULATORY_CARE_PROVIDER_SITE_OTHER): Payer: Self-pay | Admitting: Nurse Practitioner

## 2021-04-11 VITALS — BP 133/87 | HR 65 | Resp 16 | Wt 283.0 lb

## 2021-04-11 DIAGNOSIS — I890 Lymphedema, not elsewhere classified: Secondary | ICD-10-CM

## 2021-04-11 NOTE — Progress Notes (Signed)
History of Present Illness  There is no documented history at this time  Assessments & Plan   There are no diagnoses linked to this encounter.    Additional instructions  Subjective:  Patient presents with venous ulcer of the Bilateral lower extremity.    Procedure:  3 layer unna wrap was placed Bilateral lower extremity.   Plan:   Follow up in one week.  

## 2021-04-15 ENCOUNTER — Encounter (INDEPENDENT_AMBULATORY_CARE_PROVIDER_SITE_OTHER): Payer: Self-pay | Admitting: Nurse Practitioner

## 2021-04-21 ENCOUNTER — Ambulatory Visit (INDEPENDENT_AMBULATORY_CARE_PROVIDER_SITE_OTHER): Payer: Medicare Other

## 2021-04-25 ENCOUNTER — Ambulatory Visit (INDEPENDENT_AMBULATORY_CARE_PROVIDER_SITE_OTHER): Payer: Medicare Other | Admitting: Vascular Surgery

## 2021-04-25 ENCOUNTER — Encounter (INDEPENDENT_AMBULATORY_CARE_PROVIDER_SITE_OTHER): Payer: Self-pay | Admitting: Vascular Surgery

## 2021-04-25 ENCOUNTER — Other Ambulatory Visit: Payer: Self-pay

## 2021-04-25 VITALS — BP 133/85 | HR 71 | Resp 17 | Wt 283.0 lb

## 2021-04-25 DIAGNOSIS — I890 Lymphedema, not elsewhere classified: Secondary | ICD-10-CM

## 2021-04-25 DIAGNOSIS — L03115 Cellulitis of right lower limb: Secondary | ICD-10-CM | POA: Diagnosis not present

## 2021-04-25 DIAGNOSIS — M7989 Other specified soft tissue disorders: Secondary | ICD-10-CM | POA: Diagnosis not present

## 2021-04-25 DIAGNOSIS — I10 Essential (primary) hypertension: Secondary | ICD-10-CM | POA: Diagnosis not present

## 2021-04-25 NOTE — Assessment & Plan Note (Signed)
Stable.  3 layer Unna boot is placed bilaterally again today and will be changed weekly.  Recheck in 4 to 6 weeks. ?

## 2021-04-25 NOTE — Progress Notes (Signed)
? ? ?[name] : [id number] ? ?Warren Leblanc is a 76 y.o. ([birth year]) male who presents with chief complaint of  ?Chief Complaint  ?Patient presents with  ? Follow-up  ?. ? ?History of Present Illness: Patient returns today in follow up of his leg swelling and lymphedema.  His legs are doing a little bit better with the weekly Unna boots.  He is in good spirits and we had a good conversation about hockey today. ? ?Current Outpatient Medications  ?Medication Sig Dispense Refill  ? ACIDOPHILUS LACTOBACILLUS PO Take by mouth.    ? Ascorbic Acid (VITAMIN C) 1000 MG tablet Take by mouth.    ? Biotin 2.5 MG TABS Take by mouth.    ? Calcium Ascorbate 500 MG TABS Take by mouth.    ? Cyanocobalamin (VITAMIN B 12 PO) Take by mouth.    ? Ergocalciferol 2000 units TABS Take by mouth.    ? folic acid (FOLVITE) 1 MG tablet Take by mouth.    ? Ginger Oil OIL by Does not apply route daily.    ? lisinopril-hydrochlorothiazide (ZESTORETIC) 20-12.5 MG tablet Take 2 tablets by mouth daily.     ? Magnesium 100 MG CAPS Take by mouth.    ? MAGNESIUM OXIDE PO Take by mouth.    ? Nadide POWD Take one capsule by mouth twice daily.    ? Potassium 99 MG TABS Take by mouth.    ? Taurine 1000 MG CAPS Take by mouth.    ? traMADol (ULTRAM) 50 MG tablet Take 1 tablet (50 mg total) by mouth every 6 (six) hours as needed. 10 tablet 0  ? TURMERIC PO Take by mouth.    ? UNABLE TO FIND Carnivora    ? amoxicillin (AMOXIL) 500 MG capsule amoxicillin 500 mg capsule (Patient not taking: Reported on 04/25/2021)    ? aspirin EC 81 MG tablet Take 81 mg by mouth daily. (Patient not taking: Reported on 02/14/2021)    ? furosemide (LASIX) 40 MG tablet Take by mouth as needed.  (Patient not taking: Reported on 02/14/2021)    ? ?No current facility-administered medications for this visit.  ? ? ?Past Medical History:  ?Diagnosis Date  ? Hypertension   ? ? ?No past surgical history on file. ? ? ?Social History  ? ?Tobacco Use  ? Smoking status: Some Days  ? Smokeless  tobacco: Never  ?Substance Use Topics  ? Alcohol use: No  ? Drug use: No  ? ? ? ? ?No family history on file. ? ? ?Allergies  ?Allergen Reactions  ? Amoxicillin   ? Amoxicillin-Pot Clavulanate   ? Antihistamines, Chlorpheniramine-Type Nausea Only  ? Diphenhydramine Hcl   ?  Other reaction(s): Other (See Comments) ?drowsy  ? ? ?REVIEW OF SYSTEMS (Negative unless checked) ?  ?Constitutional: [] Weight loss  [] Fever  [] Chills ?Cardiac: [] Chest pain   [] Chest pressure   [] Palpitations   [] Shortness of breath when laying flat   [] Shortness of breath at rest   [] Shortness of breath with exertion. ?Vascular:  [x] Pain in legs with walking   [x] Pain in legs at rest   [] Pain in legs when laying flat   [] Claudication   [] Pain in feet when walking  [] Pain in feet at rest  [] Pain in feet when laying flat   [] History of DVT   [] Phlebitis   [x] Swelling in legs   [] Varicose veins   [] Non-healing ulcers ?Pulmonary:   [] Uses home oxygen   [] Productive cough   [] Hemoptysis   [] Wheeze  []   COPD   [] Asthma ?Neurologic:  [] Dizziness  [] Blackouts   [] Seizures   [] History of stroke   [] History of TIA  [] Aphasia   [] Temporary blindness   [] Dysphagia   [] Weakness or numbness in arms   [] Weakness or numbness in legs ?Musculoskeletal:  [x] Arthritis   [] Joint swelling   [x] Joint pain   [] Low back pain ?Hematologic:  [] Easy bruising  [] Easy bleeding   [] Hypercoagulable state   [] Anemic   ?Gastrointestinal:  [] Blood in stool   [] Vomiting blood  [] Gastroesophageal reflux/heartburn   [] Abdominal pain ?Genitourinary:  [] Chronic kidney disease   [] Difficult urination  [] Frequent urination  [] Burning with urination   [] Hematuria ?Skin:  [x] Rashes   [] Ulcers   [] Wounds ?Psychological:  [] History of anxiety   []  History of major depression ? ? ?Physical Examination ? ?BP 133/85 (BP Location: Right Arm)   Pulse 71   Resp 17   Wt 283 lb (128.4 kg)   BMI 38.38 kg/m?  ?Gen:  WD/WN, NAD ?Head: [place]/AT, No temporalis wasting. ?Ear/Nose/Throat: Hearing grossly  intact, nares w/o erythema or drainage ?Eyes: Conjunctiva clear. Sclera non-icteric ?Neck: Supple.  Trachea midline ?Pulmonary:  Good air movement, no use of accessory muscles.  ?Cardiac: RRR, no JVD ?Vascular:  ?Vessel Right Left  ?Radial Palpable Palpable  ?    ?    ? ?Musculoskeletal: M/S 5/5 throughout.  No deformity or atrophy.  2+ bilateral lower extremity edema. ?Neurologic: Sensation grossly intact in extremities.  Symmetrical.  Speech is fluent.  ?Psychiatric: Judgment intact, Mood & affect appropriate for pt's clinical situation. ?Dermatologic: No rashes or ulcers noted.  No cellulitis or open wounds. ? ? ? ? ? ?Labs ?No results found for this or any previous visit (from the past 2160 hour(s)). ? ?Radiology ?No results found. ? ?Assessment/Plan ?Essential hypertension, benign ?blood pressure control important in reducing the progression of atherosclerotic disease. On appropriate oral medications. ?  ?  ?Cellulitis of leg, right ?Several months ago, had a course of amoxil, improved ?  ?Swelling of limb ?Stable, mild improvement.  Remains significant ? ?Lymphedema ?Stable.  3 layer Unna boot is placed bilaterally again today and will be changed weekly.  Recheck in 4 to 6 weeks. ? ? ? ? , MD ? ?04/25/2021 ?3:06 PM ? ? ? ?This note was created with Dragon medical transcription system.  Any errors from dictation are purely unintentional  ?

## 2021-05-02 ENCOUNTER — Encounter (INDEPENDENT_AMBULATORY_CARE_PROVIDER_SITE_OTHER): Payer: Medicare Other

## 2021-05-02 ENCOUNTER — Encounter (INDEPENDENT_AMBULATORY_CARE_PROVIDER_SITE_OTHER): Payer: Self-pay

## 2021-05-02 ENCOUNTER — Other Ambulatory Visit: Payer: Self-pay

## 2021-05-02 ENCOUNTER — Ambulatory Visit (INDEPENDENT_AMBULATORY_CARE_PROVIDER_SITE_OTHER): Payer: Medicare Other | Admitting: Nurse Practitioner

## 2021-05-02 VITALS — BP 124/85 | HR 73 | Wt 285.0 lb

## 2021-05-02 DIAGNOSIS — L97212 Non-pressure chronic ulcer of right calf with fat layer exposed: Secondary | ICD-10-CM | POA: Diagnosis not present

## 2021-05-02 NOTE — Progress Notes (Signed)
History of Present Illness  There is no documented history at this time  Assessments & Plan   There are no diagnoses linked to this encounter.    Additional instructions  Subjective:  Patient presents with venous ulcer of the Bilateral lower extremity.    Procedure:  3 layer unna wrap was placed Bilateral lower extremity.   Plan:   Follow up in one week.  

## 2021-05-09 ENCOUNTER — Encounter (INDEPENDENT_AMBULATORY_CARE_PROVIDER_SITE_OTHER): Payer: Medicare Other

## 2021-05-09 ENCOUNTER — Ambulatory Visit (INDEPENDENT_AMBULATORY_CARE_PROVIDER_SITE_OTHER): Payer: Medicare Other | Admitting: Nurse Practitioner

## 2021-05-09 VITALS — BP 153/93 | HR 66 | Resp 16 | Ht 72.0 in | Wt 288.0 lb

## 2021-05-09 DIAGNOSIS — L97212 Non-pressure chronic ulcer of right calf with fat layer exposed: Secondary | ICD-10-CM

## 2021-05-09 NOTE — Progress Notes (Signed)
History of Present Illness  There is no documented history at this time  Assessments & Plan   There are no diagnoses linked to this encounter.    Additional instructions  Subjective:  Patient presents with venous ulcer of the Bilateral lower extremity.    Procedure:  3 layer unna wrap was placed Bilateral lower extremity.   Plan:   Follow up in one week.  

## 2021-05-13 ENCOUNTER — Encounter (INDEPENDENT_AMBULATORY_CARE_PROVIDER_SITE_OTHER): Payer: Self-pay | Admitting: Nurse Practitioner

## 2021-05-16 ENCOUNTER — Ambulatory Visit (INDEPENDENT_AMBULATORY_CARE_PROVIDER_SITE_OTHER): Payer: Medicare Other | Admitting: Nurse Practitioner

## 2021-05-16 ENCOUNTER — Encounter (INDEPENDENT_AMBULATORY_CARE_PROVIDER_SITE_OTHER): Payer: Medicare Other

## 2021-05-16 ENCOUNTER — Encounter (INDEPENDENT_AMBULATORY_CARE_PROVIDER_SITE_OTHER): Payer: Self-pay

## 2021-05-16 VITALS — BP 137/86 | HR 69 | Wt 288.0 lb

## 2021-05-16 DIAGNOSIS — I890 Lymphedema, not elsewhere classified: Secondary | ICD-10-CM

## 2021-05-16 NOTE — Progress Notes (Signed)
History of Present Illness  There is no documented history at this time  Assessments & Plan   There are no diagnoses linked to this encounter.    Additional instructions  Subjective:  Patient presents with venous ulcer of the Bilateral lower extremity.    Procedure:  3 layer unna wrap was placed Bilateral lower extremity.   Plan:   Follow up in one week.  

## 2021-05-22 ENCOUNTER — Encounter (INDEPENDENT_AMBULATORY_CARE_PROVIDER_SITE_OTHER): Payer: Self-pay | Admitting: Nurse Practitioner

## 2021-05-23 ENCOUNTER — Encounter (INDEPENDENT_AMBULATORY_CARE_PROVIDER_SITE_OTHER): Payer: Self-pay | Admitting: Nurse Practitioner

## 2021-05-23 ENCOUNTER — Ambulatory Visit (INDEPENDENT_AMBULATORY_CARE_PROVIDER_SITE_OTHER): Payer: Medicare Other | Admitting: Nurse Practitioner

## 2021-05-23 ENCOUNTER — Encounter (INDEPENDENT_AMBULATORY_CARE_PROVIDER_SITE_OTHER): Payer: Medicare Other

## 2021-05-23 VITALS — BP 135/86 | HR 59 | Resp 16 | Ht 72.0 in | Wt 291.0 lb

## 2021-05-23 DIAGNOSIS — L97212 Non-pressure chronic ulcer of right calf with fat layer exposed: Secondary | ICD-10-CM | POA: Diagnosis not present

## 2021-05-23 NOTE — Progress Notes (Signed)
History of Present Illness  There is no documented history at this time  Assessments & Plan   There are no diagnoses linked to this encounter.    Additional instructions  Subjective:  Patient presents with venous ulcer of the Bilateral lower extremity.    Procedure:  3 layer unna wrap was placed Bilateral lower extremity.   Plan:   Follow up in one week.  

## 2021-05-24 ENCOUNTER — Other Ambulatory Visit: Payer: Self-pay | Admitting: Orthopedic Surgery

## 2021-05-24 DIAGNOSIS — S42292D Other displaced fracture of upper end of left humerus, subsequent encounter for fracture with routine healing: Secondary | ICD-10-CM

## 2021-05-30 ENCOUNTER — Ambulatory Visit (INDEPENDENT_AMBULATORY_CARE_PROVIDER_SITE_OTHER): Payer: Medicare Other | Admitting: Nurse Practitioner

## 2021-05-30 ENCOUNTER — Encounter (INDEPENDENT_AMBULATORY_CARE_PROVIDER_SITE_OTHER): Payer: Self-pay

## 2021-05-30 ENCOUNTER — Encounter (INDEPENDENT_AMBULATORY_CARE_PROVIDER_SITE_OTHER): Payer: Medicare Other

## 2021-05-30 VITALS — BP 156/87 | HR 65 | Resp 16 | Wt 292.0 lb

## 2021-05-30 DIAGNOSIS — L97212 Non-pressure chronic ulcer of right calf with fat layer exposed: Secondary | ICD-10-CM | POA: Diagnosis not present

## 2021-05-30 NOTE — Progress Notes (Signed)
History of Present Illness  There is no documented history at this time  Assessments & Plan   There are no diagnoses linked to this encounter.    Additional instructions  Subjective:  Patient presents with venous ulcer of the Bilateral lower extremity.    Procedure:  3 layer unna wrap was placed Bilateral lower extremity.   Plan:   Follow up in one week.  

## 2021-06-02 ENCOUNTER — Ambulatory Visit
Admission: RE | Admit: 2021-06-02 | Discharge: 2021-06-02 | Disposition: A | Discharge status: Home / Self Care | Payer: Medicare Other | Source: Ambulatory Visit | Attending: Orthopedic Surgery | Admitting: Orthopedic Surgery

## 2021-06-02 DIAGNOSIS — S42292D Other displaced fracture of upper end of left humerus, subsequent encounter for fracture with routine healing: Secondary | ICD-10-CM

## 2021-06-02 IMAGING — MR MR SHOULDER*L* W/O CM
4 of 5 series · 26 of 40 positions shown · non-contrast
Comparison: Left shoulder radiographs [DATE]

CLINICAL DATA: Fell [DATE].  Humerus fracture.

EXAM:
MRI OF THE LEFT SHOULDER WITHOUT CONTRAST
TECHNIQUE: Multiplanar, multisequence MR imaging of the shoulder was performed.
No intravenous contrast was administered.

[Series 5: T2 fat-sat · axial · left · 4.0mm · 0.44mm/px · z∈[-106,+7]mm · 8 of 26 slices shown (1 of 3)]
[im 1/26]
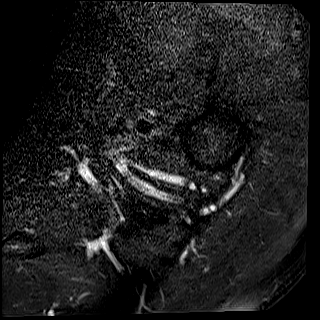
[im 4/26]
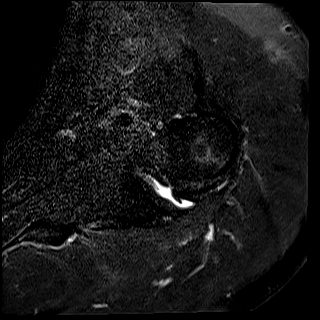
[im 8/26]
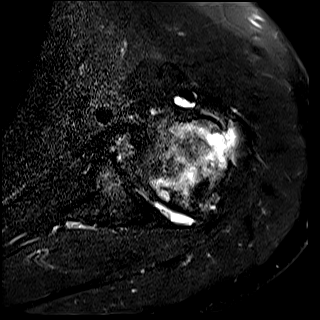
[im 11/26]
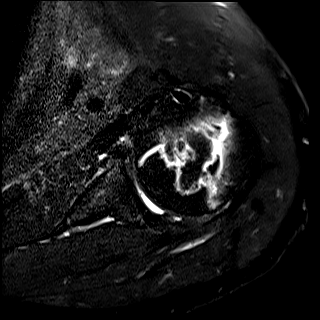
[im 15/26]
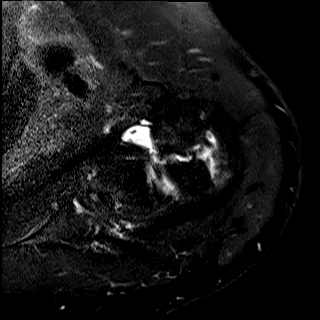
[im 18/26]
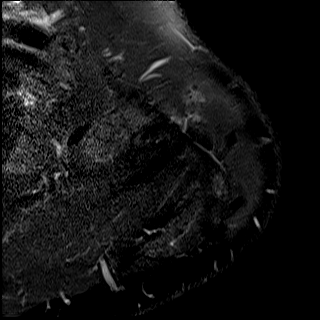
[im 22/26]
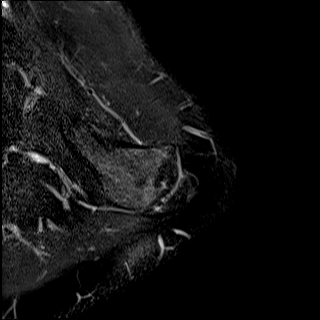
[im 26/26]
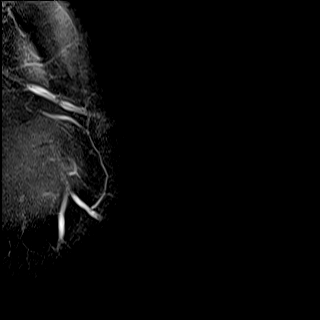

[Series 8: T2 fat-sat · oblique · left · 4.0mm · 0.22mm/px · 6 of 22 slices shown (2 of 3)]
[im 1/22]
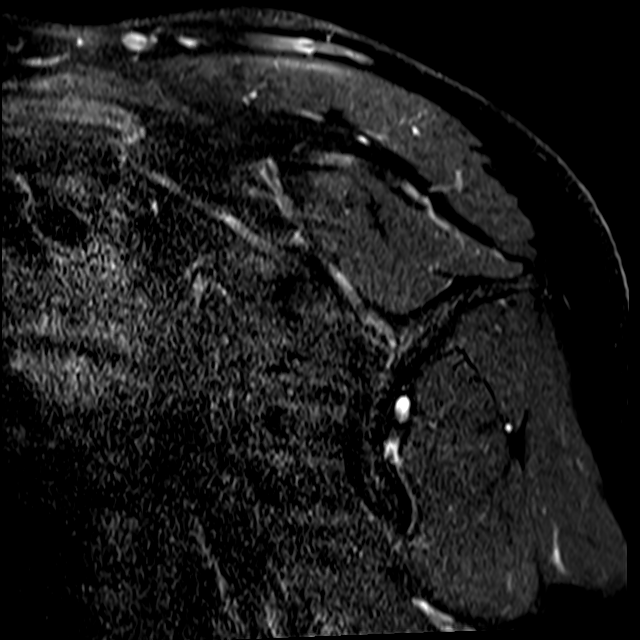
[im 4/22]
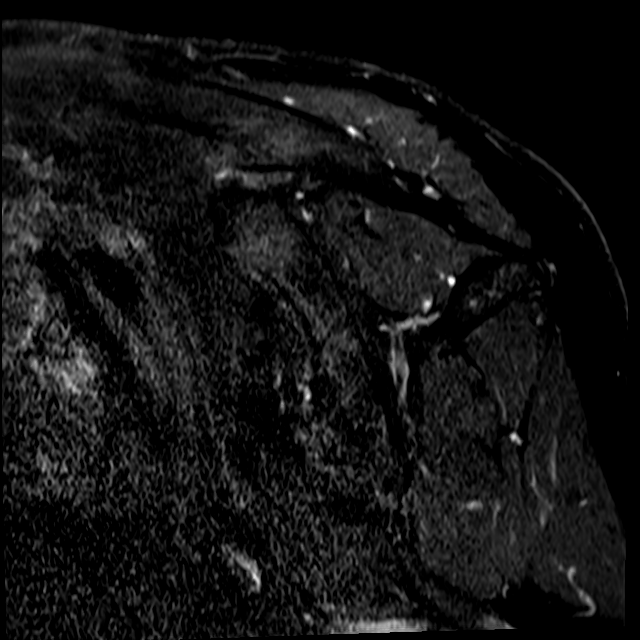
[im 8/22]
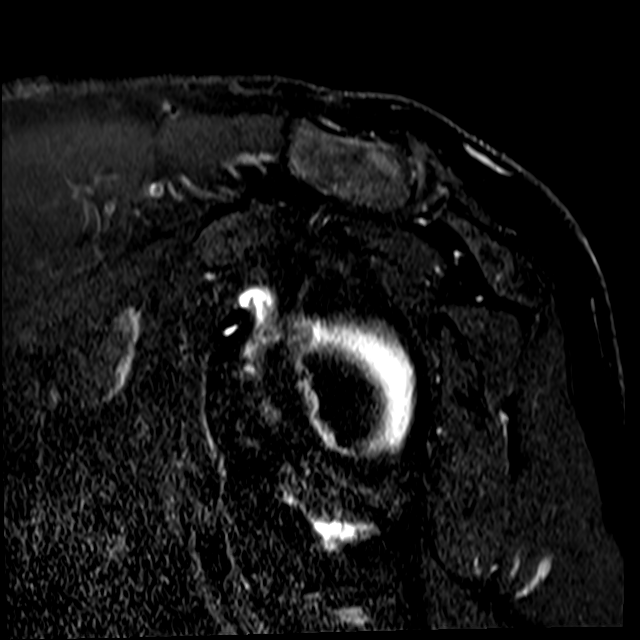
[im 11/22]
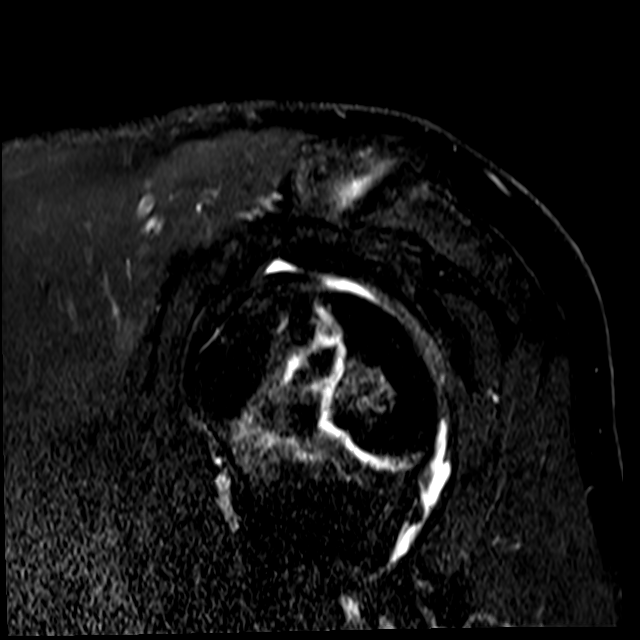
[im 15/22]
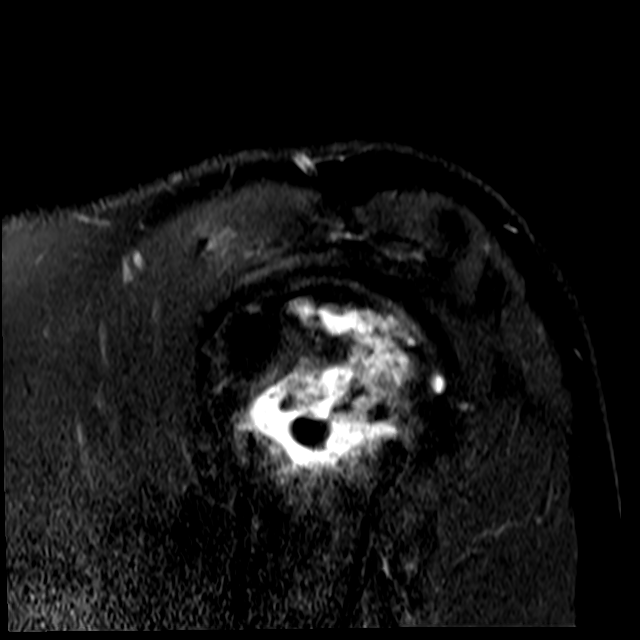
[im 18/22]
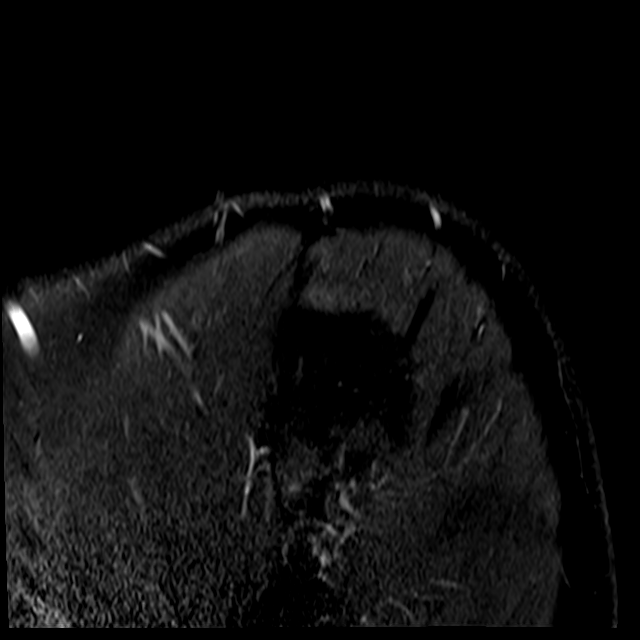

[Series 1009: PD · oblique · left · 4.0mm · 0.44mm/px · 9 of 26 slices shown]
[im 1/26]
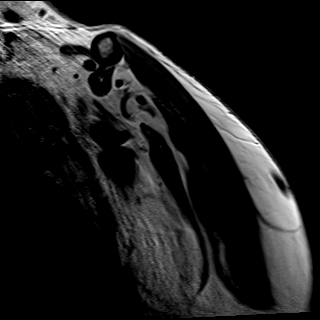
[im 4/26]
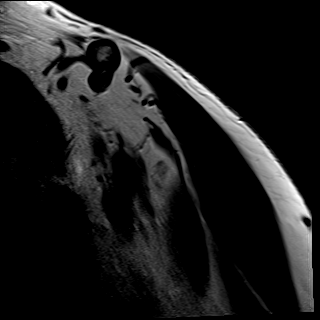
[im 7/26]
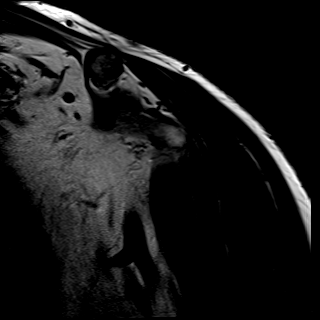
[im 10/26]
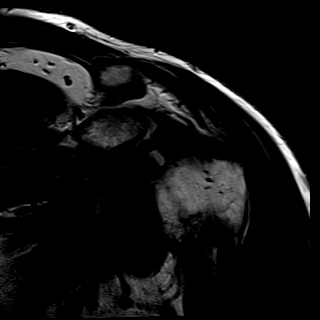
[im 13/26]
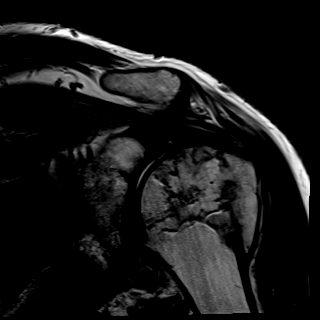
[im 16/26]
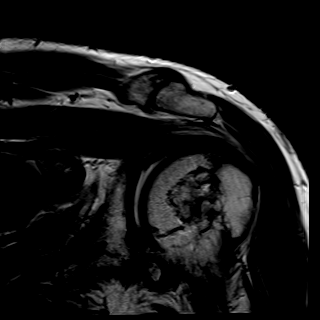
[im 19/26]
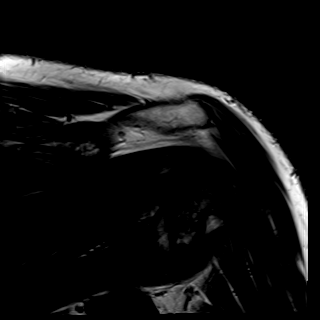
[im 22/26]
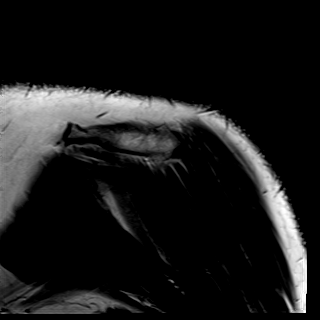
[im 26/26]
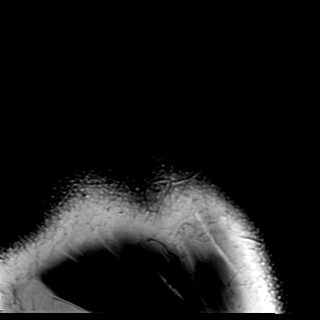

[Series 1016: T2 fat-sat · oblique · left · 4.0mm · 0.44mm/px · 3 of 26 slices shown (3 of 3)]
[im 4/26]
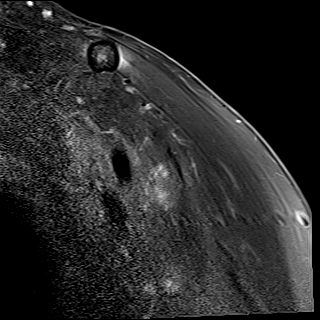
[im 13/26]
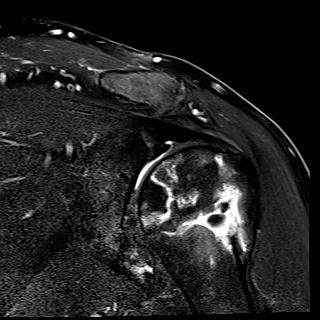
[im 22/26]
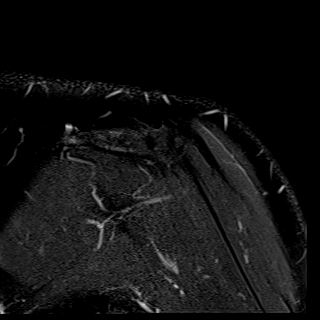

[26 of 40 positions shown; findings below may reference images not displayed]

FINDINGS: Rotator cuff: Mild intermediate T2 signal posterior supraspinatus
and anterior infraspinatus tendinosis. Mild linear fluid bright
signal along the longitudinal dimension of the superior
subscapularis tendon (sagittal series 8 images 8 through 11), a
small partial-thickness interstitial tear. The teres minor is
intact.

Muscles: No rotator cuff muscle atrophy, fatty infiltration, or
edema.

Biceps long head: The intra-articular long head of the biceps tendon
is intact.

Acromioclavicular Joint: There are moderate degenerative changes of
the acromioclavicular joint including joint space narrowing,
subchondral marrow edema, and peripheral osteophytosis. Type II
acromion. No subacromial/subdeltoid bursitis.

Glenohumeral Joint: Moderate thinning of the anterior glenoid and
inferior medial humeral head cartilage. High-grade thinning of the
anterior glenoid cartilage with the anterior inferior glenoid
subchondral marrow edema. Possible nondisplaced anterior inferior
glenoid fracture line (sagittal series 9, image 5).

Mild glenohumeral joint effusion with multiple tiny anterior
inferior low signal foci, likely small loose bodies.

Labrum: Intermediate T2 signal and thickening degenerative
irregularity of the anterior inferior glenoid labrum.

Bones: There are diffuse decreased T1 increased T2 signal fluid
bright fracture lines corresponding to the previously seen markedly
comminuted fracture involving the majority of the humeral head and
extending into the anterior and lateral aspects of the surgical
neck. There is up to 10 mm lateral cortical step-off of the
inferolateral aspect of the humeral head (axial series 5, image 19).
There is approximately 5 mm medial displacement of the distal
fracture component (humeral shaft) with respect to the proximal
fracture component (humeral head). The anteromedial humeral head
fracture lines extend to the anteromedial cartilage surface (axial
images 15 through 18).

Other: None.
IMPRESSION: :
IMPRESSION: 1. Markedly comminuted acute fracture of the humeral head and
surgical neck with 5 mm medial displacement of the distal fracture
component of the humeral shaft.
2. Moderate anterior inferior glenoid cartilage degenerative changes
with subchondral marrow edema. Possible nondisplaced anterior
inferior glenoid fracture line.
3. Mild posterior supraspinatus and anterior infraspinatus
tendinosis. Tiny linear superior subscapularis longitudinal
midsubstance tear without tendon retraction.
4. Moderate degenerative changes of the acromioclavicular joint.

## 2021-06-04 ENCOUNTER — Encounter (INDEPENDENT_AMBULATORY_CARE_PROVIDER_SITE_OTHER): Payer: Self-pay | Admitting: Nurse Practitioner

## 2021-06-06 ENCOUNTER — Ambulatory Visit (INDEPENDENT_AMBULATORY_CARE_PROVIDER_SITE_OTHER): Payer: Medicare Other | Admitting: Vascular Surgery

## 2021-06-06 ENCOUNTER — Encounter (INDEPENDENT_AMBULATORY_CARE_PROVIDER_SITE_OTHER): Payer: Self-pay | Admitting: Vascular Surgery

## 2021-06-06 VITALS — BP 149/83 | HR 65 | Resp 16 | Wt 289.0 lb

## 2021-06-06 DIAGNOSIS — L97212 Non-pressure chronic ulcer of right calf with fat layer exposed: Secondary | ICD-10-CM | POA: Diagnosis not present

## 2021-06-06 DIAGNOSIS — M7989 Other specified soft tissue disorders: Secondary | ICD-10-CM | POA: Diagnosis not present

## 2021-06-06 DIAGNOSIS — I890 Lymphedema, not elsewhere classified: Secondary | ICD-10-CM | POA: Diagnosis not present

## 2021-06-06 DIAGNOSIS — I10 Essential (primary) hypertension: Secondary | ICD-10-CM

## 2021-06-06 NOTE — Progress Notes (Signed)
? ? ?[name] : [id number] ? ?Warren Leblanc is a 76 y.o. (06/10/1945) male who presents with chief complaint of  ?Chief Complaint  ?Patient presents with  ? Follow-up  ?  Unna wrap check ?  ?. ? ?History of Present Illness: Patient returns today in follow up of his leg swelling and lymphedema.  He has a little leg swelling over the Unna boot on the right but all in all his legs are stable to slightly improved.  He has consideration for upcoming left shoulder surgery but knows this will likely make his arm and leg swell worse going forward. ? ?Current Outpatient Medications  ?Medication Sig Dispense Refill  ? ACIDOPHILUS LACTOBACILLUS PO Take by mouth.    ? Ascorbic Acid (VITAMIN C) 1000 MG tablet Take by mouth.    ? Biotin 2.5 MG TABS Take by mouth.    ? Calcium Ascorbate 500 MG TABS Take by mouth.    ? Cyanocobalamin (VITAMIN B 12 PO) Take by mouth.    ? Ergocalciferol 2000 units TABS Take by mouth.    ? folic acid (FOLVITE) 1 MG tablet Take by mouth.    ? Ginger Oil OIL by Does not apply route daily.    ? lisinopril-hydrochlorothiazide (ZESTORETIC) 20-12.5 MG tablet Take 2 tablets by mouth daily.     ? Magnesium 100 MG CAPS Take by mouth.    ? MAGNESIUM OXIDE PO Take by mouth.    ? Nadide POWD Take one capsule by mouth twice daily.    ? Potassium 99 MG TABS Take by mouth.    ? Taurine 1000 MG CAPS Take by mouth.    ? TURMERIC PO Take by mouth.    ? UNABLE TO FIND Carnivora    ? amoxicillin (AMOXIL) 500 MG capsule amoxicillin 500 mg capsule (Patient not taking: Reported on 04/25/2021)    ? aspirin EC 81 MG tablet Take 81 mg by mouth daily. (Patient not taking: Reported on 02/14/2021)    ? furosemide (LASIX) 40 MG tablet Take by mouth as needed.  (Patient not taking: Reported on 02/14/2021)    ? ?No current facility-administered medications for this visit.  ? ? ?Past Medical History:  ?Diagnosis Date  ? Hypertension   ? ? ?No past surgical history on file. ? ? ?Social History  ? ?Tobacco Use  ? Smoking status: Some Days  ?  Smokeless tobacco: Never  ?Substance Use Topics  ? Alcohol use: No  ? Drug use: No  ? ? ? ? ?No family history on file. ? ? ?Allergies  ?Allergen Reactions  ? Amoxicillin   ? Amoxicillin-Pot Clavulanate   ? Antihistamines, Chlorpheniramine-Type Nausea Only  ? Diphenhydramine Hcl   ?  Other reaction(s): Other (See Comments) ?drowsy  ? ? ?REVIEW OF SYSTEMS (Negative unless checked) ?  ?Constitutional: [] Weight loss  [] Fever  [] Chills ?Cardiac: [] Chest pain   [] Chest pressure   [] Palpitations   [] Shortness of breath when laying flat   [] Shortness of breath at rest   [] Shortness of breath with exertion. ?Vascular:  [x] Pain in legs with walking   [x] Pain in legs at rest   [] Pain in legs when laying flat   [] Claudication   [] Pain in feet when walking  [] Pain in feet at rest  [] Pain in feet when laying flat   [] History of DVT   [] Phlebitis   [x] Swelling in legs   [] Varicose veins   [] Non-healing ulcers ?Pulmonary:   [] Uses home oxygen   [] Productive cough   [] Hemoptysis   []   Wheeze  [] COPD   [] Asthma ?Neurologic:  [] Dizziness  [] Blackouts   [] Seizures   [] History of stroke   [] History of TIA  [] Aphasia   [] Temporary blindness   [] Dysphagia   [] Weakness or numbness in arms   [] Weakness or numbness in legs ?Musculoskeletal:  [x] Arthritis   [] Joint swelling   [x] Joint pain   [] Low back pain ?Hematologic:  [] Easy bruising  [] Easy bleeding   [] Hypercoagulable state   [] Anemic   ?Gastrointestinal:  [] Blood in stool   [] Vomiting blood  [] Gastroesophageal reflux/heartburn   [] Abdominal pain ?Genitourinary:  [] Chronic kidney disease   [] Difficult urination  [] Frequent urination  [] Burning with urination   [] Hematuria ?Skin:  [x] Rashes   [] Ulcers   [] Wounds ?Psychological:  [] History of anxiety   []  History of major depression ? ?Physical Examination ? ?BP (!) 149/83 (BP Location: Right Arm)   Pulse 65   Resp 16   Wt 289 lb (131.1 kg)   BMI 39.20 kg/m?  ?Gen:  WD/WN, NAD ?Head: [place]/AT, No temporalis wasting. ?Ear/Nose/Throat:  Hearing grossly intact, nares w/o erythema or drainage ?Eyes: Conjunctiva clear. Sclera non-icteric ?Neck: Supple.  Trachea midline ?Pulmonary:  Good air movement, no use of accessory muscles.  ?Cardiac: RRR, no JVD ?Vascular:  ?Vessel Right Left  ?Radial Palpable Palpable  ?    ?    ? ?Musculoskeletal: M/S 5/5 throughout.  No deformity or atrophy. 1-2+ RLE edema, 2-3+ LLE edema. ?Neurologic: Sensation grossly intact in extremities.  Symmetrical.  Speech is fluent.  ?Psychiatric: Judgment intact, Mood & affect appropriate for pt's clinical situation. ?Dermatologic: No rashes or ulcers noted.  No cellulitis or open wounds. ? ? ? ? ? ?Labs ?No results found for this or any previous visit (from the past 2160 hour(s)). ? ?Radiology ?MR SHOULDER LEFT WO CONTRAST ? ?Result Date: 06/02/2021 ?CLINICAL DATA:  06/01/2020.  Humerus fracture. EXAM: MRI OF THE LEFT SHOULDER WITHOUT CONTRAST TECHNIQUE: Multiplanar, multisequence MR imaging of the shoulder was performed. No intravenous contrast was administered. COMPARISON:  Left shoulder radiographs 06/01/2020 FINDINGS: Rotator cuff: Mild intermediate T2 signal posterior supraspinatus and anterior infraspinatus tendinosis. Mild linear fluid bright signal along the longitudinal dimension of the superior subscapularis tendon (sagittal series 8 images 8 through 11), a small partial-thickness interstitial tear. The teres minor is intact. Muscles: No rotator cuff muscle atrophy, fatty infiltration, or edema. Biceps long head: The intra-articular long head of the biceps tendon is intact. Acromioclavicular Joint: There are moderate degenerative changes of the acromioclavicular joint including joint space narrowing, subchondral marrow edema, and peripheral osteophytosis. Type II acromion. No subacromial/subdeltoid bursitis. Glenohumeral Joint: Moderate thinning of the anterior glenoid and inferior medial humeral head cartilage. High-grade thinning of the anterior glenoid cartilage  with the anterior inferior glenoid subchondral marrow edema. Possible nondisplaced anterior inferior glenoid fracture line (sagittal series 9, image 5). Mild glenohumeral joint effusion with multiple tiny anterior inferior low signal foci, likely small loose bodies. Labrum: Intermediate T2 signal and thickening degenerative irregularity of the anterior inferior glenoid labrum. Bones: There are diffuse decreased T1 increased T2 signal fluid bright fracture lines corresponding to the previously seen markedly comminuted fracture involving the majority of the humeral head and extending into the anterior and lateral aspects of the surgical neck. There is up to 10 mm lateral cortical step-off of the inferolateral aspect of the humeral head (axial series 5, image 19). There is approximately 5 mm medial displacement of the distal fracture component (humeral shaft) with respect to the proximal fracture component (humeral head).  The anteromedial humeral head fracture lines extend to the anteromedial cartilage surface (axial images 15 through 18). Other: None. IMPRESSION:: IMPRESSION: 1. Markedly comminuted acute fracture of the humeral head and surgical neck with 5 mm medial displacement of the distal fracture component of the humeral shaft. 2. Moderate anterior inferior glenoid cartilage degenerative changes with subchondral marrow edema. Possible nondisplaced anterior inferior glenoid fracture line. 3. Mild posterior supraspinatus and anterior infraspinatus tendinosis. Tiny linear superior subscapularis longitudinal midsubstance tear without tendon retraction. 4. Moderate degenerative changes of the acromioclavicular joint. Electronically Signed   By: Neita Garnetonald  Viola M.D.   On: 06/02/2021 10:35   ? ?Assessment/Plan ? ?Essential hypertension, benign ?blood pressure control important in reducing the progression of atherosclerotic disease. On appropriate oral medications. ?  ?  ?Cellulitis of leg, right ?Several months ago, had  a course of amoxil, improved ?  ?Swelling of limb ?Stable, mild improvement.  Remains significant ?  ?Lymphedema ?Stable.  3 layer Unna boot is placed bilaterally again today and will be changed weekly.  Reche

## 2021-06-13 ENCOUNTER — Encounter (INDEPENDENT_AMBULATORY_CARE_PROVIDER_SITE_OTHER): Payer: Self-pay | Admitting: Nurse Practitioner

## 2021-06-13 ENCOUNTER — Ambulatory Visit (INDEPENDENT_AMBULATORY_CARE_PROVIDER_SITE_OTHER): Payer: Medicare Other

## 2021-06-13 ENCOUNTER — Ambulatory Visit (INDEPENDENT_AMBULATORY_CARE_PROVIDER_SITE_OTHER): Payer: Medicare Other | Admitting: Nurse Practitioner

## 2021-06-13 VITALS — BP 132/84 | HR 67 | Resp 16 | Wt 284.0 lb

## 2021-06-13 DIAGNOSIS — L97212 Non-pressure chronic ulcer of right calf with fat layer exposed: Secondary | ICD-10-CM | POA: Diagnosis not present

## 2021-06-13 NOTE — Progress Notes (Signed)
History of Present Illness  There is no documented history at this time  Assessments & Plan   There are no diagnoses linked to this encounter.    Additional instructions  Subjective:  Patient presents with venous ulcer of the Bilateral lower extremity.    Procedure:  3 layer unna wrap was placed Bilateral lower extremity.   Plan:   Follow up in one week.  

## 2021-06-20 ENCOUNTER — Encounter (INDEPENDENT_AMBULATORY_CARE_PROVIDER_SITE_OTHER): Payer: Self-pay | Admitting: Nurse Practitioner

## 2021-06-20 ENCOUNTER — Ambulatory Visit (INDEPENDENT_AMBULATORY_CARE_PROVIDER_SITE_OTHER): Payer: Medicare Other | Admitting: Nurse Practitioner

## 2021-06-20 VITALS — BP 134/76 | HR 61 | Resp 16 | Wt 284.0 lb

## 2021-06-20 DIAGNOSIS — I890 Lymphedema, not elsewhere classified: Secondary | ICD-10-CM | POA: Diagnosis not present

## 2021-06-20 NOTE — Progress Notes (Signed)
History of Present Illness  There is no documented history at this time  Assessments & Plan   There are no diagnoses linked to this encounter.    Additional instructions  Subjective:  Patient presents with venous ulcer of the Bilateral lower extremity.    Procedure:  3 layer unna wrap was placed Bilateral lower extremity.   Plan:   Follow up in one week.  

## 2021-06-24 ENCOUNTER — Encounter (INDEPENDENT_AMBULATORY_CARE_PROVIDER_SITE_OTHER): Payer: Self-pay | Admitting: Nurse Practitioner

## 2021-06-26 ENCOUNTER — Encounter (INDEPENDENT_AMBULATORY_CARE_PROVIDER_SITE_OTHER): Payer: Self-pay | Admitting: Nurse Practitioner

## 2021-06-27 ENCOUNTER — Ambulatory Visit (INDEPENDENT_AMBULATORY_CARE_PROVIDER_SITE_OTHER): Payer: Medicare Other | Admitting: Nurse Practitioner

## 2021-06-27 ENCOUNTER — Encounter (INDEPENDENT_AMBULATORY_CARE_PROVIDER_SITE_OTHER): Payer: Self-pay

## 2021-06-27 VITALS — BP 153/78 | HR 67 | Resp 16 | Wt 282.0 lb

## 2021-06-27 DIAGNOSIS — L97212 Non-pressure chronic ulcer of right calf with fat layer exposed: Secondary | ICD-10-CM

## 2021-06-27 NOTE — Progress Notes (Signed)
History of Present Illness  There is no documented history at this time  Assessments & Plan   There are no diagnoses linked to this encounter.    Additional instructions  Subjective:  Patient presents with venous ulcer of the Bilateral lower extremity.    Procedure:  3 layer unna wrap was placed Bilateral lower extremity.   Plan:   Follow up in one week.  

## 2021-07-03 ENCOUNTER — Encounter (INDEPENDENT_AMBULATORY_CARE_PROVIDER_SITE_OTHER): Payer: Self-pay | Admitting: Nurse Practitioner

## 2021-07-04 ENCOUNTER — Encounter (INDEPENDENT_AMBULATORY_CARE_PROVIDER_SITE_OTHER): Payer: Self-pay | Admitting: Nurse Practitioner

## 2021-07-04 ENCOUNTER — Ambulatory Visit (INDEPENDENT_AMBULATORY_CARE_PROVIDER_SITE_OTHER): Payer: Medicare Other | Admitting: Nurse Practitioner

## 2021-07-04 VITALS — BP 133/87 | HR 85 | Resp 16 | Wt 283.0 lb

## 2021-07-04 DIAGNOSIS — L97212 Non-pressure chronic ulcer of right calf with fat layer exposed: Secondary | ICD-10-CM | POA: Diagnosis not present

## 2021-07-04 NOTE — Progress Notes (Signed)
History of Present Illness  There is no documented history at this time  Assessments & Plan   There are no diagnoses linked to this encounter.    Additional instructions  Subjective:  Patient presents with venous ulcer of the Bilateral lower extremity.    Procedure:  3 layer unna wrap was placed Bilateral lower extremity.   Plan:   Follow up in one week.  

## 2021-07-10 ENCOUNTER — Encounter (INDEPENDENT_AMBULATORY_CARE_PROVIDER_SITE_OTHER): Payer: Self-pay | Admitting: Nurse Practitioner

## 2021-07-11 ENCOUNTER — Encounter (INDEPENDENT_AMBULATORY_CARE_PROVIDER_SITE_OTHER): Payer: Self-pay | Admitting: Vascular Surgery

## 2021-07-11 ENCOUNTER — Ambulatory Visit (INDEPENDENT_AMBULATORY_CARE_PROVIDER_SITE_OTHER): Payer: Medicare Other | Admitting: Vascular Surgery

## 2021-07-11 VITALS — BP 134/84 | HR 59 | Resp 17 | Ht 72.0 in | Wt 283.0 lb

## 2021-07-11 DIAGNOSIS — I890 Lymphedema, not elsewhere classified: Secondary | ICD-10-CM | POA: Diagnosis not present

## 2021-07-11 DIAGNOSIS — L03115 Cellulitis of right lower limb: Secondary | ICD-10-CM

## 2021-07-11 DIAGNOSIS — I10 Essential (primary) hypertension: Secondary | ICD-10-CM

## 2021-07-11 DIAGNOSIS — M7989 Other specified soft tissue disorders: Secondary | ICD-10-CM

## 2021-07-11 NOTE — Progress Notes (Signed)
[name] : [id number]  Warren Leblanc is a 76 y.o. (1945-10-26) male who presents with chief complaint of No chief complaint on file. Marland Kitchen  History of Present Illness: Patient returns today in follow up of his lymphedema and leg swelling.  His symptoms are basically stable.  His right leg is a little bit better.  Left leg is about the same.  No fevers or chills or signs of infection.  Current Outpatient Medications  Medication Sig Dispense Refill   ACIDOPHILUS LACTOBACILLUS PO Take by mouth.     Ascorbic Acid (VITAMIN C) 1000 MG tablet Take by mouth.     Biotin 2.5 MG TABS Take by mouth.     Calcium Ascorbate 500 MG TABS Take by mouth.     Cyanocobalamin (VITAMIN B 12 PO) Take by mouth.     Ergocalciferol 2000 units TABS Take by mouth.     folic acid (FOLVITE) 1 MG tablet Take by mouth.     Ginger Oil OIL by Does not apply route daily.     lisinopril-hydrochlorothiazide (ZESTORETIC) 20-12.5 MG tablet Take 2 tablets by mouth daily.      Magnesium 100 MG CAPS Take by mouth.     MAGNESIUM OXIDE PO Take by mouth.     Nadide POWD Take one capsule by mouth twice daily.     Potassium 99 MG TABS Take by mouth.     Taurine 1000 MG CAPS Take by mouth.     TURMERIC PO Take by mouth.     UNABLE TO FIND Carnivora     amoxicillin (AMOXIL) 500 MG capsule amoxicillin 500 mg capsule (Patient not taking: Reported on 04/25/2021)     aspirin EC 81 MG tablet Take 81 mg by mouth daily. (Patient not taking: Reported on 02/14/2021)     furosemide (LASIX) 40 MG tablet Take by mouth as needed.  (Patient not taking: Reported on 02/14/2021)     No current facility-administered medications for this visit.    Past Medical History:  Diagnosis Date   Hypertension     No past surgical history on file.   Social History   Tobacco Use   Smoking status: Some Days   Smokeless tobacco: Never  Substance Use Topics   Alcohol use: No   Drug use: No      No family history on file.   Allergies  Allergen  Reactions   Amoxicillin    Amoxicillin-Pot Clavulanate    Antihistamines, Chlorpheniramine-Type Nausea Only   Diphenhydramine Hcl     Other reaction(s): Other (See Comments) drowsy    REVIEW OF SYSTEMS (Negative unless checked)   Constitutional: [] Weight loss  [] Fever  [] Chills Cardiac: [] Chest pain   [] Chest pressure   [] Palpitations   [] Shortness of breath when laying flat   [] Shortness of breath at rest   [] Shortness of breath with exertion. Vascular:  [x] Pain in legs with walking   [x] Pain in legs at rest   [] Pain in legs when laying flat   [] Claudication   [] Pain in feet when walking  [] Pain in feet at rest  [] Pain in feet when laying flat   [] History of DVT   [] Phlebitis   [x] Swelling in legs   [] Varicose veins   [] Non-healing ulcers Pulmonary:   [] Uses home oxygen   [] Productive cough   [] Hemoptysis   [] Wheeze  [] COPD   [] Asthma Neurologic:  [] Dizziness  [] Blackouts   [] Seizures   [] History of stroke   [] History of TIA  [] Aphasia   [] Temporary  blindness   [] Dysphagia   [] Weakness or numbness in arms   [] Weakness or numbness in legs Musculoskeletal:  [x] Arthritis   [] Joint swelling   [x] Joint pain   [] Low back pain Hematologic:  [] Easy bruising  [] Easy bleeding   [] Hypercoagulable state   [] Anemic   Gastrointestinal:  [] Blood in stool   [] Vomiting blood  [] Gastroesophageal reflux/heartburn   [] Abdominal pain Genitourinary:  [] Chronic kidney disease   [] Difficult urination  [] Frequent urination  [] Burning with urination   [] Hematuria Skin:  [x] Rashes   [] Ulcers   [] Wounds Psychological:  [] History of anxiety   []  History of major depression  Physical Examination  BP 134/84 (BP Location: Right Arm)   Pulse (!) 59   Resp 17   Ht 6' (1.829 m)   Wt 283 lb (128.4 kg)   BMI 38.38 kg/m  Gen:  WD/WN, NAD Head: [place]/AT, No temporalis wasting. Ear/Nose/Throat: Hearing grossly intact, nares w/o erythema or drainage Eyes: Conjunctiva clear. Sclera non-icteric Neck: Supple.  Trachea  midline Pulmonary:  Good air movement, no use of accessory muscles.  Cardiac: RRR, no JVD Vascular:  Vessel Right Left  Radial Palpable Palpable           Musculoskeletal: M/S 5/5 throughout.  No deformity or atrophy.  1-2+ right lower extremity edema, 2-3+ left lower extremity edema. Neurologic: Sensation grossly intact in extremities.  Symmetrical.  Speech is fluent.  Psychiatric: Judgment intact, Mood & affect appropriate for pt's clinical situation. Dermatologic: No rashes or ulcers noted.  No cellulitis or open wounds.      Labs No results found for this or any previous visit (from the past 2160 hour(s)).  Radiology No results found.  Assessment/Plan Essential hypertension, benign blood pressure control important in reducing the progression of atherosclerotic disease. On appropriate oral medications.     Cellulitis of leg, right Several months ago, had a course of amoxil, improved   Swelling of limb Stable, mild improvement.  Remains significant   Lymphedema Stable.  3 layer Unna boot is placed bilaterally again today and will be changed weekly.  Recheck in 4 to 6 weeks.   , MD  07/11/2021 10:54 AM    This note was created with Dragon medical transcription system.  Any errors from dictation are purely unintentional

## 2021-07-18 ENCOUNTER — Ambulatory Visit (INDEPENDENT_AMBULATORY_CARE_PROVIDER_SITE_OTHER): Payer: Medicare Other | Admitting: Nurse Practitioner

## 2021-07-18 ENCOUNTER — Encounter (INDEPENDENT_AMBULATORY_CARE_PROVIDER_SITE_OTHER): Payer: Self-pay

## 2021-07-18 VITALS — BP 138/79 | HR 62 | Resp 16 | Wt 283.0 lb

## 2021-07-18 DIAGNOSIS — I890 Lymphedema, not elsewhere classified: Secondary | ICD-10-CM | POA: Diagnosis not present

## 2021-07-18 NOTE — Progress Notes (Signed)
History of Present Illness  There is no documented history at this time  Assessments & Plan   There are no diagnoses linked to this encounter.    Additional instructions  Subjective:  Patient presents with venous ulcer of the Bilateral lower extremity.    Procedure:  3 layer unna wrap was placed Bilateral lower extremity.   Plan:   Follow up in one week.  

## 2021-07-25 ENCOUNTER — Encounter (INDEPENDENT_AMBULATORY_CARE_PROVIDER_SITE_OTHER): Payer: Self-pay | Admitting: Nurse Practitioner

## 2021-07-25 ENCOUNTER — Ambulatory Visit (INDEPENDENT_AMBULATORY_CARE_PROVIDER_SITE_OTHER): Payer: Medicare Other | Admitting: Vascular Surgery

## 2021-07-25 ENCOUNTER — Encounter (INDEPENDENT_AMBULATORY_CARE_PROVIDER_SITE_OTHER): Payer: Self-pay | Admitting: Vascular Surgery

## 2021-07-25 VITALS — BP 130/85 | HR 70 | Resp 16 | Wt 285.0 lb

## 2021-07-25 DIAGNOSIS — I890 Lymphedema, not elsewhere classified: Secondary | ICD-10-CM | POA: Diagnosis not present

## 2021-07-25 NOTE — Progress Notes (Signed)
History of Present Illness  There is no documented history at this time  Assessments & Plan   There are no diagnoses linked to this encounter.    Additional instructions  Subjective:  Patient presents with venous ulcer of the Bilateral lower extremity.    Procedure:  3 layer unna wrap was placed Bilateral lower extremity.   Plan:   Follow up in one week.  

## 2021-08-01 ENCOUNTER — Ambulatory Visit (INDEPENDENT_AMBULATORY_CARE_PROVIDER_SITE_OTHER): Payer: Medicare Other | Admitting: Nurse Practitioner

## 2021-08-01 ENCOUNTER — Encounter (INDEPENDENT_AMBULATORY_CARE_PROVIDER_SITE_OTHER): Payer: Self-pay | Admitting: Nurse Practitioner

## 2021-08-01 VITALS — BP 154/91 | HR 62 | Resp 16 | Wt 288.0 lb

## 2021-08-01 DIAGNOSIS — L97212 Non-pressure chronic ulcer of right calf with fat layer exposed: Secondary | ICD-10-CM | POA: Diagnosis not present

## 2021-08-01 NOTE — Progress Notes (Signed)
History of Present Illness  There is no documented history at this time  Assessments & Plan   There are no diagnoses linked to this encounter.    Additional instructions  Subjective:  Patient presents with venous ulcer of the Bilateral lower extremity.    Procedure:  3 layer unna wrap was placed Bilateral lower extremity.   Plan:   Follow up in one week.  

## 2021-08-09 ENCOUNTER — Encounter (INDEPENDENT_AMBULATORY_CARE_PROVIDER_SITE_OTHER): Payer: Self-pay | Admitting: Nurse Practitioner

## 2021-08-09 ENCOUNTER — Ambulatory Visit (INDEPENDENT_AMBULATORY_CARE_PROVIDER_SITE_OTHER): Payer: Medicare Other | Admitting: Nurse Practitioner

## 2021-08-09 VITALS — BP 137/82 | HR 65 | Resp 16 | Wt 287.0 lb

## 2021-08-09 DIAGNOSIS — L97212 Non-pressure chronic ulcer of right calf with fat layer exposed: Secondary | ICD-10-CM | POA: Diagnosis not present

## 2021-08-09 NOTE — Progress Notes (Signed)
History of Present Illness  There is no documented history at this time  Assessments & Plan   There are no diagnoses linked to this encounter.    Additional instructions  Subjective:  Patient presents with venous ulcer of the Bilateral lower extremity.    Procedure:  3 layer unna wrap was placed Bilateral lower extremity.   Plan:   Follow up in one week.  

## 2021-08-12 ENCOUNTER — Encounter (INDEPENDENT_AMBULATORY_CARE_PROVIDER_SITE_OTHER): Payer: Self-pay | Admitting: Nurse Practitioner

## 2021-08-15 ENCOUNTER — Encounter (INDEPENDENT_AMBULATORY_CARE_PROVIDER_SITE_OTHER): Payer: Self-pay | Admitting: Vascular Surgery

## 2021-08-15 ENCOUNTER — Ambulatory Visit (INDEPENDENT_AMBULATORY_CARE_PROVIDER_SITE_OTHER): Payer: Medicare Other | Admitting: Nurse Practitioner

## 2021-08-15 VITALS — BP 152/82 | HR 67 | Resp 16 | Wt 287.8 lb

## 2021-08-15 DIAGNOSIS — L97212 Non-pressure chronic ulcer of right calf with fat layer exposed: Secondary | ICD-10-CM | POA: Diagnosis not present

## 2021-08-15 NOTE — Progress Notes (Unsigned)
History of Present Illness  There is no documented history at this time  Assessments & Plan   There are no diagnoses linked to this encounter.    Additional instructions  Subjective:  Patient presents with venous ulcer of the Bilateral lower extremity.    Procedure:  3 layer unna wrap was placed Bilateral lower extremity.   Plan:   Follow up in one week.  

## 2021-08-16 ENCOUNTER — Encounter (INDEPENDENT_AMBULATORY_CARE_PROVIDER_SITE_OTHER): Payer: Self-pay | Admitting: Nurse Practitioner

## 2021-08-22 ENCOUNTER — Encounter (INDEPENDENT_AMBULATORY_CARE_PROVIDER_SITE_OTHER): Payer: Self-pay | Admitting: Nurse Practitioner

## 2021-08-22 ENCOUNTER — Encounter (INDEPENDENT_AMBULATORY_CARE_PROVIDER_SITE_OTHER): Payer: Medicare Other

## 2021-08-22 ENCOUNTER — Ambulatory Visit (INDEPENDENT_AMBULATORY_CARE_PROVIDER_SITE_OTHER): Payer: Medicare Other | Admitting: Nurse Practitioner

## 2021-08-22 VITALS — BP 132/89 | HR 67 | Resp 16 | Wt 287.0 lb

## 2021-08-22 DIAGNOSIS — L97212 Non-pressure chronic ulcer of right calf with fat layer exposed: Secondary | ICD-10-CM

## 2021-08-22 NOTE — Progress Notes (Signed)
History of Present Illness  There is no documented history at this time  Assessments & Plan   There are no diagnoses linked to this encounter.    Additional instructions  Subjective:  Patient presents with venous ulcer of the Bilateral lower extremity.    Procedure:  3 layer unna wrap was placed Bilateral lower extremity.   Plan:   Follow up in one week.  

## 2021-08-29 ENCOUNTER — Ambulatory Visit (INDEPENDENT_AMBULATORY_CARE_PROVIDER_SITE_OTHER): Payer: Medicare Other | Admitting: Nurse Practitioner

## 2021-08-29 ENCOUNTER — Encounter (INDEPENDENT_AMBULATORY_CARE_PROVIDER_SITE_OTHER): Payer: Medicare Other

## 2021-08-29 ENCOUNTER — Encounter (INDEPENDENT_AMBULATORY_CARE_PROVIDER_SITE_OTHER): Payer: Self-pay

## 2021-08-29 VITALS — BP 133/82 | HR 88 | Resp 16

## 2021-08-29 DIAGNOSIS — L97212 Non-pressure chronic ulcer of right calf with fat layer exposed: Secondary | ICD-10-CM

## 2021-08-29 NOTE — Progress Notes (Signed)
History of Present Illness  There is no documented history at this time  Assessments & Plan   There are no diagnoses linked to this encounter.    Additional instructions  Subjective:  Patient presents with venous ulcer of the Bilateral lower extremity.    Procedure:  3 layer unna wrap was placed Bilateral lower extremity.   Plan:   Follow up in one week.  

## 2021-09-05 ENCOUNTER — Encounter (INDEPENDENT_AMBULATORY_CARE_PROVIDER_SITE_OTHER): Payer: Self-pay | Admitting: Vascular Surgery

## 2021-09-05 ENCOUNTER — Ambulatory Visit (INDEPENDENT_AMBULATORY_CARE_PROVIDER_SITE_OTHER): Payer: Medicare Other | Admitting: Vascular Surgery

## 2021-09-05 VITALS — BP 129/82 | HR 67 | Resp 16 | Wt 289.0 lb

## 2021-09-05 DIAGNOSIS — I890 Lymphedema, not elsewhere classified: Secondary | ICD-10-CM

## 2021-09-05 DIAGNOSIS — M7989 Other specified soft tissue disorders: Secondary | ICD-10-CM

## 2021-09-05 DIAGNOSIS — I10 Essential (primary) hypertension: Secondary | ICD-10-CM | POA: Diagnosis not present

## 2021-09-05 NOTE — Assessment & Plan Note (Signed)
His lower extremity symptoms are roughly stable.  His left leg remains the more severely affected of the 2 legs.  Continue weekly Unna boots to keep the swelling under control and the skin healed.  See him back in 4 to 5 weeks.

## 2021-09-05 NOTE — Assessment & Plan Note (Signed)
stable °

## 2021-09-05 NOTE — Progress Notes (Signed)
[name] : [id number]  Warren Leblanc is a 76 y.o. (02-08-1945) male who presents with chief complaint of  Chief Complaint  Patient presents with   Follow-up    Unna wrap follow up  .  History of Present Illness: Patient returns today in follow up of his lymphedema and leg swelling.  He had to have a skin lesion removed from his scalp this seems to be healing well.  His legs probably did swell a little bit more in the perioperative period but they are back to baseline at this point.  No fevers or chills.  No open wounds or infection.  Current Outpatient Medications  Medication Sig Dispense Refill   ACIDOPHILUS LACTOBACILLUS PO Take by mouth.     Ascorbic Acid (VITAMIN C) 1000 MG tablet Take by mouth.     Biotin 2.5 MG TABS Take by mouth.     Calcium Ascorbate 500 MG TABS Take by mouth.     Cyanocobalamin (VITAMIN B 12 PO) Take by mouth.     Ergocalciferol 2000 units TABS Take by mouth.     folic acid (FOLVITE) 1 MG tablet Take by mouth.     Ginger Oil OIL by Does not apply route daily.     lisinopril-hydrochlorothiazide (ZESTORETIC) 20-12.5 MG tablet Take 2 tablets by mouth daily.      Magnesium 100 MG CAPS Take by mouth.     MAGNESIUM OXIDE PO Take by mouth.     Nadide POWD Take one capsule by mouth twice daily.     Niacinamide-Zn-Cu-Methfo-Se-Cr (NICOTINAMIDE PO) Take 500 mg by mouth in the morning and at bedtime.     Potassium 99 MG TABS Take by mouth.     Taurine 1000 MG CAPS Take by mouth.     TURMERIC PO Take by mouth.     UNABLE TO FIND Carnivora     amoxicillin (AMOXIL) 500 MG capsule amoxicillin 500 mg capsule (Patient not taking: Reported on 04/25/2021)     aspirin EC 81 MG tablet Take 81 mg by mouth daily. (Patient not taking: Reported on 02/14/2021)     furosemide (LASIX) 40 MG tablet Take by mouth as needed.  (Patient not taking: Reported on 02/14/2021)     No current facility-administered medications for this visit.    Past Medical History:  Diagnosis Date    Hypertension     No past surgical history on file.   Social History   Tobacco Use   Smoking status: Some Days   Smokeless tobacco: Never  Substance Use Topics   Alcohol use: No   Drug use: No      No family history on file.   Allergies  Allergen Reactions   Amoxicillin    Amoxicillin-Pot Clavulanate    Antihistamines, Chlorpheniramine-Type Nausea Only   Diphenhydramine Hcl     Other reaction(s): Other (See Comments) drowsy    REVIEW OF SYSTEMS (Negative unless checked)   Constitutional: [] Weight loss  [] Fever  [] Chills Cardiac: [] Chest pain   [] Chest pressure   [] Palpitations   [] Shortness of breath when laying flat   [] Shortness of breath at rest   [] Shortness of breath with exertion. Vascular:  [x] Pain in legs with walking   [x] Pain in legs at rest   [] Pain in legs when laying flat   [] Claudication   [] Pain in feet when walking  [] Pain in feet at rest  [] Pain in feet when laying flat   [] History of DVT   [] Phlebitis   [x] Swelling in legs   []   Varicose veins   [] Non-healing ulcers Pulmonary:   [] Uses home oxygen   [] Productive cough   [] Hemoptysis   [] Wheeze  [] COPD   [] Asthma Neurologic:  [] Dizziness  [] Blackouts   [] Seizures   [] History of stroke   [] History of TIA  [] Aphasia   [] Temporary blindness   [] Dysphagia   [] Weakness or numbness in arms   [] Weakness or numbness in legs Musculoskeletal:  [x] Arthritis   [] Joint swelling   [x] Joint pain   [] Low back pain Hematologic:  [] Easy bruising  [] Easy bleeding   [] Hypercoagulable state   [] Anemic   Gastrointestinal:  [] Blood in stool   [] Vomiting blood  [] Gastroesophageal reflux/heartburn   [] Abdominal pain Genitourinary:  [] Chronic kidney disease   [] Difficult urination  [] Frequent urination  [] Burning with urination   [] Hematuria Skin:  [x] Rashes   [] Ulcers   [] Wounds Psychological:  [] History of anxiety   []  History of major depression  Physical Examination  BP 129/82 (BP Location: Left Arm)   Pulse 67   Resp 16    Wt 289 lb (131.1 kg)   BMI 39.20 kg/m  Gen:  WD/WN, NAD Head: [place]/AT, No temporalis wasting. Ear/Nose/Throat: Hearing grossly intact, nares w/o erythema or drainage Eyes: Conjunctiva clear. Sclera non-icteric Neck: Supple.  Trachea midline Pulmonary:  Good air movement, no use of accessory muscles.  Cardiac: RRR, no JVD Vascular:  Vessel Right Left  Radial Palpable Palpable               Musculoskeletal: M/S 5/5 throughout.  No deformity or atrophy.  2+ right lower extremity edema, 3+ left lower extremity edema. Neurologic: Sensation grossly intact in extremities.  Symmetrical.  Speech is fluent.  Psychiatric: Judgment intact, Mood & affect appropriate for pt's clinical situation. Dermatologic: No rashes or ulcers noted.  No cellulitis or open wounds.     Labs No results found for this or any previous visit (from the past 2160 hour(s)).  Radiology No results found.  Assessment/Plan Essential hypertension, benign blood pressure control important in reducing the progression of atherosclerotic disease. On appropriate oral medications.  No problem-specific Assessment & Plan notes found for this encounter.    , MD  09/05/2021 10:49 AM    This note was created with Dragon medical transcription system.  Any errors from dictation are purely unintentional

## 2021-09-12 ENCOUNTER — Encounter (INDEPENDENT_AMBULATORY_CARE_PROVIDER_SITE_OTHER): Payer: Self-pay | Admitting: Nurse Practitioner

## 2021-09-12 ENCOUNTER — Ambulatory Visit (INDEPENDENT_AMBULATORY_CARE_PROVIDER_SITE_OTHER): Payer: Medicare Other | Admitting: Nurse Practitioner

## 2021-09-12 VITALS — BP 144/87 | HR 69 | Resp 16 | Wt 293.0 lb

## 2021-09-12 DIAGNOSIS — L97212 Non-pressure chronic ulcer of right calf with fat layer exposed: Secondary | ICD-10-CM

## 2021-09-12 NOTE — Progress Notes (Signed)
History of Present Illness  There is no documented history at this time  Assessments & Plan   There are no diagnoses linked to this encounter.    Additional instructions  Subjective:  Patient presents with venous ulcer of the Bilateral lower extremity.    Procedure:  3 layer unna wrap was placed Bilateral lower extremity.   Plan:   Follow up in one week.  

## 2021-09-19 ENCOUNTER — Ambulatory Visit (INDEPENDENT_AMBULATORY_CARE_PROVIDER_SITE_OTHER): Payer: Medicare Other | Admitting: Nurse Practitioner

## 2021-09-19 ENCOUNTER — Encounter (INDEPENDENT_AMBULATORY_CARE_PROVIDER_SITE_OTHER): Payer: Self-pay | Admitting: Nurse Practitioner

## 2021-09-19 VITALS — BP 143/92 | HR 62 | Resp 16 | Wt 296.0 lb

## 2021-09-19 DIAGNOSIS — L97212 Non-pressure chronic ulcer of right calf with fat layer exposed: Secondary | ICD-10-CM | POA: Diagnosis not present

## 2021-09-19 NOTE — Progress Notes (Unsigned)
History of Present Illness  There is no documented history at this time  Assessments & Plan   There are no diagnoses linked to this encounter.    Additional instructions  Subjective:  Patient presents with venous ulcer of the Bilateral lower extremity.    Procedure:  3 layer unna wrap was placed Bilateral lower extremity.   Plan:   Follow up in one week.  

## 2021-09-20 ENCOUNTER — Encounter (INDEPENDENT_AMBULATORY_CARE_PROVIDER_SITE_OTHER): Payer: Self-pay | Admitting: Nurse Practitioner

## 2021-09-26 ENCOUNTER — Encounter (INDEPENDENT_AMBULATORY_CARE_PROVIDER_SITE_OTHER): Payer: Self-pay

## 2021-09-26 ENCOUNTER — Ambulatory Visit (INDEPENDENT_AMBULATORY_CARE_PROVIDER_SITE_OTHER): Payer: Medicare Other | Admitting: Nurse Practitioner

## 2021-09-26 VITALS — BP 144/87 | HR 82 | Resp 16 | Wt 291.0 lb

## 2021-09-26 DIAGNOSIS — L97212 Non-pressure chronic ulcer of right calf with fat layer exposed: Secondary | ICD-10-CM

## 2021-09-26 NOTE — Progress Notes (Signed)
History of Present Illness  There is no documented history at this time  Assessments & Plan   There are no diagnoses linked to this encounter.    Additional instructions  Subjective:  Patient presents with venous ulcer of the Bilateral lower extremity.    Procedure:  3 layer unna wrap was placed Bilateral lower extremity.   Plan:   Follow up in one week.  

## 2021-10-02 ENCOUNTER — Encounter (INDEPENDENT_AMBULATORY_CARE_PROVIDER_SITE_OTHER): Payer: Self-pay | Admitting: Nurse Practitioner

## 2021-10-03 ENCOUNTER — Encounter (INDEPENDENT_AMBULATORY_CARE_PROVIDER_SITE_OTHER): Payer: Self-pay

## 2021-10-03 ENCOUNTER — Ambulatory Visit (INDEPENDENT_AMBULATORY_CARE_PROVIDER_SITE_OTHER): Payer: Medicare Other | Admitting: Vascular Surgery

## 2021-10-03 VITALS — BP 147/86 | HR 66 | Resp 16 | Wt 289.0 lb

## 2021-10-03 DIAGNOSIS — I890 Lymphedema, not elsewhere classified: Secondary | ICD-10-CM | POA: Diagnosis not present

## 2021-10-03 NOTE — Progress Notes (Signed)
History of Present Illness  There is no documented history at this time  Assessments & Plan   There are no diagnoses linked to this encounter.    Additional instructions  Subjective:  Patient presents with venous ulcer of the Bilateral lower extremity.    Procedure:  3 layer unna wrap was placed Bilateral lower extremity.   Plan:   Follow up in one week.  

## 2021-10-10 ENCOUNTER — Ambulatory Visit (INDEPENDENT_AMBULATORY_CARE_PROVIDER_SITE_OTHER): Payer: Medicare Other | Admitting: Vascular Surgery

## 2021-10-10 ENCOUNTER — Encounter (INDEPENDENT_AMBULATORY_CARE_PROVIDER_SITE_OTHER): Payer: Self-pay | Admitting: Vascular Surgery

## 2021-10-10 VITALS — BP 127/84 | HR 89 | Resp 16 | Wt 290.0 lb

## 2021-10-10 DIAGNOSIS — L03115 Cellulitis of right lower limb: Secondary | ICD-10-CM | POA: Diagnosis not present

## 2021-10-10 DIAGNOSIS — I10 Essential (primary) hypertension: Secondary | ICD-10-CM

## 2021-10-10 DIAGNOSIS — I890 Lymphedema, not elsewhere classified: Secondary | ICD-10-CM | POA: Diagnosis not present

## 2021-10-10 DIAGNOSIS — M7989 Other specified soft tissue disorders: Secondary | ICD-10-CM

## 2021-10-10 NOTE — Progress Notes (Signed)
[name] : [id number]  Warren Leblanc is a 76 y.o. (July 23, 1945) male who presents with chief complaint of  Chief Complaint  Patient presents with   Follow-up    Unna wrap check  .  History of Present Illness: Patient returns today in follow up of his lymphedema and leg swelling.  It is a little better today. We have remained in [place] boots.  He is doing well otherwise.  No fevers or chills.  Current Outpatient Medications  Medication Sig Dispense Refill   ACIDOPHILUS LACTOBACILLUS PO Take by mouth.     Ascorbic Acid (VITAMIN C) 1000 MG tablet Take by mouth.     Biotin 2.5 MG TABS Take by mouth.     Calcium Ascorbate 500 MG TABS Take by mouth.     Cyanocobalamin (VITAMIN B 12 PO) Take by mouth.     Ergocalciferol 2000 units TABS Take by mouth.     folic acid (FOLVITE) 1 MG tablet Take by mouth.     Ginger Oil OIL by Does not apply route daily.     lisinopril-hydrochlorothiazide (ZESTORETIC) 20-12.5 MG tablet Take 2 tablets by mouth daily.      Magnesium 100 MG CAPS Take by mouth.     MAGNESIUM OXIDE PO Take by mouth.     Nadide POWD Take one capsule by mouth twice daily.     Niacinamide-Zn-Cu-Methfo-Se-Cr (NICOTINAMIDE PO) Take 500 mg by mouth in the morning and at bedtime.     Potassium 99 MG TABS Take by mouth.     Taurine 1000 MG CAPS Take by mouth.     TURMERIC PO Take by mouth.     UNABLE TO FIND Carnivora     amoxicillin (AMOXIL) 500 MG capsule amoxicillin 500 mg capsule (Patient not taking: Reported on 04/25/2021)     aspirin EC 81 MG tablet Take 81 mg by mouth daily. (Patient not taking: Reported on 02/14/2021)     furosemide (LASIX) 40 MG tablet Take by mouth as needed.  (Patient not taking: Reported on 02/14/2021)     No current facility-administered medications for this visit.    Past Medical History:  Diagnosis Date   Hypertension     No past surgical history on file.   Social History   Tobacco Use   Smoking status: Some Days   Smokeless tobacco: Never   Substance Use Topics   Alcohol use: No   Drug use: No       No family history on file.   Allergies  Allergen Reactions   Amoxicillin    Amoxicillin-Pot Clavulanate    Antihistamines, Chlorpheniramine-Type Nausea Only   Diphenhydramine Hcl     Other reaction(s): Other (See Comments) drowsy    REVIEW OF SYSTEMS (Negative unless checked)   Constitutional: [] Weight loss  [] Fever  [] Chills Cardiac: [] Chest pain   [] Chest pressure   [] Palpitations   [] Shortness of breath when laying flat   [] Shortness of breath at rest   [] Shortness of breath with exertion. Vascular:  [x] Pain in legs with walking   [x] Pain in legs at rest   [] Pain in legs when laying flat   [] Claudication   [] Pain in feet when walking  [] Pain in feet at rest  [] Pain in feet when laying flat   [] History of DVT   [] Phlebitis   [x] Swelling in legs   [] Varicose veins   [] Non-healing ulcers Pulmonary:   [] Uses home oxygen   [] Productive cough   [] Hemoptysis   [] Wheeze  [] COPD   []   Asthma Neurologic:  [] Dizziness  [] Blackouts   [] Seizures   [] History of stroke   [] History of TIA  [] Aphasia   [] Temporary blindness   [] Dysphagia   [] Weakness or numbness in arms   [] Weakness or numbness in legs Musculoskeletal:  [x] Arthritis   [] Joint swelling   [x] Joint pain   [] Low back pain Hematologic:  [] Easy bruising  [] Easy bleeding   [] Hypercoagulable state   [] Anemic   Gastrointestinal:  [] Blood in stool   [] Vomiting blood  [] Gastroesophageal reflux/heartburn   [] Abdominal pain Genitourinary:  [] Chronic kidney disease   [] Difficult urination  [] Frequent urination  [] Burning with urination   [] Hematuria Skin:  [x] Rashes   [] Ulcers   [] Wounds Psychological:  [] History of anxiety   []  History of major depression  Physical Examination  BP 127/84 (BP Location: Left Arm)   Pulse 89   Resp 16   Wt 290 lb (131.5 kg)   BMI 39.33 kg/m  Gen:  WD/WN, NAD Head: /AT, No temporalis wasting. Ear/Nose/Throat: Hearing grossly intact, nares w/o  erythema or drainage Eyes: Conjunctiva clear. Sclera non-icteric Neck: Supple.  Trachea midline Pulmonary:  Good air movement, no use of accessory muscles.  Cardiac: RRR, no JVD Vascular:  Vessel Right Left  Radial Palpable Palpable           Musculoskeletal: M/S 5/5 throughout.  No deformity or atrophy.  2+ bilateral lower extremity edema. Neurologic: Sensation grossly intact in extremities.  Symmetrical.  Speech is fluent.  Psychiatric: Judgment intact, Mood & affect appropriate for pt's clinical situation. Dermatologic: No rashes or ulcers noted.  No cellulitis or open wounds.      Labs No results found for this or any previous visit (from the past 2160 hour(s)).  Radiology No results found.  Assessment/Plan Essential hypertension, benign blood pressure control important in reducing the progression of atherosclerotic disease. On appropriate oral medications.     Cellulitis of leg, right Several months ago, had a course of amoxil, improved   Swelling of limb Stable, mild improvement.  Remains significant  Lymphedema Stable with slight improvement.  Continue current measures.  Return to clinic to see me in a month or so.  Weekly Unna boots.    , MD  10/10/2021 2:56 PM    This note was created with Dragon medical transcription system.  Any errors from dictation are purely unintentional

## 2021-10-10 NOTE — Assessment & Plan Note (Signed)
Stable with slight improvement.  Continue current measures.  Return to clinic to see me in a month or so.  Weekly Unna boots.

## 2021-10-17 ENCOUNTER — Encounter (INDEPENDENT_AMBULATORY_CARE_PROVIDER_SITE_OTHER): Payer: Self-pay | Admitting: Nurse Practitioner

## 2021-10-17 ENCOUNTER — Ambulatory Visit (INDEPENDENT_AMBULATORY_CARE_PROVIDER_SITE_OTHER): Payer: Medicare Other | Admitting: Nurse Practitioner

## 2021-10-17 VITALS — BP 134/85 | HR 62

## 2021-10-17 DIAGNOSIS — I890 Lymphedema, not elsewhere classified: Secondary | ICD-10-CM | POA: Diagnosis not present

## 2021-10-17 NOTE — Progress Notes (Signed)
History of Present Illness  There is no documented history at this time  Assessments & Plan   There are no diagnoses linked to this encounter.    Additional instructions  Subjective:  Patient presents with venous ulcer of the Bilateral lower extremity.    Procedure:  3 layer unna wrap was placed Bilateral lower extremity.   Plan:   Follow up in one week.  

## 2021-10-24 ENCOUNTER — Ambulatory Visit (INDEPENDENT_AMBULATORY_CARE_PROVIDER_SITE_OTHER): Payer: Medicare Other | Admitting: Nurse Practitioner

## 2021-10-24 ENCOUNTER — Encounter (INDEPENDENT_AMBULATORY_CARE_PROVIDER_SITE_OTHER): Payer: Self-pay

## 2021-10-24 VITALS — BP 126/75 | HR 60 | Resp 16 | Wt 289.0 lb

## 2021-10-24 DIAGNOSIS — L97212 Non-pressure chronic ulcer of right calf with fat layer exposed: Secondary | ICD-10-CM | POA: Diagnosis not present

## 2021-10-24 NOTE — Progress Notes (Unsigned)
History of Present Illness  There is no documented history at this time  Assessments & Plan   There are no diagnoses linked to this encounter.    Additional instructions  Subjective:  Patient presents with venous ulcer of the Bilateral lower extremity.    Procedure:  3 layer unna wrap was placed Bilateral lower extremity.   Plan:   Follow up in one week.  

## 2021-10-25 ENCOUNTER — Encounter (INDEPENDENT_AMBULATORY_CARE_PROVIDER_SITE_OTHER): Payer: Self-pay | Admitting: Nurse Practitioner

## 2021-10-31 ENCOUNTER — Ambulatory Visit (INDEPENDENT_AMBULATORY_CARE_PROVIDER_SITE_OTHER): Payer: Medicare Other | Admitting: Nurse Practitioner

## 2021-10-31 ENCOUNTER — Encounter (INDEPENDENT_AMBULATORY_CARE_PROVIDER_SITE_OTHER): Payer: Self-pay

## 2021-10-31 VITALS — BP 142/87 | HR 58 | Resp 16 | Wt 289.0 lb

## 2021-10-31 DIAGNOSIS — I890 Lymphedema, not elsewhere classified: Secondary | ICD-10-CM | POA: Diagnosis not present

## 2021-10-31 NOTE — Progress Notes (Signed)
History of Present Illness  There is no documented history at this time  Assessments & Plan   There are no diagnoses linked to this encounter.    Additional instructions  Subjective:  Patient presents with venous ulcer of the Bilateral lower extremity.    Procedure:  3 layer unna wrap was placed Bilateral lower extremity.   Plan:   Follow up in one week.  

## 2021-11-07 ENCOUNTER — Encounter (INDEPENDENT_AMBULATORY_CARE_PROVIDER_SITE_OTHER): Payer: Self-pay

## 2021-11-07 ENCOUNTER — Ambulatory Visit (INDEPENDENT_AMBULATORY_CARE_PROVIDER_SITE_OTHER): Payer: Medicare Other | Admitting: Nurse Practitioner

## 2021-11-07 VITALS — BP 137/85 | HR 70 | Resp 16

## 2021-11-07 DIAGNOSIS — I890 Lymphedema, not elsewhere classified: Secondary | ICD-10-CM

## 2021-11-07 NOTE — Progress Notes (Signed)
History of Present Illness  There is no documented history at this time  Assessments & Plan   There are no diagnoses linked to this encounter.    Additional instructions  Subjective:  Patient presents with venous ulcer of the Bilateral lower extremity.    Procedure:  3 layer unna wrap was placed Bilateral lower extremity.   Plan:   Follow up in one week.  

## 2021-11-14 ENCOUNTER — Ambulatory Visit (INDEPENDENT_AMBULATORY_CARE_PROVIDER_SITE_OTHER): Payer: Medicare Other | Admitting: Nurse Practitioner

## 2021-11-14 ENCOUNTER — Encounter (INDEPENDENT_AMBULATORY_CARE_PROVIDER_SITE_OTHER): Payer: Self-pay

## 2021-11-14 VITALS — BP 134/79 | HR 72 | Resp 16 | Wt 290.0 lb

## 2021-11-14 DIAGNOSIS — I890 Lymphedema, not elsewhere classified: Secondary | ICD-10-CM | POA: Diagnosis not present

## 2021-11-14 NOTE — Progress Notes (Unsigned)
History of Present Illness  There is no documented history at this time  Assessments & Plan   There are no diagnoses linked to this encounter.    Additional instructions  Subjective:  Patient presents with venous ulcer of the Bilateral lower extremity.    Procedure:  3 layer unna wrap was placed Bilateral lower extremity.   Plan:   Follow up in one week.  

## 2021-11-15 ENCOUNTER — Encounter (INDEPENDENT_AMBULATORY_CARE_PROVIDER_SITE_OTHER): Payer: Self-pay | Admitting: Nurse Practitioner

## 2021-11-21 ENCOUNTER — Ambulatory Visit (INDEPENDENT_AMBULATORY_CARE_PROVIDER_SITE_OTHER): Payer: Medicare Other | Admitting: Nurse Practitioner

## 2021-11-21 ENCOUNTER — Encounter (INDEPENDENT_AMBULATORY_CARE_PROVIDER_SITE_OTHER): Payer: Self-pay

## 2021-11-21 VITALS — BP 127/80 | HR 65 | Resp 17 | Ht 72.0 in | Wt 290.0 lb

## 2021-11-21 DIAGNOSIS — I890 Lymphedema, not elsewhere classified: Secondary | ICD-10-CM | POA: Diagnosis not present

## 2021-11-21 NOTE — Progress Notes (Signed)
History of Present Illness  There is no documented history at this time  Assessments & Plan   There are no diagnoses linked to this encounter.    Additional instructions  Subjective:  Patient presents with venous ulcer of the Bilateral lower extremity.    Procedure:  3 layer unna wrap was placed Bilateral lower extremity.   Plan:   Follow up in one week.  

## 2021-11-26 ENCOUNTER — Encounter (INDEPENDENT_AMBULATORY_CARE_PROVIDER_SITE_OTHER): Payer: Self-pay | Admitting: Nurse Practitioner

## 2021-11-28 ENCOUNTER — Encounter (INDEPENDENT_AMBULATORY_CARE_PROVIDER_SITE_OTHER): Payer: Self-pay | Admitting: Nurse Practitioner

## 2021-11-28 ENCOUNTER — Ambulatory Visit (INDEPENDENT_AMBULATORY_CARE_PROVIDER_SITE_OTHER): Payer: Medicare Other | Admitting: Nurse Practitioner

## 2021-11-28 VITALS — BP 143/86 | HR 63 | Resp 16 | Wt 291.0 lb

## 2021-11-28 DIAGNOSIS — L97212 Non-pressure chronic ulcer of right calf with fat layer exposed: Secondary | ICD-10-CM | POA: Diagnosis not present

## 2021-11-28 NOTE — Progress Notes (Signed)
History of Present Illness  There is no documented history at this time  Assessments & Plan   There are no diagnoses linked to this encounter.    Additional instructions  Subjective:  Patient presents with venous ulcer of the Bilateral lower extremity.    Procedure:  3 layer unna wrap was placed Bilateral lower extremity.   Plan:   Follow up in one week.  

## 2021-12-04 ENCOUNTER — Encounter (INDEPENDENT_AMBULATORY_CARE_PROVIDER_SITE_OTHER): Payer: Self-pay

## 2021-12-05 ENCOUNTER — Encounter (INDEPENDENT_AMBULATORY_CARE_PROVIDER_SITE_OTHER): Payer: Self-pay | Admitting: Vascular Surgery

## 2021-12-05 ENCOUNTER — Ambulatory Visit (INDEPENDENT_AMBULATORY_CARE_PROVIDER_SITE_OTHER): Payer: Medicare Other | Admitting: Vascular Surgery

## 2021-12-05 VITALS — BP 135/85 | HR 64 | Resp 16 | Wt 296.6 lb

## 2021-12-05 DIAGNOSIS — L03116 Cellulitis of left lower limb: Secondary | ICD-10-CM

## 2021-12-05 DIAGNOSIS — M7989 Other specified soft tissue disorders: Secondary | ICD-10-CM | POA: Diagnosis not present

## 2021-12-05 DIAGNOSIS — I10 Essential (primary) hypertension: Secondary | ICD-10-CM | POA: Diagnosis not present

## 2021-12-05 DIAGNOSIS — I890 Lymphedema, not elsewhere classified: Secondary | ICD-10-CM | POA: Diagnosis not present

## 2021-12-05 NOTE — Progress Notes (Signed)
[name] : [id number]  Warren Leblanc is a 76 y.o. (28-Feb-1945) male who presents with chief complaint of  Chief Complaint  Patient presents with   Follow-up    Unna follow up  .  History of Present Illness: Patient returns today in follow up of his leg swelling and lymphedema.  His left leg is a little bit worse than it was at his last visit but all in all things are roughly stable.  No major improvements or deterioration since I last saw him 6 to 7 weeks ago.  He has been getting weekly Unna boots.  No fevers or chills or signs of systemic infection.  Current Outpatient Medications  Medication Sig Dispense Refill   ACIDOPHILUS LACTOBACILLUS PO Take by mouth.     amoxicillin (AMOXIL) 500 MG capsule amoxicillin 500 mg capsule (Patient not taking: Reported on 04/25/2021)     Ascorbic Acid (VITAMIN C) 1000 MG tablet Take by mouth.     aspirin EC 81 MG tablet Take 81 mg by mouth daily. (Patient not taking: Reported on 02/14/2021)     Biotin 2.5 MG TABS Take by mouth.     Calcium Ascorbate 500 MG TABS Take by mouth.     Cyanocobalamin (VITAMIN B 12 PO) Take by mouth.     Ergocalciferol 2000 units TABS Take by mouth.     folic acid (FOLVITE) 1 MG tablet Take by mouth.     furosemide (LASIX) 40 MG tablet Take by mouth as needed.  (Patient not taking: Reported on 02/14/2021)     Ginger Oil OIL by Does not apply route daily.     lisinopril-hydrochlorothiazide (ZESTORETIC) 20-12.5 MG tablet Take 2 tablets by mouth daily.      Magnesium 100 MG CAPS Take by mouth.     MAGNESIUM OXIDE PO Take by mouth.     Nadide POWD Take one capsule by mouth twice daily.     Niacinamide-Zn-Cu-Methfo-Se-Cr (NICOTINAMIDE PO) Take 500 mg by mouth in the morning and at bedtime.     Potassium 99 MG TABS Take by mouth.     Taurine 1000 MG CAPS Take by mouth.     TURMERIC PO Take by mouth.     UNABLE TO FIND Carnivora     No current facility-administered medications for this visit.    Past Medical History:   Diagnosis Date   Hypertension     No past surgical history on file.   Social History   Tobacco Use   Smoking status: Some Days   Smokeless tobacco: Never  Substance Use Topics   Alcohol use: No   Drug use: No      No family history on file.   Allergies  Allergen Reactions   Amoxicillin    Amoxicillin-Pot Clavulanate    Antihistamines, Chlorpheniramine-Type Nausea Only   Diphenhydramine Hcl     Other reaction(s): Other (See Comments) drowsy    REVIEW OF SYSTEMS (Negative unless checked)   Constitutional: [] Weight loss  [] Fever  [] Chills Cardiac: [] Chest pain   [] Chest pressure   [] Palpitations   [] Shortness of breath when laying flat   [] Shortness of breath at rest   [] Shortness of breath with exertion. Vascular:  [x] Pain in legs with walking   [x] Pain in legs at rest   [] Pain in legs when laying flat   [] Claudication   [] Pain in feet when walking  [] Pain in feet at rest  [] Pain in feet when laying flat   [] History of DVT   [] Phlebitis   [  x]Swelling in legs   [] Varicose veins   [] Non-healing ulcers Pulmonary:   [] Uses home oxygen   [] Productive cough   [] Hemoptysis   [] Wheeze  [] COPD   [] Asthma Neurologic:  [] Dizziness  [] Blackouts   [] Seizures   [] History of stroke   [] History of TIA  [] Aphasia   [] Temporary blindness   [] Dysphagia   [] Weakness or numbness in arms   [] Weakness or numbness in legs Musculoskeletal:  [x] Arthritis   [] Joint swelling   [x] Joint pain   [] Low back pain Hematologic:  [] Easy bruising  [] Easy bleeding   [] Hypercoagulable state   [] Anemic   Gastrointestinal:  [] Blood in stool   [] Vomiting blood  [] Gastroesophageal reflux/heartburn   [] Abdominal pain Genitourinary:  [] Chronic kidney disease   [] Difficult urination  [] Frequent urination  [] Burning with urination   [] Hematuria Skin:  [x] Rashes   [] Ulcers   [] Wounds Psychological:  [] History of anxiety   []  History of major depression  Physical Examination  BP 135/85 (BP Location: Right Arm)    Pulse 64   Resp 16   Wt 296 lb 9.6 oz (134.5 kg)   BMI 40.23 kg/m  Gen:  WD/WN, NAD Head: [place]/AT, No temporalis wasting. Ear/Nose/Throat: Hearing grossly intact, nares w/o erythema or drainage Eyes: Conjunctiva clear. Sclera non-icteric Neck: Supple.  Trachea midline Pulmonary:  Good air movement, no use of accessory muscles.  Cardiac: RRR, no JVD Vascular:  Vessel Right Left  Radial Palpable Palpable           Musculoskeletal: M/S 5/5 throughout.  No deformity or atrophy.  2+ right lower extremity edema.  2-3+ left lower extremity edema. Neurologic: Sensation grossly intact in extremities.  Symmetrical.  Speech is fluent.  Psychiatric: Judgment intact, Mood & affect appropriate for pt's clinical situation. Dermatologic: No rashes or ulcers noted.  No cellulitis or open wounds.      Labs No results found for this or any previous visit (from the past 2160 hour(s)).  Radiology No results found.  Assessment/Plan Essential hypertension, benign blood pressure control important in reducing the progression of atherosclerotic disease. On appropriate oral medications.     Cellulitis of leg, right Several months ago, had a course of amoxil, improved   Swelling of limb Stable, mild improvement.  Remains significant   Lymphedema Stable with slight improvement.  Continue current measures.  Return to clinic to see me in a month or so.  Weekly Unna boots.   Leotis Pain, MD  12/05/2021 11:53 AM    This note was created with Dragon medical transcription system.  Any errors from dictation are purely unintentional

## 2021-12-19 ENCOUNTER — Ambulatory Visit (INDEPENDENT_AMBULATORY_CARE_PROVIDER_SITE_OTHER): Payer: Medicare Other | Admitting: Nurse Practitioner

## 2021-12-19 ENCOUNTER — Encounter (INDEPENDENT_AMBULATORY_CARE_PROVIDER_SITE_OTHER): Payer: Self-pay

## 2021-12-19 DIAGNOSIS — I890 Lymphedema, not elsewhere classified: Secondary | ICD-10-CM

## 2021-12-19 NOTE — Progress Notes (Signed)
History of Present Illness  There is no documented history at this time  Assessments & Plan   There are no diagnoses linked to this encounter.    Additional instructions  Subjective:  Patient presents with venous ulcer of the Bilateral lower extremity.    Procedure:  3 layer unna wrap was placed Bilateral lower extremity.   Plan:   Follow up in one week.  

## 2021-12-26 ENCOUNTER — Ambulatory Visit (INDEPENDENT_AMBULATORY_CARE_PROVIDER_SITE_OTHER): Payer: Medicare Other | Admitting: Nurse Practitioner

## 2021-12-26 ENCOUNTER — Encounter (INDEPENDENT_AMBULATORY_CARE_PROVIDER_SITE_OTHER): Payer: Self-pay | Admitting: Nurse Practitioner

## 2021-12-26 VITALS — BP 124/85 | HR 74 | Resp 16 | Wt 297.6 lb

## 2021-12-26 DIAGNOSIS — I890 Lymphedema, not elsewhere classified: Secondary | ICD-10-CM

## 2021-12-26 NOTE — Progress Notes (Signed)
History of Present Illness  There is no documented history at this time  Assessments & Plan   There are no diagnoses linked to this encounter.    Additional instructions  Subjective:  Patient presents with venous ulcer of the Bilateral lower extremity.    Procedure:  3 layer unna wrap was placed Bilateral lower extremity.   Plan:   Follow up in one week.  

## 2021-12-29 ENCOUNTER — Encounter (INDEPENDENT_AMBULATORY_CARE_PROVIDER_SITE_OTHER): Payer: Self-pay | Admitting: Nurse Practitioner

## 2021-12-31 ENCOUNTER — Encounter (INDEPENDENT_AMBULATORY_CARE_PROVIDER_SITE_OTHER): Payer: Self-pay | Admitting: Nurse Practitioner

## 2022-01-02 ENCOUNTER — Encounter (INDEPENDENT_AMBULATORY_CARE_PROVIDER_SITE_OTHER): Payer: Self-pay | Admitting: Nurse Practitioner

## 2022-01-02 ENCOUNTER — Ambulatory Visit (INDEPENDENT_AMBULATORY_CARE_PROVIDER_SITE_OTHER): Payer: Medicare Other | Admitting: Nurse Practitioner

## 2022-01-02 VITALS — BP 131/85 | HR 66 | Resp 16 | Wt 296.0 lb

## 2022-01-02 DIAGNOSIS — I890 Lymphedema, not elsewhere classified: Secondary | ICD-10-CM | POA: Diagnosis not present

## 2022-01-02 NOTE — Progress Notes (Signed)
History of Present Illness  There is no documented history at this time  Assessments & Plan   There are no diagnoses linked to this encounter.    Additional instructions  Subjective:  Patient presents with venous ulcer of the Bilateral lower extremity.    Procedure:  3 layer unna wrap was placed Bilateral lower extremity.   Plan:   Follow up in one week.  

## 2022-01-07 ENCOUNTER — Encounter (INDEPENDENT_AMBULATORY_CARE_PROVIDER_SITE_OTHER): Payer: Self-pay | Admitting: Nurse Practitioner

## 2022-01-09 ENCOUNTER — Ambulatory Visit (INDEPENDENT_AMBULATORY_CARE_PROVIDER_SITE_OTHER): Payer: Medicare Other | Admitting: Nurse Practitioner

## 2022-01-09 ENCOUNTER — Other Ambulatory Visit: Payer: Self-pay | Admitting: Orthopedic Surgery

## 2022-01-09 VITALS — BP 132/92 | HR 85 | Resp 16 | Ht 72.0 in | Wt 295.0 lb

## 2022-01-09 DIAGNOSIS — I890 Lymphedema, not elsewhere classified: Secondary | ICD-10-CM

## 2022-01-09 DIAGNOSIS — G8929 Other chronic pain: Secondary | ICD-10-CM

## 2022-01-09 NOTE — Progress Notes (Signed)
History of Present Illness  There is no documented history at this time  Assessments & Plan   There are no diagnoses linked to this encounter.    Additional instructions  Subjective:  Patient presents with venous ulcer of the Bilateral lower extremity.    Procedure:  3 layer unna wrap was placed Bilateral lower extremity.   Plan:   Follow up in one week.  

## 2022-01-14 ENCOUNTER — Encounter (INDEPENDENT_AMBULATORY_CARE_PROVIDER_SITE_OTHER): Payer: Self-pay | Admitting: Nurse Practitioner

## 2022-01-16 ENCOUNTER — Encounter (INDEPENDENT_AMBULATORY_CARE_PROVIDER_SITE_OTHER): Payer: Self-pay | Admitting: Nurse Practitioner

## 2022-01-16 ENCOUNTER — Ambulatory Visit (INDEPENDENT_AMBULATORY_CARE_PROVIDER_SITE_OTHER): Payer: Medicare Other | Admitting: Nurse Practitioner

## 2022-01-16 VITALS — BP 122/84 | HR 77 | Resp 16 | Wt 295.8 lb

## 2022-01-16 DIAGNOSIS — I890 Lymphedema, not elsewhere classified: Secondary | ICD-10-CM | POA: Diagnosis not present

## 2022-01-16 NOTE — Progress Notes (Signed)
History of Present Illness  There is no documented history at this time  Assessments & Plan   There are no diagnoses linked to this encounter.    Additional instructions  Subjective:  Patient presents with venous ulcer of the Bilateral lower extremity.    Procedure:  3 layer unna wrap was placed Bilateral lower extremity.   Plan:   Follow up in one week.  

## 2022-01-23 ENCOUNTER — Encounter (INDEPENDENT_AMBULATORY_CARE_PROVIDER_SITE_OTHER): Payer: Self-pay | Admitting: Nurse Practitioner

## 2022-01-23 ENCOUNTER — Encounter (INDEPENDENT_AMBULATORY_CARE_PROVIDER_SITE_OTHER): Payer: Self-pay | Admitting: Vascular Surgery

## 2022-01-23 ENCOUNTER — Ambulatory Visit (INDEPENDENT_AMBULATORY_CARE_PROVIDER_SITE_OTHER): Payer: Medicare Other | Admitting: Vascular Surgery

## 2022-01-23 ENCOUNTER — Encounter (INDEPENDENT_AMBULATORY_CARE_PROVIDER_SITE_OTHER): Payer: Medicare Other

## 2022-01-23 VITALS — BP 131/87 | HR 82 | Resp 16 | Wt 296.0 lb

## 2022-01-23 DIAGNOSIS — I10 Essential (primary) hypertension: Secondary | ICD-10-CM

## 2022-01-23 DIAGNOSIS — M7989 Other specified soft tissue disorders: Secondary | ICD-10-CM

## 2022-01-23 DIAGNOSIS — I890 Lymphedema, not elsewhere classified: Secondary | ICD-10-CM

## 2022-01-23 DIAGNOSIS — L03116 Cellulitis of left lower limb: Secondary | ICD-10-CM | POA: Diagnosis not present

## 2022-01-23 NOTE — Progress Notes (Signed)
[name] : [id number]  Warren Leblanc is a 76 y.o. (1945-06-12) male who presents with chief complaint of  Chief Complaint  Patient presents with   Follow-up    Unna boot check  .  History of Present Illness: Patient returns today in follow up of lymphedema and leg swelling.  He continues weekly Unna boots and this seems to be working reasonably well and keeping his swelling under control.  There is been slight improvement in the right leg and not much change on the left leg from the last time I saw him 6 or 7 weeks ago.  No new ulceration or infection.  No fevers or chills.  Current Outpatient Medications  Medication Sig Dispense Refill   ACIDOPHILUS LACTOBACILLUS PO Take by mouth.     Ascorbic Acid (VITAMIN C) 1000 MG tablet Take by mouth.     Biotin 2.5 MG TABS Take by mouth.     Calcium Ascorbate 500 MG TABS Take by mouth.     Cyanocobalamin (VITAMIN B 12 PO) Take by mouth.     Ergocalciferol 2000 units TABS Take by mouth.     folic acid (FOLVITE) 1 MG tablet Take by mouth.     furosemide (LASIX) 40 MG tablet Take by mouth as needed.     Ginger Oil OIL by Does not apply route daily.     lisinopril-hydrochlorothiazide (ZESTORETIC) 20-12.5 MG tablet Take 2 tablets by mouth daily.      Magnesium 100 MG CAPS Take by mouth.     MAGNESIUM OXIDE PO Take by mouth.     Nadide POWD Take one capsule by mouth twice daily.     Niacinamide-Zn-Cu-Methfo-Se-Cr (NICOTINAMIDE PO) Take 500 mg by mouth in the morning and at bedtime.     Potassium 99 MG TABS Take by mouth.     Taurine 1000 MG CAPS Take by mouth.     TURMERIC PO Take by mouth.     UNABLE TO FIND Carnivora     amoxicillin (AMOXIL) 500 MG capsule amoxicillin 500 mg capsule (Patient not taking: Reported on 04/25/2021)     aspirin EC 81 MG tablet Take 81 mg by mouth daily. (Patient not taking: Reported on 02/14/2021)     No current facility-administered medications for this visit.    Past Medical History:  Diagnosis Date    Hypertension     No past surgical history on file.   Social History   Tobacco Use   Smoking status: Some Days   Smokeless tobacco: Never  Substance Use Topics   Alcohol use: No   Drug use: No      No family history on file.   Allergies  Allergen Reactions   Amoxicillin    Amoxicillin-Pot Clavulanate    Antihistamines, Chlorpheniramine-Type Nausea Only   Diphenhydramine Hcl     Other reaction(s): Other (See Comments) drowsy     REVIEW OF SYSTEMS (Negative unless checked)   Constitutional: [] Weight loss  [] Fever  [] Chills Cardiac: [] Chest pain   [] Chest pressure   [] Palpitations   [] Shortness of breath when laying flat   [] Shortness of breath at rest   [] Shortness of breath with exertion. Vascular:  [x] Pain in legs with walking   [x] Pain in legs at rest   [] Pain in legs when laying flat   [] Claudication   [] Pain in feet when walking  [] Pain in feet at rest  [] Pain in feet when laying flat   [] History of DVT   [] Phlebitis   [x] Swelling in  legs   [] Varicose veins   [] Non-healing ulcers Pulmonary:   [] Uses home oxygen   [] Productive cough   [] Hemoptysis   [] Wheeze  [] COPD   [] Asthma Neurologic:  [] Dizziness  [] Blackouts   [] Seizures   [] History of stroke   [] History of TIA  [] Aphasia   [] Temporary blindness   [] Dysphagia   [] Weakness or numbness in arms   [] Weakness or numbness in legs Musculoskeletal:  [x] Arthritis   [] Joint swelling   [x] Joint pain   [] Low back pain Hematologic:  [] Easy bruising  [] Easy bleeding   [] Hypercoagulable state   [] Anemic   Gastrointestinal:  [] Blood in stool   [] Vomiting blood  [] Gastroesophageal reflux/heartburn   [] Abdominal pain Genitourinary:  [] Chronic kidney disease   [] Difficult urination  [] Frequent urination  [] Burning with urination   [] Hematuria Skin:  [x] Rashes   [] Ulcers   [] Wounds Psychological:  [] History of anxiety   []  History of major depression  Physical Examination  BP 131/87 (BP Location: Right Arm)   Pulse 82   Resp 16    Wt 296 lb (134.3 kg)   BMI 40.14 kg/m  Gen:  WD/WN, NAD Head: [place]/AT, No temporalis wasting. Ear/Nose/Throat: Hearing grossly intact, nares w/o erythema or drainage Eyes: Conjunctiva clear. Sclera non-icteric Neck: Supple.  Trachea midline Pulmonary:  Good air movement, no use of accessory muscles.  Cardiac: RRR, no JVD Vascular:  Vessel Right Left  Radial Palpable Palpable                       Musculoskeletal: M/S 5/5 throughout.  No deformity or atrophy. 1-2+ RLE edema, 2-3+ LLE edema. Neurologic: Sensation grossly intact in extremities.  Symmetrical.  Speech is fluent.  Psychiatric: Judgment intact, Mood & affect appropriate for pt's clinical situation. Dermatologic: No rashes or ulcers noted.  No cellulitis or open wounds.      Labs No results found for this or any previous visit (from the past 2160 hour(s)).  Radiology No results found.  Assessment/Plan Essential hypertension, benign blood pressure control important in reducing the progression of atherosclerotic disease. On appropriate oral medications.     Cellulitis of leg, right Several months ago, had a course of amoxil, improved   Swelling of limb Stable, mild improvement.  Remains significant   Lymphedema Stable with slight improvement.  Continue current measures.  Return to clinic to see me in a month or so.  Weekly Unna boots. A three layer UNNA boot was placed on both LE today.    , MD  01/23/2022 3:33 PM    This note was created with Dragon medical transcription system.  Any errors from dictation are purely unintentional

## 2022-02-01 ENCOUNTER — Encounter (INDEPENDENT_AMBULATORY_CARE_PROVIDER_SITE_OTHER): Payer: Self-pay | Admitting: Nurse Practitioner

## 2022-02-01 ENCOUNTER — Ambulatory Visit (INDEPENDENT_AMBULATORY_CARE_PROVIDER_SITE_OTHER): Payer: Medicare Other | Admitting: Nurse Practitioner

## 2022-02-01 VITALS — BP 123/81 | HR 69 | Resp 16 | Wt 295.0 lb

## 2022-02-01 DIAGNOSIS — I890 Lymphedema, not elsewhere classified: Secondary | ICD-10-CM

## 2022-02-01 NOTE — Progress Notes (Signed)
History of Present Illness  There is no documented history at this time  Assessments & Plan   There are no diagnoses linked to this encounter.    Additional instructions  Subjective:  Patient presents with venous ulcer of the Bilateral lower extremity.    Procedure:  3 layer unna wrap was placed Bilateral lower extremity.   Plan:   Follow up in one week.  

## 2022-02-06 ENCOUNTER — Ambulatory Visit (INDEPENDENT_AMBULATORY_CARE_PROVIDER_SITE_OTHER): Payer: Medicare Other | Admitting: Nurse Practitioner

## 2022-02-06 ENCOUNTER — Encounter (INDEPENDENT_AMBULATORY_CARE_PROVIDER_SITE_OTHER): Payer: Self-pay

## 2022-02-06 VITALS — BP 132/88 | HR 87 | Resp 16 | Wt 295.8 lb

## 2022-02-06 DIAGNOSIS — I890 Lymphedema, not elsewhere classified: Secondary | ICD-10-CM

## 2022-02-06 NOTE — Progress Notes (Signed)
History of Present Illness  There is no documented history at this time  Assessments & Plan   There are no diagnoses linked to this encounter.    Additional instructions  Subjective:  Patient presents with venous ulcer of the Bilateral lower extremity.    Procedure:  3 layer unna wrap was placed Bilateral lower extremity.   Plan:   Follow up in one week.  

## 2022-02-13 ENCOUNTER — Encounter (INDEPENDENT_AMBULATORY_CARE_PROVIDER_SITE_OTHER): Payer: Self-pay

## 2022-02-13 ENCOUNTER — Ambulatory Visit (INDEPENDENT_AMBULATORY_CARE_PROVIDER_SITE_OTHER): Payer: Medicare Other | Admitting: Nurse Practitioner

## 2022-02-13 VITALS — BP 126/86 | HR 76 | Resp 16 | Wt 295.0 lb

## 2022-02-13 DIAGNOSIS — I890 Lymphedema, not elsewhere classified: Secondary | ICD-10-CM | POA: Diagnosis not present

## 2022-02-18 ENCOUNTER — Encounter (INDEPENDENT_AMBULATORY_CARE_PROVIDER_SITE_OTHER): Payer: Self-pay | Admitting: Nurse Practitioner

## 2022-02-18 NOTE — Progress Notes (Signed)
History of Present Illness  There is no documented history at this time  Assessments & Plan   There are no diagnoses linked to this encounter.    Additional instructions  Subjective:  Patient presents with venous ulcer of the Bilateral lower extremity.    Procedure:  3 layer unna wrap was placed Bilateral lower extremity.   Plan:   Follow up in one week.  

## 2022-02-20 ENCOUNTER — Ambulatory Visit (INDEPENDENT_AMBULATORY_CARE_PROVIDER_SITE_OTHER): Payer: Medicare Other | Admitting: Nurse Practitioner

## 2022-02-20 ENCOUNTER — Encounter (INDEPENDENT_AMBULATORY_CARE_PROVIDER_SITE_OTHER): Payer: Self-pay

## 2022-02-20 VITALS — BP 128/85 | HR 74 | Resp 16 | Wt 296.0 lb

## 2022-02-20 DIAGNOSIS — I890 Lymphedema, not elsewhere classified: Secondary | ICD-10-CM | POA: Diagnosis not present

## 2022-02-20 NOTE — Progress Notes (Signed)
History of Present Illness  There is no documented history at this time  Assessments & Plan   There are no diagnoses linked to this encounter.    Additional instructions  Subjective:  Patient presents with venous ulcer of the Bilateral lower extremity.    Procedure:  3 layer unna wrap was placed Bilateral lower extremity.   Plan:   Follow up in one week.  

## 2022-02-27 ENCOUNTER — Ambulatory Visit (INDEPENDENT_AMBULATORY_CARE_PROVIDER_SITE_OTHER): Payer: Medicare Other | Admitting: Nurse Practitioner

## 2022-02-27 ENCOUNTER — Encounter (INDEPENDENT_AMBULATORY_CARE_PROVIDER_SITE_OTHER): Payer: Self-pay

## 2022-02-27 VITALS — BP 119/82 | HR 80 | Resp 16 | Wt 295.0 lb

## 2022-02-27 DIAGNOSIS — I890 Lymphedema, not elsewhere classified: Secondary | ICD-10-CM | POA: Diagnosis not present

## 2022-02-27 NOTE — Progress Notes (Signed)
History of Present Illness  There is no documented history at this time  Assessments & Plan   There are no diagnoses linked to this encounter.    Additional instructions  Subjective:  Patient presents with venous ulcer of the Bilateral lower extremity.    Procedure:  3 layer unna wrap was placed Bilateral lower extremity.   Plan:   Follow up in one week.  

## 2022-03-06 ENCOUNTER — Encounter (INDEPENDENT_AMBULATORY_CARE_PROVIDER_SITE_OTHER): Payer: Self-pay | Admitting: Vascular Surgery

## 2022-03-06 ENCOUNTER — Ambulatory Visit (INDEPENDENT_AMBULATORY_CARE_PROVIDER_SITE_OTHER): Payer: Medicare Other | Admitting: Vascular Surgery

## 2022-03-06 VITALS — BP 131/90 | HR 88 | Ht 72.0 in | Wt 295.0 lb

## 2022-03-06 DIAGNOSIS — M7989 Other specified soft tissue disorders: Secondary | ICD-10-CM | POA: Diagnosis not present

## 2022-03-06 DIAGNOSIS — L03115 Cellulitis of right lower limb: Secondary | ICD-10-CM

## 2022-03-06 DIAGNOSIS — I890 Lymphedema, not elsewhere classified: Secondary | ICD-10-CM | POA: Diagnosis not present

## 2022-03-06 DIAGNOSIS — I10 Essential (primary) hypertension: Secondary | ICD-10-CM

## 2022-03-06 NOTE — Progress Notes (Signed)
[name] : [id number]  Warren Leblanc is a 77 y.o. (03-19-1945) male who presents with chief complaint of  Chief Complaint  Patient presents with   Follow-up  .  History of Present Illness: Patient returns today in follow up of his longstanding lymphedema and leg swelling.  His feet are actually the least swollen I have seen them in years.  His leg swelling is better too.  He has been doing [place] boots now for a long time that seems to be the only thing that keeps the swelling under reasonable control.  His activity level is increased.  Overall his legs have lightened and are generally looking better.  Current Outpatient Medications  Medication Sig Dispense Refill   ACIDOPHILUS LACTOBACILLUS PO Take by mouth.     Ascorbic Acid (VITAMIN C) 1000 MG tablet Take by mouth.     Biotin 2.5 MG TABS Take by mouth.     Calcium Ascorbate 500 MG TABS Take by mouth.     Cyanocobalamin (VITAMIN B 12 PO) Take by mouth.     Ergocalciferol 2000 units TABS Take by mouth.     folic acid (FOLVITE) 1 MG tablet Take by mouth.     furosemide (LASIX) 40 MG tablet Take by mouth as needed.     Ginger Oil OIL by Does not apply route daily.     lisinopril-hydrochlorothiazide (ZESTORETIC) 20-12.5 MG tablet Take 2 tablets by mouth daily.      Magnesium 100 MG CAPS Take by mouth.     MAGNESIUM OXIDE PO Take by mouth.     Nadide POWD Take one capsule by mouth twice daily.     Niacinamide-Zn-Cu-Methfo-Se-Cr (NICOTINAMIDE PO) Take 500 mg by mouth in the morning and at bedtime.     Potassium 99 MG TABS Take by mouth.     Taurine 1000 MG CAPS Take by mouth.     TURMERIC PO Take by mouth.     UNABLE TO FIND Carnivora     No current facility-administered medications for this visit.    Past Medical History:  Diagnosis Date   Hypertension     No past surgical history on file.   Social History   Tobacco Use   Smoking status: Some Days   Smokeless tobacco: Never  Substance Use Topics   Alcohol use: No   Drug  use: No      No family history on file.   Allergies  Allergen Reactions   Amoxicillin    Amoxicillin-Pot Clavulanate    Antihistamines, Chlorpheniramine-Type Nausea Only   Diphenhydramine Hcl     Other reaction(s): Other (See Comments) drowsy    REVIEW OF SYSTEMS (Negative unless checked)   Constitutional: [] Weight loss  [] Fever  [] Chills Cardiac: [] Chest pain   [] Chest pressure   [] Palpitations   [] Shortness of breath when laying flat   [] Shortness of breath at rest   [] Shortness of breath with exertion. Vascular:  [x] Pain in legs with walking   [x] Pain in legs at rest   [] Pain in legs when laying flat   [] Claudication   [] Pain in feet when walking  [] Pain in feet at rest  [] Pain in feet when laying flat   [] History of DVT   [] Phlebitis   [x] Swelling in legs   [] Varicose veins   [] Non-healing ulcers Pulmonary:   [] Uses home oxygen   [] Productive cough   [] Hemoptysis   [] Wheeze  [] COPD   [] Asthma Neurologic:  [] Dizziness  [] Blackouts   [] Seizures   [] History of  stroke   [] History of TIA  [] Aphasia   [] Temporary blindness   [] Dysphagia   [] Weakness or numbness in arms   [] Weakness or numbness in legs Musculoskeletal:  [x] Arthritis   [] Joint swelling   [x] Joint pain   [] Low back pain Hematologic:  [] Easy bruising  [] Easy bleeding   [] Hypercoagulable state   [] Anemic   Gastrointestinal:  [] Blood in stool   [] Vomiting blood  [] Gastroesophageal reflux/heartburn   [] Abdominal pain Genitourinary:  [] Chronic kidney disease   [] Difficult urination  [] Frequent urination  [] Burning with urination   [] Hematuria Skin:  [x] Rashes   [] Ulcers   [] Wounds Psychological:  [] History of anxiety   []  History of major depression   Physical Examination  BP (!) 131/90   Pulse 88   Ht 6' (1.829 m)   Wt 295 lb (133.8 kg)   BMI 40.01 kg/m  Gen:  WD/WN, NAD Head: [place]/AT, No temporalis wasting. Ear/Nose/Throat: Hearing grossly intact, nares w/o erythema or drainage Eyes: Conjunctiva clear. Sclera  non-icteric Neck: Supple.  Trachea midline Pulmonary:  Good air movement, no use of accessory muscles.  Cardiac: RRR, no JVD Vascular:  Vessel Right Left  Radial Palpable Palpable                   Musculoskeletal: M/S 5/5 throughout.  No deformity or atrophy.  Moderate stasis dermatitis changes.  1-2+ bilateral lower extremity edema. Neurologic: Sensation grossly intact in extremities.  Symmetrical.  Speech is fluent.  Psychiatric: Judgment intact, Mood & affect appropriate for pt's clinical situation. Dermatologic: No rashes or ulcers noted.  No cellulitis or open wounds.      Labs No results found for this or any previous visit (from the past 2160 hour(s)).  Radiology No results found.  Assessment/Plan Essential hypertension, benign blood pressure control important in reducing the progression of atherosclerotic disease. On appropriate oral medications.     Cellulitis of leg, right Several months ago, had a course of amoxil, improved   Swelling of limb Stable, mild improvement.  Remains significant   Lymphedema Stable with slight improvement.  Continue current measures.  Return to clinic to see me in a month or so.  Weekly Unna boots. A three layer UNNA boot was placed on both LE today.    Leotis Pain, MD  03/06/2022 1:12 PM    This note was created with Dragon medical transcription system.  Any errors from dictation are purely unintentional

## 2022-03-13 ENCOUNTER — Ambulatory Visit (INDEPENDENT_AMBULATORY_CARE_PROVIDER_SITE_OTHER): Payer: Medicare Other | Admitting: Nurse Practitioner

## 2022-03-13 DIAGNOSIS — I890 Lymphedema, not elsewhere classified: Secondary | ICD-10-CM | POA: Diagnosis not present

## 2022-03-15 ENCOUNTER — Ambulatory Visit
Payer: Medicare Other | Attending: Student in an Organized Health Care Education/Training Program | Admitting: Student in an Organized Health Care Education/Training Program

## 2022-03-15 ENCOUNTER — Encounter: Payer: Self-pay | Admitting: Student in an Organized Health Care Education/Training Program

## 2022-03-15 VITALS — BP 130/85 | HR 76 | Temp 98.1°F | Ht 72.0 in | Wt 292.0 lb

## 2022-03-15 DIAGNOSIS — I890 Lymphedema, not elsewhere classified: Secondary | ICD-10-CM

## 2022-03-15 DIAGNOSIS — M19019 Primary osteoarthritis, unspecified shoulder: Secondary | ICD-10-CM | POA: Insufficient documentation

## 2022-03-15 DIAGNOSIS — M19012 Primary osteoarthritis, left shoulder: Secondary | ICD-10-CM | POA: Diagnosis present

## 2022-03-15 DIAGNOSIS — G5682 Other specified mononeuropathies of left upper limb: Secondary | ICD-10-CM

## 2022-03-15 DIAGNOSIS — M67814 Other specified disorders of tendon, left shoulder: Secondary | ICD-10-CM | POA: Diagnosis present

## 2022-03-15 DIAGNOSIS — S42292S Other displaced fracture of upper end of left humerus, sequela: Secondary | ICD-10-CM | POA: Insufficient documentation

## 2022-03-15 DIAGNOSIS — G894 Chronic pain syndrome: Secondary | ICD-10-CM | POA: Insufficient documentation

## 2022-03-15 NOTE — Progress Notes (Signed)
Patient: Warren Leblanc  Service Category: E/M  Provider: Gillis Santa, MD  DOB: [birth year]  DOS: 03/15/2022  Referring Provider: Leim Fabry, MD  [name]: [id number]  Setting: Ambulatory outpatient  PCP: Delma Freeze, MD  Type: New Patient  Specialty: Interventional Pain Management    Location: Office  Delivery: Face-to-face     Primary Reason(s) for Visit: Encounter for initial evaluation of one or more chronic problems (new to examiner) potentially causing chronic pain, and posing a threat to normal musculoskeletal function. (Level of risk: High) CC: Shoulder Pain (left)  HPI  Warren Leblanc is a 77 y.o. year old, male patient, who comes for the first time to our practice referred by Leim Fabry, MD for our initial evaluation of his chronic pain. He has Essential hypertension, benign; Swelling of limb; Lymphedema; Cellulitis of leg, left; Pain in limb; Rash and nonspecific skin eruption; Actinic keratosis; Candidal intertrigo; Weight gain; Abdominal swelling; Cellulitis of leg, right; Lower limb ulcer, calf, right, with fat layer exposed ([place]); Humeral head fracture, left, sequela; Arthritis of left glenohumeral joint; Tendinosis of left rotator cuff; AC joint arthropathy; and Chronic pain syndrome on their problem list. Today he comes in for evaluation of his Shoulder Pain (left)  Pain Assessment: Location: Left Shoulder Radiating: Pain radiaities down to his left arm Onset: More than a month ago Duration: Chronic pain Quality: Aching, Constant, Stabbing, Shooting, Sharp Severity: 3 /10 (subjective, self-reported pain score)  Effect on ADL: limits my daily activities Timing: Constant Modifying factors: sleep sitting up, not using his left shoulder BP: 130/85  HR: 76  Onset and Duration: Sudden and Gradual Cause of pain: Trauma Severity: Getting worse, NAS-11 at its worse: 10/10, NAS-11 at its best: 3/10, NAS-11 now: 3/10, and NAS-11 on the average: 5/10 Timing: Not influenced by the time of  the day, During activity or exercise, and After activity or exercise Aggravating Factors: Lifiting, Motion, and Twisting Alleviating Factors: Resting and Sleeping Associated Problems: Spasms, Tingling, and Pain that wakes patient up Quality of Pain: Intermittent, Deep, Sharp, Shooting, and Uncomfortable Previous Examinations or Tests: MRI scan and X-rays Previous Treatments: Physical Therapy, Strengthening exercises, and Stretching exercises  Warren Leblanc is being evaluated for possible interventional pain management therapies for the treatment of his chronic pain.  Warren Leblanc has persistent and severe left shoulder pain and limited range of motion.  He has a history of a proximal humerus fracture with nonunion.  He is not a candidate for reverse shoulder arthroplasty.  He has tried intra-articular glenohumeral joint injection with Dr. Posey Pronto which unfortunately was not helpful.  He is being referred here to consider a suprascapular nerve block. Meds   Current Outpatient Medications:    Ascorbic Acid (VITAMIN C) 1000 MG tablet, Take by mouth., Disp: , Rfl:    Biotin 2.5 MG TABS, Take by mouth., Disp: , Rfl:    Cyanocobalamin (VITAMIN B 12 PO), Take by mouth., Disp: , Rfl:    folic acid (FOLVITE) 1 MG tablet, Take by mouth., Disp: , Rfl:    lisinopril-hydrochlorothiazide (ZESTORETIC) 20-12.5 MG tablet, Take 2 tablets by mouth daily. , Disp: , Rfl:    MAGNESIUM OXIDE PO, Take by mouth., Disp: , Rfl:    Niacinamide-Zn-Cu-Methfo-Se-Cr (NICOTINAMIDE PO), Take 500 mg by mouth in the morning and at bedtime., Disp: , Rfl:    Potassium 99 MG TABS, Take by mouth., Disp: , Rfl:    TURMERIC PO, Take by mouth., Disp: , Rfl:    UNABLE TO FIND, [place], Disp: ,  Rfl:   Imaging Review  MR SHOULDER LEFT WO CONTRAST  Narrative CLINICAL DATA:  [place] 06/01/2020.  Humerus fracture.  EXAM: MRI OF THE LEFT SHOULDER WITHOUT CONTRAST  TECHNIQUE: Multiplanar, multisequence MR imaging of the shoulder was performed. No  intravenous contrast was administered.  COMPARISON:  Left shoulder radiographs 06/01/2020  FINDINGS: Rotator cuff: Mild intermediate T2 signal posterior supraspinatus and anterior infraspinatus tendinosis. Mild linear fluid bright signal along the longitudinal dimension of the superior subscapularis tendon (sagittal series 8 images 8 through 11), a small partial-thickness interstitial tear. The teres minor is intact.  Muscles: No rotator cuff muscle atrophy, fatty infiltration, or edema.  Biceps long head: The intra-articular long head of the biceps tendon is intact.  Acromioclavicular Joint: There are moderate degenerative changes of the acromioclavicular joint including joint space narrowing, subchondral marrow edema, and peripheral osteophytosis. Type II acromion. No subacromial/subdeltoid bursitis.  Glenohumeral Joint: Moderate thinning of the anterior glenoid and inferior medial humeral head cartilage. High-grade thinning of the anterior glenoid cartilage with the anterior inferior glenoid subchondral marrow edema. Possible nondisplaced anterior inferior glenoid fracture line (sagittal series 9, image 5).  Mild glenohumeral joint effusion with multiple tiny anterior inferior low signal foci, likely small loose bodies.  Labrum: Intermediate T2 signal and thickening degenerative irregularity of the anterior inferior glenoid labrum.  Bones: There are diffuse decreased T1 increased T2 signal fluid bright fracture lines corresponding to the previously seen markedly comminuted fracture involving the majority of the humeral head and extending into the anterior and lateral aspects of the surgical neck. There is up to 10 mm lateral cortical step-off of the inferolateral aspect of the humeral head (axial series 5, image 19). There is approximately 5 mm medial displacement of the distal fracture component (humeral shaft) with respect to the proximal fracture component (humeral  head). The anteromedial humeral head fracture lines extend to the anteromedial cartilage surface (axial images 15 through 18).  Other: None.  IMPRESSION:: IMPRESSION: 1. Markedly comminuted acute fracture of the humeral head and surgical neck with 5 mm medial displacement of the distal fracture component of the humeral shaft. 2. Moderate anterior inferior glenoid cartilage degenerative changes with subchondral marrow edema. Possible nondisplaced anterior inferior glenoid fracture line. 3. Mild posterior supraspinatus and anterior infraspinatus tendinosis. Tiny linear superior subscapularis longitudinal midsubstance tear without tendon retraction. 4. Moderate degenerative changes of the acromioclavicular joint.   Electronically Signed By: Yvonne Kendall M.D. On: 06/02/2021 10:35   Narrative CLINICAL DATA:  Tripped and fell on right shoulder. Initial encounter.  EXAM: RIGHT SHOULDER - 2+ VIEW  COMPARISON:  None.  FINDINGS: There is a comminuted, minimally displaced fracture of the proximal humerus involving the greater tuberosity and surgical neck. The humeral head remains approximated with the glenoid. Moderate acromioclavicular joint osteoarthrosis is noted. No lytic or blastic osseous lesion or focal soft tissue abnormality is seen.  IMPRESSION: Minimally displaced comminuted fracture of the proximal right humerus.   Electronically Signed By: Logan Bores M.D. On: 02/09/2015 15:11   Narrative CLINICAL DATA:  77 year old male with fall and trauma to the left shoulder.  EXAM: LEFT SHOULDER - 2+ VIEW  COMPARISON:  None.  FINDINGS: There is a comminuted and mildly displaced fracture of the lateral left humeral head and neck with involvement of the greater tuberosity. The bones are osteopenic. There is no dislocation. The soft tissues are unremarkable.  IMPRESSION: Mildly displaced comminuted fracture of the lateral left humeral head and  neck.   Electronically Signed By: Milas Hock  Radparvar M.D. On: 06/01/2020 15:29     Complexity Note: Imaging results reviewed.                         ROS  Cardiovascular: Heart murmur Pulmonary or Respiratory: No reported pulmonary signs or symptoms such as wheezing and difficulty taking a deep full breath (Asthma), difficulty blowing air out (Emphysema), coughing up mucus (Bronchitis), persistent dry cough, or temporary stoppage of breathing during sleep Neurological: No reported neurological signs or symptoms such as seizures, abnormal skin sensations, urinary and/or fecal incontinence, being born with an abnormal open spine and/or a tethered spinal cord Psychological-Psychiatric: No reported psychological or psychiatric signs or symptoms such as difficulty sleeping, anxiety, depression, delusions or hallucinations (schizophrenial), mood swings (bipolar disorders) or suicidal ideations or attempts Gastrointestinal: No reported gastrointestinal signs or symptoms such as vomiting or evacuating blood, reflux, heartburn, alternating episodes of diarrhea and constipation, inflamed or scarred liver, or pancreas or irrregular and/or infrequent bowel movements Genitourinary: No reported renal or genitourinary signs or symptoms such as difficulty voiding or producing urine, peeing blood, non-functioning kidney, kidney stones, difficulty emptying the bladder, difficulty controlling the flow of urine, or chronic kidney disease Hematological: No reported hematological signs or symptoms such as prolonged bleeding, low or poor functioning platelets, bruising or bleeding easily, hereditary bleeding problems, low energy levels due to low hemoglobin or being anemic Endocrine: No reported endocrine signs or symptoms such as high or low blood sugar, rapid heart rate due to high thyroid levels, obesity or weight gain due to slow thyroid or thyroid disease Rheumatologic: No reported rheumatological signs and  symptoms such as fatigue, joint pain, tenderness, swelling, redness, heat, stiffness, decreased range of motion, with or without associated rash Musculoskeletal: Negative for myasthenia gravis, muscular dystrophy, multiple sclerosis or malignant hyperthermia Work History: Retired  Allergies  Warren Leblanc is allergic to amoxicillin; amoxicillin-pot clavulanate; antihistamines, chlorpheniramine-type; and diphenhydramine hcl.  Laboratory Chemistry Profile   Renal No results found for: "BUN", "CREATININE", "LABCREA", "BCR", "GFR", "GFRAA", "GFRNONAA", "SPECGRAV", "PHUR", "PROTEINUR"   Electrolytes No results found for: "NA", "K", "CL", "CALCIUM", "MG", "PHOS"   Hepatic No results found for: "AST", "ALT", "ALBUMIN", "ALKPHOS", "AMYLASE", "LIPASE", "AMMONIA"   ID No results found for: "LYMEIGGIGMAB", "HIV", "SARSCOV2NAA", "STAPHAUREUS", "MRSAPCR", "HCVAB", "PREGTESTUR", "RMSFIGG", "QFVRPH1IGG", "QFVRPH2IGG"   Bone No results found for: "VD25OH", "VD125OH2TOT", "UE4540JW1", "XB1478GN5", "25OHVITD1", "25OHVITD2", "25OHVITD3", "TESTOFREE", "TESTOSTERONE"   Endocrine No results found for: "GLUCOSE", "GLUCOSEU", "HGBA1C", "TSH", "FREET4", "TESTOFREE", "TESTOSTERONE", "SHBG", "ESTRADIOL", "ESTRADIOLPCT", "ESTRADIOLFRE", "LABPREG", "ACTH", "CRTSLPL", "UCORFRPERLTR", "UCORFRPERDAY", "CORTISOLBASE"   Neuropathy No results found for: "VITAMINB12", "FOLATE", "HGBA1C", "HIV"   CNS No results found for: "COLORCSF", "APPEARCSF", "RBCCOUNTCSF", "WBCCSF", "POLYSCSF", "LYMPHSCSF", "EOSCSF", "PROTEINCSF", "GLUCCSF", "JCVIRUS", "CSFOLI", "IGGCSF", "LABACHR", "ACETBL"   Inflammation (CRP: Acute  ESR: Chronic) No results found for: "CRP", "ESRSEDRATE", "LATICACIDVEN"   Rheumatology No results found for: "RF", "ANA", "LABURIC", "URICUR", "LYMEIGGIGMAB", "LYMEABIGMQN", "HLAB27"   Coagulation No results found for: "INR", "LABPROT", "APTT", "PLT", "DDIMER", "LABHEMA", "VITAMINK1", "AT3"   Cardiovascular No  results found for: "BNP", "CKTOTAL", "CKMB", "TROPONINI", "HGB", "HCT", "LABVMA", "EPIRU", "EPINEPH24HUR", "NOREPRU", "NOREPI24HUR", "DOPARU", "DOPAM24HRUR"   Screening No results found for: "SARSCOV2NAA", "COVIDSOURCE", "STAPHAUREUS", "MRSAPCR", "HCVAB", "HIV", "PREGTESTUR"   Cancer No results found for: "CEA", "CA125", "LABCA2"   Allergens No results found for: "ALMOND", "APPLE", "ASPARAGUS", "AVOCADO", "BANANA", "BARLEY", "BASIL", "BAYLEAF", "GREENBEAN", "LIMABEAN", "WHITEBEAN", "BEEFIGE", "REDBEET", "BLUEBERRY", "BROCCOLI", "CABBAGE", "MELON", "CARROT", "CASEIN", "CASHEWNUT", "CAULIFLOWER", "CELERY"     Note: Lab results reviewed.  PFSH  Drug: Mr.  Leblanc  reports no history of drug use. Alcohol:  reports no history of alcohol use. Tobacco:  reports that he has been smoking. He has never used smokeless tobacco. Medical:  has a past medical history of Hypertension. Family: family history is not on file.  History reviewed. No pertinent surgical history. Active Ambulatory Problems    Diagnosis Date Noted   Essential hypertension, benign 02/17/2016   Swelling of limb 02/17/2016   Lymphedema 02/17/2016   Cellulitis of leg, left 05/01/2016   Pain in limb 10/15/2017   Rash and nonspecific skin eruption 04/22/2018   Actinic keratosis 04/03/2016   Candidal intertrigo 04/03/2016   Weight gain 04/03/2016   Abdominal swelling 10/28/2018   Cellulitis of leg, right 05/05/2019   Lower limb ulcer, calf, right, with fat layer exposed ([facility]) 11/17/2019   Humeral head fracture, left, sequela 03/15/2022   Arthritis of left glenohumeral joint 03/15/2022   Tendinosis of left rotator cuff 03/15/2022   AC joint arthropathy 03/15/2022   Chronic pain syndrome 03/15/2022   Resolved Ambulatory Problems    Diagnosis Date Noted   No Resolved Ambulatory Problems   Past Medical History:  Diagnosis Date   Hypertension    Constitutional Exam  General appearance: Well nourished, well developed, and  well hydrated. In no apparent acute distress Vitals:   03/15/22 1005  BP: 130/85  Pulse: 76  Temp: 98.1 F (36.7 C)  SpO2: 98%  Weight: 292 lb (132.5 kg)  Height: 6' (1.829 m)   BMI Assessment: Estimated body mass index is 39.6 kg/m as calculated from the following:   Height as of this encounter: 6' (1.829 m).   Weight as of this encounter: 292 lb (132.5 kg).  BMI interpretation table: BMI level Category Range association with higher incidence of chronic pain  <18 kg/m2 Underweight   18.5-24.9 kg/m2 Ideal body weight   25-29.9 kg/m2 Overweight Increased incidence by 20%  30-34.9 kg/m2 Obese (Class I) Increased incidence by 68%  35-39.9 kg/m2 Severe obesity (Class II) Increased incidence by 136%  >40 kg/m2 Extreme obesity (Class III) Increased incidence by 254%   Patient's current BMI Ideal Body weight  Body mass index is 39.6 kg/m. Ideal body weight: 77.6 kg (171 lb 1.2 oz) Adjusted ideal body weight: 99.5 kg (219 lb 7.1 oz)   BMI Readings from Last 4 Encounters:  03/15/22 39.60 kg/m  03/06/22 40.01 kg/m  02/27/22 40.01 kg/m  02/20/22 40.14 kg/m   Wt Readings from Last 4 Encounters:  03/15/22 292 lb (132.5 kg)  03/06/22 295 lb (133.8 kg)  02/27/22 295 lb (133.8 kg)  02/20/22 296 lb (134.3 kg)    Psych/Mental status: Alert, oriented x 3 (person, place, & time)       Eyes: PERLA Respiratory: No evidence of acute respiratory distress  Upper Extremity (UE) Exam    Side: Right upper extremity  Side: Left upper extremity  Skin & Extremity Inspection: Skin color, temperature, and hair growth are WNL. No peripheral edema or cyanosis. No masses, redness, swelling, asymmetry, or associated skin lesions. No contractures.  Skin & Extremity Inspection: Skin color, temperature, and hair growth are WNL. No peripheral edema or cyanosis. No masses, redness, swelling, asymmetry, or associated skin lesions. No contractures.  Functional ROM: Unrestricted ROM          Functional ROM:  Pain restricted ROM for shoulder  Muscle Tone/Strength: Functionally intact. No obvious neuro-muscular anomalies detected.  Muscle Tone/Strength: Functionally intact. No obvious neuro-muscular anomalies detected.  Sensory (Neurological): Unimpaired  Sensory (Neurological): Arthropathic arthralgia          Palpation: No palpable anomalies              Palpation: No palpable anomalies              Provocative Test(s):  Phalen's test: deferred Tinel's test: deferred Apley's scratch test (touch opposite shoulder):  Action 1 (Across chest): Adequate ROM Action 2 (Overhead): Adequate ROM Action 3 (LB reach): Adequate ROM   Provocative Test(s):  Phalen's test: deferred Tinel's test: deferred Apley's scratch test (touch opposite shoulder):  Action 1 (Across chest): Decreased ROM Action 2 (Overhead): Decreased ROM Action 3 (LB reach): Decreased ROM     Assessment  Primary Diagnosis & Pertinent Problem List: The primary encounter diagnosis was Humeral head fracture, left, sequela. Diagnoses of Lymphedema, Arthritis of left glenohumeral joint, Tendinosis of left rotator cuff, AC joint arthropathy, Chronic pain syndrome, and Suprascapular nerve entrapment, left were also pertinent to this visit.  Visit Diagnosis (New problems to examiner): 1. Humeral head fracture, left, sequela   2. Lymphedema   3. Arthritis of left glenohumeral joint   4. Tendinosis of left rotator cuff   5. AC joint arthropathy   6. Chronic pain syndrome   7. Suprascapular nerve entrapment, left    Plan of Care (Initial workup plan)  Discussed left shoulder suprascapular nerve block and possible RFA with patient as a potential treatment.  I also reviewed MRI with patient.  Future consideration could also include axillary nerve block under ultrasound guidance.  I provided the patient with resources and further information about both of these interventions.  He states that he will think about this further and let us  know if he would like to proceed.  Procedure Orders         SUPRASCAPULAR NERVE BLOCK      Provider-requested follow-up: Return for patient will call us to sch SSN block if interested.  Future Appointments  Date Time Provider [place]  03/20/2022 11:30 AM [facility]URSE [facility] None  03/27/2022 11:30 AM [facility]SE [facility] None  04/03/2022 11:30 AM [facility]SE [facility] None  04/10/2022 11:30 AM [facility]SE [facility] None  04/17/2022 11:15 AM Dew, Erskine Squibb, MD [facility] None    Duration of encounter: 14minutes.  Total time on encounter, as per AMA guidelines included both the face-to-face and non-face-to-face time personally spent by the physician and/or other qualified health care professional(s) on the day of the encounter (includes time in activities that require the physician or other qualified health care professional and does not include time in activities normally performed by clinical staff). Physician's time may include the following activities when performed: Preparing to see the patient (e.g., pre-charting review of records, searching for previously ordered imaging, lab work, and nerve conduction tests) Review of prior analgesic pharmacotherapies. Reviewing PMP Interpreting ordered tests (e.g., lab work, imaging, nerve conduction tests) Performing post-procedure evaluations, including interpretation of diagnostic procedures Obtaining and/or reviewing separately obtained history Performing a medically appropriate examination and/or evaluation Counseling and educating the patient/family/caregiver Ordering medications, tests, or procedures Referring and communicating with other health care professionals (when not separately reported) Documenting clinical information in the electronic or other health record Independently interpreting results (not separately reported) and communicating results to the patient/ family/caregiver Care coordination (not separately reported)  Note by:  Gillis Santa, MD Date: 03/15/2022; Time: 11:48 AM

## 2022-03-15 NOTE — Progress Notes (Signed)
Safety precautions to be maintained throughout the outpatient stay will include: orient to surroundings, keep bed in low position, maintain call bell within reach at all times, provide assistance with transfer out of bed and ambulation.  

## 2022-03-15 NOTE — Patient Instructions (Signed)
We discussed a suprascapular nerve block for your left shoulder pain. If this is helpful, we can further discuss suprascapular nerve ablation.

## 2022-03-20 ENCOUNTER — Ambulatory Visit (INDEPENDENT_AMBULATORY_CARE_PROVIDER_SITE_OTHER): Payer: Medicare Other | Admitting: Nurse Practitioner

## 2022-03-20 ENCOUNTER — Encounter (INDEPENDENT_AMBULATORY_CARE_PROVIDER_SITE_OTHER): Payer: Self-pay

## 2022-03-20 VITALS — BP 135/88 | HR 78 | Resp 18

## 2022-03-20 DIAGNOSIS — I890 Lymphedema, not elsewhere classified: Secondary | ICD-10-CM | POA: Diagnosis not present

## 2022-03-20 NOTE — Progress Notes (Signed)
History of Present Illness  There is no documented history at this time  Assessments & Plan   There are no diagnoses linked to this encounter.    Additional instructions  Subjective:  Patient presents with venous ulcer of the Bilateral lower extremity.    Procedure:  3 layer unna wrap was placed Bilateral lower extremity.   Plan:   Follow up in one week.  

## 2022-03-27 ENCOUNTER — Encounter (INDEPENDENT_AMBULATORY_CARE_PROVIDER_SITE_OTHER): Payer: Self-pay | Admitting: Nurse Practitioner

## 2022-03-27 ENCOUNTER — Ambulatory Visit (INDEPENDENT_AMBULATORY_CARE_PROVIDER_SITE_OTHER): Payer: Medicare Other | Admitting: Nurse Practitioner

## 2022-03-27 VITALS — BP 131/84 | HR 61 | Resp 16 | Wt 299.0 lb

## 2022-03-27 DIAGNOSIS — I890 Lymphedema, not elsewhere classified: Secondary | ICD-10-CM

## 2022-03-27 NOTE — Progress Notes (Unsigned)
History of Present Illness  There is no documented history at this time  Assessments & Plan   There are no diagnoses linked to this encounter.    Additional instructions  Subjective:  Patient presents with venous ulcer of the Bilateral lower extremity.    Procedure:  3 layer unna wrap was placed Bilateral lower extremity.   Plan:   Follow up in one week.  

## 2022-03-28 ENCOUNTER — Encounter (INDEPENDENT_AMBULATORY_CARE_PROVIDER_SITE_OTHER): Payer: Self-pay | Admitting: Nurse Practitioner

## 2022-04-03 ENCOUNTER — Ambulatory Visit (INDEPENDENT_AMBULATORY_CARE_PROVIDER_SITE_OTHER): Payer: Medicare Other | Admitting: Nurse Practitioner

## 2022-04-03 ENCOUNTER — Encounter (INDEPENDENT_AMBULATORY_CARE_PROVIDER_SITE_OTHER): Payer: Self-pay

## 2022-04-03 VITALS — BP 134/87 | HR 70 | Resp 18

## 2022-04-03 DIAGNOSIS — I890 Lymphedema, not elsewhere classified: Secondary | ICD-10-CM | POA: Diagnosis not present

## 2022-04-03 NOTE — Progress Notes (Unsigned)
History of Present Illness  There is no documented history at this time  Assessments & Plan   There are no diagnoses linked to this encounter.    Additional instructions  Subjective:  Patient presents with venous ulcer of the Bilateral lower extremity.    Procedure:  3 layer unna wrap was placed Bilateral lower extremity.   Plan:   Follow up in one week.  

## 2022-04-04 ENCOUNTER — Encounter (INDEPENDENT_AMBULATORY_CARE_PROVIDER_SITE_OTHER): Payer: Self-pay | Admitting: Nurse Practitioner

## 2022-04-10 ENCOUNTER — Encounter (INDEPENDENT_AMBULATORY_CARE_PROVIDER_SITE_OTHER): Payer: Self-pay | Admitting: Nurse Practitioner

## 2022-04-10 ENCOUNTER — Ambulatory Visit (INDEPENDENT_AMBULATORY_CARE_PROVIDER_SITE_OTHER): Payer: Medicare Other | Admitting: Nurse Practitioner

## 2022-04-10 VITALS — BP 122/83 | HR 66 | Resp 16 | Wt 298.0 lb

## 2022-04-10 DIAGNOSIS — I890 Lymphedema, not elsewhere classified: Secondary | ICD-10-CM | POA: Diagnosis not present

## 2022-04-10 NOTE — Progress Notes (Signed)
History of Present Illness  There is no documented history at this time  Assessments & Plan   There are no diagnoses linked to this encounter.    Additional instructions  Subjective:  Patient presents with venous ulcer of the Bilateral lower extremity.    Procedure:  3 layer unna wrap was placed Bilateral lower extremity.   Plan:   Follow up in one week.  

## 2022-04-16 ENCOUNTER — Encounter: Payer: Self-pay | Admitting: Student in an Organized Health Care Education/Training Program

## 2022-04-16 ENCOUNTER — Ambulatory Visit
Payer: Medicare Other | Attending: Student in an Organized Health Care Education/Training Program | Admitting: Student in an Organized Health Care Education/Training Program

## 2022-04-16 ENCOUNTER — Ambulatory Visit: Admission: RE | Admit: 2022-04-16 | Payer: Medicare Other | Source: Ambulatory Visit

## 2022-04-16 VITALS — BP 132/85 | HR 79 | Temp 98.1°F | Resp 18 | Ht 72.0 in | Wt 288.0 lb

## 2022-04-16 DIAGNOSIS — S42292S Other displaced fracture of upper end of left humerus, sequela: Secondary | ICD-10-CM | POA: Insufficient documentation

## 2022-04-16 DIAGNOSIS — M67814 Other specified disorders of tendon, left shoulder: Secondary | ICD-10-CM | POA: Diagnosis present

## 2022-04-16 DIAGNOSIS — M19012 Primary osteoarthritis, left shoulder: Secondary | ICD-10-CM | POA: Insufficient documentation

## 2022-04-16 MED ORDER — LIDOCAINE HCL 2 % IJ SOLN
20.0000 mL | Freq: Once | INTRAMUSCULAR | Status: AC
  Administered 2022-04-16: 100 mg
  Filled 2022-04-16: qty 40

## 2022-04-16 MED ORDER — ROPIVACAINE HCL 2 MG/ML IJ SOLN
9.0000 mL | Freq: Once | INTRAMUSCULAR | Status: AC
  Administered 2022-04-16: 9 mL via PERINEURAL
  Filled 2022-04-16: qty 20

## 2022-04-16 MED ORDER — METHYLPREDNISOLONE ACETATE 80 MG/ML IJ SUSP
80.0000 mg | Freq: Once | INTRAMUSCULAR | Status: AC
  Administered 2022-04-16: 80 mg via INTRA_ARTICULAR
  Filled 2022-04-16: qty 1

## 2022-04-16 MED ORDER — IOHEXOL 180 MG/ML  SOLN
10.0000 mL | Freq: Once | INTRAMUSCULAR | Status: AC
  Administered 2022-04-16: 10 mL via INTRA_ARTICULAR
  Filled 2022-04-16: qty 20

## 2022-04-16 NOTE — Patient Instructions (Addendum)
Pain Management Discharge Instructions  General Discharge Instructions :  If you need to reach your doctor call: Monday-Friday 8:00 am - 4:00 pm at [phone] or toll free [phone].  After clinic hours [phone] to have operator reach doctor.  Bring all of your medication bottles to all your appointments in the pain clinic.  To cancel or reschedule your appointment with Pain Management please remember to call 24 hours in advance to avoid a fee.  Refer to the educational materials which you have been given on: General Risks, I had my Procedure. Discharge Instructions, Post Sedation.  Post Procedure Instructions:  The drugs you were given will stay in your system until tomorrow, so for the next 24 hours you should not drive, make any legal decisions or drink any alcoholic beverages.  You may eat anything you prefer, but it is better to start with liquids then soups and crackers, and gradually work up to solid foods.  Please notify your doctor immediately if you have any unusual bleeding, trouble breathing or pain that is not related to your normal pain.  Depending on the type of procedure that was done, some parts of your body may feel week and/or numb.  This usually clears up by tonight or the next day.  Walk with the use of an assistive device or accompanied by an adult for the 24 hours.  You may use ice on the affected area for the first 24 hours.  Put ice in a Ziploc bag and cover with a towel and place against area 15 minutes on 15 minutes off.  You may switch to heat after 24 hours.Pain Management Discharge Instructions  General Discharge Instructions :  If you need to reach your doctor call: Monday-Friday 8:00 am - 4:00 pm at [phone] or toll free [phone].  After clinic hours [phone] to have operator reach doctor.  Bring all of your medication bottles to all your appointments in the pain clinic.  To cancel or reschedule your appointment with Pain  Management please remember to call 24 hours in advance to avoid a fee.  Refer to the educational materials which you have been given on: General Risks, I had my Procedure. Discharge Instructions, Post Sedation.  Post Procedure Instructions:  The drugs you were given will stay in your system until tomorrow, so for the next 24 hours you should not drive, make any legal decisions or drink any alcoholic beverages.  You may eat anything you prefer, but it is better to start with liquids then soups and crackers, and gradually work up to solid foods.  Please notify your doctor immediately if you have any unusual bleeding, trouble breathing or pain that is not related to your normal pain.  Depending on the type of procedure that was done, some parts of your body may feel week and/or numb.  This usually clears up by tonight or the next day.  Walk with the use of an assistive device or accompanied by an adult for the 24 hours.  You may use ice on the affected area for the first 24 hours.  Put ice in a Ziploc bag and cover with a towel and place against area 15 minutes on 15 minutes off.  You may switch to heat after 24 hours.

## 2022-04-16 NOTE — Progress Notes (Signed)
PROVIDER NOTE: Interpretation of information contained herein should be left to medically-trained personnel. Specific patient instructions are provided elsewhere under "Patient Instructions" section of medical record. This document was created in part using STT-dictation technology, any transcriptional errors that may result from this process are unintentional.  Patient: Warren Leblanc Type: Established DOB: 04/17/1945 [name]: [id number] PCP: Delma Freeze, MD  Service: Procedure DOS: 04/16/2022 Setting: Ambulatory Location: Ambulatory outpatient facility Delivery: Face-to-face Provider: Gillis Santa, MD Specialty: Interventional Pain Management Specialty designation: 09 Location: Outpatient facility Ref. Prov.: Delma Freeze, MD       Interventional Therapy   Procedure: Suprascapular nerve block (SSNB) #1  Laterality:  Left  Level: Superior to scapular spine, lateral to supraspinatus fossa (Suprascapular notch).  Imaging: Ultrasound-guided         Anesthesia: Local anesthesia (1-2% Lidocaine)  DOS: 04/16/2022  Performed by: Gillis Santa, MD  Purpose: Diagnostic/Therapeutic Indications: Shoulder pain, severe enough to impact quality of life and/or function. 1. Arthritis of left glenohumeral joint   2. Tendinosis of left rotator cuff   3. Humeral head fracture, left, sequela    NAS-11 score:   Pre-procedure: 0-No pain (when not using the arm the pain is not there, when he moves a certain way the pain becomes severe)/10   Post-procedure: 0-No pain (when not using the arm the pain is not there, when he moves a certain way the pain becomes severe)/10     Target: Suprascapular nerve Location: midway between the medial border of the scapula and the acromion as it runs through the suprascapular notch. Region: Suprascapular, posterior shoulder  Approach: Percutaneous  Neuroanatomy: The suprascapular nerve is the lateral branch of the superior trunk of the brachial plexus. It receives  nerve fibers that originate in the nerve roots C5 and C6 (and sometimes C4). It is a mixed nerve, meaning that it provides both sensory and motor supply for the suprascapular region. Function: The main function of this nerve is to provide motor innervation for two muscles, the supraspinatus and infraspinatus muscles. They are part of the rotator cuff muscles. In addition, the suprascapular nerve provides a sensory supply to the joints of the scapula (glenohumeral and acromioclavicular joints). Rationale (medical necessity): procedure needed and proper for the diagnosis and/or treatment of the patient's medical symptoms and needs.  Position / Prep / Materials:  Position: Prone Materials:  Tray: Block Needle(s):  Type: Spinal  Gauge (G): 22  Length: 3.5 in.  Qty: 1 Prep solution: DuraPrep (Iodine Povacrylex [0.7% available iodine] and Isopropyl Alcohol, 74% w/w) Prep Area: Entire posterior shoulder area. From upper spine to shoulder proper (upper arm), and from lateral neck to lower tip of shoulder blade.   Pre-op H&P Assessment:  Warren Leblanc is a 77 y.o. (year old), male patient, seen today for interventional treatment. He  has no past surgical history on file. Warren Leblanc has a current medication list which includes the following prescription(s): vitamin c, biotin, cyanocobalamin, folic acid, lisinopril-hydrochlorothiazide, magnesium oxide, niacinamide-zn-cu-methfo-se-cr, potassium, turmeric, and UNABLE TO FIND. His primarily concern today is the Shoulder Pain (Left )  Initial Vital Signs:  Pulse/HCG Rate: 70  Temp: 98.1 F (36.7 C) Resp: 16 BP: 135/82 SpO2: 97 %  BMI: Estimated body mass index is 39.06 kg/m as calculated from the following:   Height as of this encounter: 6' (1.829 m).   Weight as of this encounter: 288 lb (130.6 kg).  Risk Assessment: Allergies: Reviewed. He is allergic to amoxicillin; amoxicillin-pot clavulanate; antihistamines, chlorpheniramine-type; and  diphenhydramine hcl.  Allergy Precautions: None required Coagulopathies: Reviewed. None identified.  Blood-thinner therapy: None at this time Active Infection(s): Reviewed. None identified. Warren Leblanc is afebrile  Site Confirmation: Warren Leblanc was asked to confirm the procedure and laterality before marking the site Procedure checklist: Completed Consent: Before the procedure and under the influence of no sedative(s), amnesic(s), or anxiolytics, the patient was informed of the treatment options, risks and possible complications. To fulfill our ethical and legal obligations, as recommended by the American Medical Association's Code of Ethics, I have informed the patient of my clinical impression; the nature and purpose of the treatment or procedure; the risks, benefits, and possible complications of the intervention; the alternatives, including doing nothing; the risk(s) and benefit(s) of the alternative treatment(s) or procedure(s); and the risk(s) and benefit(s) of doing nothing. The patient was provided information about the general risks and possible complications associated with the procedure. These may include, but are not limited to: failure to achieve desired goals, infection, bleeding, organ or nerve damage, allergic reactions, paralysis, and death. In addition, the patient was informed of those risks and complications associated to the procedure, such as failure to decrease pain; infection; bleeding; organ or nerve damage with subsequent damage to sensory, motor, and/or autonomic systems, resulting in permanent pain, numbness, and/or weakness of one or several areas of the body; allergic reactions; (i.e.: anaphylactic reaction); and/or death. Furthermore, the patient was informed of those risks and complications associated with the medications. These include, but are not limited to: allergic reactions (i.e.: anaphylactic or anaphylactoid reaction(s)); adrenal axis suppression; blood sugar elevation  that in diabetics may result in ketoacidosis or comma; water retention that in patients with history of congestive heart failure may result in shortness of breath, pulmonary edema, and decompensation with resultant heart failure; weight gain; swelling or edema; medication-induced neural toxicity; particulate matter embolism and blood vessel occlusion with resultant organ, and/or nervous system infarction; and/or aseptic necrosis of one or more joints. Finally, the patient was informed that Medicine is not an exact science; therefore, there is also the possibility of unforeseen or unpredictable risks and/or possible complications that may result in a catastrophic outcome. The patient indicated having understood very clearly. We have given the patient no guarantees and we have made no promises. Enough time was given to the patient to ask questions, all of which were answered to the patient's satisfaction. Mr. Grissett has indicated that he wanted to continue with the procedure. Attestation: I, the ordering provider, attest that I have discussed with the patient the benefits, risks, side-effects, alternatives, likelihood of achieving goals, and potential problems during recovery for the procedure that I have provided informed consent. Date  Time: 04/16/2022  1:07 PM  Pre-Procedure Preparation:  Monitoring: As per clinic protocol. Respiration, ETCO2, SpO2, BP, heart rate and rhythm monitor placed and checked for adequate function Safety Precautions: Patient was assessed for positional comfort and pressure points before starting the procedure. Time-out: I initiated and conducted the "Time-out" before starting the procedure, as per protocol. The patient was asked to participate by confirming the accuracy of the "Time Out" information. Verification of the correct person, site, and procedure were performed and confirmed by me, the nursing staff, and the patient. "Time-out" conducted as per Joint Commission's Universal  Protocol (UP.01.01.01). Time: 1344  Description of Procedure:          Procedural Technique Safety Precautions: Aspiration looking for blood return was conducted prior to all injections. At no point did we inject any substances,  as a needle was being advanced. No attempts were made at seeking any paresthesias. Safe injection practices and needle disposal techniques used. Medications properly checked for expiration dates. SDV (single dose vial) medications used. Description of the Procedure: Protocol guidelines were followed. The patient was placed in position over the procedure table. The target area was identified and the area prepped in the usual manner. Skin & deeper tissues infiltrated with local anesthetic. Appropriate amount of time allowed to pass for local anesthetics to take effect. The procedure needles were then advanced to the target area. Proper needle placement secured. Negative aspiration confirmed. Solution injected in intermittent fashion, asking for systemic symptoms every 0.5cc of injectate. The needles were then removed and the area cleansed, making sure to leave some of the prepping solution back to take advantage of its long term bactericidal properties.  Ultrasound guidance for left suprascapular nerve block.  Scapular spine was identified under ultrasound guidance along with trapezius muscle and supraspinatus.  The transverse scapular ligament was identified and entered under ultrasound guidance and 5 cc solution made of 4 cc of 0.2% ropivacaine, 1 cc of methylprednisolone, 80 mg/cc was injected.   Vitals:   04/16/22 1320 04/16/22 1344 04/16/22 1348 04/16/22 1353  BP: 135/82 125/84 130/85 132/85  Pulse: 70 87 82 79  Resp: '16 18 17 18  '$ Temp: 98.1 F (36.7 C)     TempSrc: Temporal     SpO2: 97% 98% 96% 97%  Weight: 288 lb (130.6 kg)     Height: 6' (1.829 m)        Start Time: 1344 hrs. End Time: 1354 hrs.  Imaging Guidance (nonSpinal):          Type of Imaging  Technique: Ultrasound guidance Indication(s): Assistance in needle guidance and placement for procedures requiring needle placement in or near specific anatomical locations not easily accessible without such assistance. Exposure Time: Please see nurses notes. Contrast: None used.  Antibiotic Prophylaxis:   Anti-infectives (From admission, onward)    None      Indication(s): None identified  Post-operative Assessment:  Post-procedure Vital Signs:  Pulse/HCG Rate: 79  Temp: 98.1 F (36.7 C) Resp: 18 BP: 132/85 SpO2: 97 %  EBL: None  Complications: No immediate post-treatment complications observed by team, or reported by patient.  Note: The patient tolerated the entire procedure well. A repeat set of vitals were taken after the procedure and the patient was kept under observation following institutional policy, for this type of procedure. Post-procedural neurological assessment was performed, showing return to baseline, prior to discharge. The patient was provided with post-procedure discharge instructions, including a section on how to identify potential problems. Should any problems arise concerning this procedure, the patient was given instructions to immediately contact us, at any time, without hesitation. In any case, we plan to contact the patient by telephone for a follow-up status report regarding this interventional procedure.  Comments:  No additional relevant information.  Plan of Care (POC)  Orders:  No orders of the defined types were placed in this encounter.    Medications ordered for procedure: Meds ordered this encounter  Medications   iohexol (OMNIPAQUE) 180 MG/ML injection 10 mL    Must be Myelogram-compatible. If not available, you may substitute with a water-soluble, non-ionic, hypoallergenic, myelogram-compatible radiological contrast medium.   lidocaine (XYLOCAINE) 2 % (with pres) injection 400 mg   methylPREDNISolone acetate (DEPO-MEDROL) injection 80  mg   ropivacaine (PF) 2 mg/mL (0.2%) (NAROPIN) injection 9 mL   Medications administered:  We administered iohexol, lidocaine, methylPREDNISolone acetate, and ropivacaine (PF) 2 mg/mL (0.2%).  See the medical record for exact dosing, route, and time of administration.  Follow-up plan:   Return in about 4 weeks (around 05/14/2022) for PPE VV.       Recent Visits Date Type Provider Dept  03/15/22 Office Visit Gillis Santa, MD [facility]  Showing recent visits within past 90 days and meeting all other requirements Today's Visits Date Type Provider Dept  04/16/22 Procedure visit Gillis Santa, MD [facility]  Showing today's visits and meeting all other requirements Future Appointments Date Type Provider Dept  05/14/22 Appointment Gillis Santa, MD [facility]  Showing future appointments within next 90 days and meeting all other requirements  Disposition: Discharge home  Discharge (Date  Time): 04/16/2022; 1405 hrs.   Primary Care Physician: Delma Freeze, MD Location: [facility] Note by: Gillis Santa, MD (TTS technology used. I apologize for any typographical errors that were not detected and corrected.) Date: 04/16/2022; Time: 2:47 PM  Disclaimer:  Medicine is not an Chief Strategy Officer. The only guarantee in medicine is that nothing is guaranteed. It is important to note that the decision to proceed with this intervention was based on the information collected from the patient. The Data and conclusions were drawn from the patient's questionnaire, the interview, and the physical examination. Because the information was provided in large part by the patient, it cannot be guaranteed that it has not been purposely or unconsciously manipulated. Every effort has been made to obtain as much relevant data as possible for this evaluation. It is important to note that the conclusions that lead to this procedure are derived in large part  from the available data. Always take into account that the treatment will also be dependent on availability of resources and existing treatment guidelines, considered by other Pain Management Practitioners as being common knowledge and practice, at the time of the intervention. For Medico-Legal purposes, it is also important to point out that variation in procedural techniques and pharmacological choices are the acceptable norm. The indications, contraindications, technique, and results of the above procedure should only be interpreted and judged by a Board-Certified Interventional Pain Specialist with extensive familiarity and expertise in the same exact procedure and technique.

## 2022-04-16 NOTE — Progress Notes (Signed)
Safety precautions to be maintained throughout the outpatient stay will include: orient to surroundings, keep bed in low position, maintain call bell within reach at all times, provide assistance with transfer out of bed and ambulation.  

## 2022-04-17 ENCOUNTER — Ambulatory Visit (INDEPENDENT_AMBULATORY_CARE_PROVIDER_SITE_OTHER): Payer: Medicare Other | Admitting: Vascular Surgery

## 2022-04-17 ENCOUNTER — Telehealth: Payer: Self-pay

## 2022-04-17 ENCOUNTER — Encounter (INDEPENDENT_AMBULATORY_CARE_PROVIDER_SITE_OTHER): Payer: Self-pay | Admitting: Vascular Surgery

## 2022-04-17 VITALS — BP 129/86 | HR 69 | Resp 18 | Ht 72.0 in | Wt 297.0 lb

## 2022-04-17 DIAGNOSIS — L03116 Cellulitis of left lower limb: Secondary | ICD-10-CM

## 2022-04-17 DIAGNOSIS — M7989 Other specified soft tissue disorders: Secondary | ICD-10-CM

## 2022-04-17 DIAGNOSIS — I890 Lymphedema, not elsewhere classified: Secondary | ICD-10-CM | POA: Diagnosis not present

## 2022-04-17 DIAGNOSIS — I10 Essential (primary) hypertension: Secondary | ICD-10-CM | POA: Diagnosis not present

## 2022-04-17 NOTE — Telephone Encounter (Signed)
Post procedure follow up.  Patient states he is doing good.  

## 2022-04-17 NOTE — Progress Notes (Signed)
[name] : [id number]  Warren Leblanc is a 77 y.o. (09-01-1945) male who presents with chief complaint of  Chief Complaint  Patient presents with   Follow-up    f/u unnaboot  .  History of Present Illness: Patient returns today in follow up of his leg swelling.  Not much change in his legs.  He recently had a shot in his left shoulder for pain.  No fever or chills.  Current Outpatient Medications  Medication Sig Dispense Refill   Ascorbic Acid (VITAMIN C) 1000 MG tablet Take by mouth.     Biotin 2.5 MG TABS Take by mouth.     Cyanocobalamin (VITAMIN B 12 PO) Take by mouth.     folic acid (FOLVITE) 1 MG tablet Take by mouth.     lisinopril-hydrochlorothiazide (ZESTORETIC) 20-12.5 MG tablet Take 2 tablets by mouth daily.      MAGNESIUM OXIDE PO Take by mouth.     Niacinamide-Zn-Cu-Methfo-Se-Cr (NICOTINAMIDE PO) Take 500 mg by mouth in the morning and at bedtime.     Potassium 99 MG TABS Take by mouth.     TURMERIC PO Take by mouth.     UNABLE TO FIND [place]     No current facility-administered medications for this visit.    Past Medical History:  Diagnosis Date   Hypertension     No past surgical history on file.   Social History   Tobacco Use   Smoking status: Some Days   Smokeless tobacco: Never  Substance Use Topics   Alcohol use: No   Drug use: No      No family history on file.   Allergies  Allergen Reactions   Amoxicillin    Amoxicillin-Pot Clavulanate    Antihistamines, Chlorpheniramine-Type Nausea Only   Diphenhydramine Hcl     Other reaction(s): Other (See Comments) drowsy    REVIEW OF SYSTEMS (Negative unless checked)   Constitutional: '[]'$ Weight loss  '[]'$ Fever  '[]'$ Chills Cardiac: '[]'$ Chest pain   '[]'$ Chest pressure   '[]'$ Palpitations   '[]'$ Shortness of breath when laying flat   '[]'$ Shortness of breath at rest   '[]'$ Shortness of breath with exertion. Vascular:  '[x]'$ Pain in legs with walking   '[x]'$ Pain in legs at rest   '[]'$ Pain in legs when laying flat    '[]'$ Claudication   '[]'$ Pain in feet when walking  '[]'$ Pain in feet at rest  '[]'$ Pain in feet when laying flat   '[]'$ History of DVT   '[]'$ Phlebitis   '[x]'$ Swelling in legs   '[]'$ Varicose veins   '[]'$ Non-healing ulcers Pulmonary:   '[]'$ Uses home oxygen   '[]'$ Productive cough   '[]'$ Hemoptysis   '[]'$ Wheeze  '[]'$ COPD   '[]'$ Asthma Neurologic:  '[]'$ Dizziness  '[]'$ Blackouts   '[]'$ Seizures   '[]'$ History of stroke   '[]'$ History of TIA  '[]'$ Aphasia   '[]'$ Temporary blindness   '[]'$ Dysphagia   '[]'$ Weakness or numbness in arms   '[]'$ Weakness or numbness in legs Musculoskeletal:  '[x]'$ Arthritis   '[]'$ Joint swelling   '[x]'$ Joint pain   '[]'$ Low back pain Hematologic:  '[]'$ Easy bruising  '[]'$ Easy bleeding   '[]'$ Hypercoagulable state   '[]'$ Anemic   Gastrointestinal:  '[]'$ Blood in stool   '[]'$ Vomiting blood  '[]'$ Gastroesophageal reflux/heartburn   '[]'$ Abdominal pain Genitourinary:  '[]'$ Chronic kidney disease   '[]'$ Difficult urination  '[]'$ Frequent urination  '[]'$ Burning with urination   '[]'$ Hematuria Skin:  '[x]'$ Rashes   '[]'$ Ulcers   '[]'$ Wounds Psychological:  '[]'$ History of anxiety   '[]'$  History of major depression  Physical Examination  BP 129/86 (BP Location: Right Arm)   Pulse 69  Resp 18   Ht 6' (1.829 m)   Wt 297 lb (134.7 kg)   BMI 40.28 kg/m  Gen:  WD/WN, NAD Head: [place]/AT, No temporalis wasting. Ear/Nose/Throat: Hearing grossly intact, nares w/o erythema or drainage Eyes: Conjunctiva clear. Sclera non-icteric Neck: Supple.  Trachea midline Pulmonary:  Good air movement, no use of accessory muscles.  Cardiac: RRR, no JVD Vascular:  Vessel Right Left  Radial Palpable Palpable               Musculoskeletal: M/S 5/5 throughout.  No deformity or atrophy. 1-2+ BLE edema. Neurologic: Sensation grossly intact in extremities.  Symmetrical.  Speech is fluent.  Psychiatric: Judgment intact, Mood & affect appropriate for pt's clinical situation. Dermatologic: No rashes or ulcers noted.  No cellulitis or open wounds.      Labs No results found for this or any previous visit (from the past  2160 hour(s)).  Radiology No results found.  Assessment/Plan  Essential hypertension, benign blood pressure control important in reducing the progression of atherosclerotic disease. On appropriate oral medications.     Cellulitis of leg, right Several months ago, had a course of amoxil, improved   Swelling of limb Stable, mild improvement.  Remains significant   Lymphedema Stable with slight improvement.  Continue current measures.  Return to clinic to see me in a month or so.  Weekly Unna boots. A three layer UNNA boot was placed on both LE today.   Leotis Pain, MD  04/17/2022 1:15 PM    This note was created with Dragon medical transcription system.  Any errors from dictation are purely unintentional

## 2022-04-24 ENCOUNTER — Encounter (INDEPENDENT_AMBULATORY_CARE_PROVIDER_SITE_OTHER): Payer: Self-pay

## 2022-04-24 ENCOUNTER — Ambulatory Visit (INDEPENDENT_AMBULATORY_CARE_PROVIDER_SITE_OTHER): Payer: Medicare Other | Admitting: Nurse Practitioner

## 2022-04-24 VITALS — BP 129/81 | HR 75 | Resp 16 | Wt 296.0 lb

## 2022-04-24 DIAGNOSIS — I890 Lymphedema, not elsewhere classified: Secondary | ICD-10-CM

## 2022-04-24 NOTE — Progress Notes (Signed)
History of Present Illness  There is no documented history at this time  Assessments & Plan   There are no diagnoses linked to this encounter.    Additional instructions  Subjective:  Patient presents with venous ulcer of the Bilateral lower extremity.    Procedure:  3 layer unna wrap was placed Bilateral lower extremity.   Plan:   Follow up in one week.  

## 2022-05-01 ENCOUNTER — Encounter (INDEPENDENT_AMBULATORY_CARE_PROVIDER_SITE_OTHER): Payer: Self-pay | Admitting: Nurse Practitioner

## 2022-05-01 ENCOUNTER — Ambulatory Visit (INDEPENDENT_AMBULATORY_CARE_PROVIDER_SITE_OTHER): Payer: Medicare Other | Admitting: Nurse Practitioner

## 2022-05-01 DIAGNOSIS — I890 Lymphedema, not elsewhere classified: Secondary | ICD-10-CM

## 2022-05-01 NOTE — Progress Notes (Signed)
History of Present Illness  There is no documented history at this time  Assessments & Plan   There are no diagnoses linked to this encounter.    Additional instructions  Subjective:  Patient presents with venous ulcer of the Bilateral lower extremity.    Procedure:  3 layer unna wrap was placed Bilateral lower extremity.   Plan:   Follow up in one week.  Aquacel on both lower anterior legs 2 small places

## 2022-05-08 ENCOUNTER — Ambulatory Visit (INDEPENDENT_AMBULATORY_CARE_PROVIDER_SITE_OTHER): Payer: Medicare Other | Admitting: Vascular Surgery

## 2022-05-08 ENCOUNTER — Telehealth: Payer: Self-pay

## 2022-05-08 ENCOUNTER — Encounter (INDEPENDENT_AMBULATORY_CARE_PROVIDER_SITE_OTHER): Payer: Medicare Other

## 2022-05-08 ENCOUNTER — Telehealth: Payer: Self-pay | Admitting: *Deleted

## 2022-05-08 ENCOUNTER — Encounter: Payer: Self-pay | Admitting: Student in an Organized Health Care Education/Training Program

## 2022-05-08 VITALS — BP 137/87 | HR 74 | Resp 17 | Ht 72.0 in | Wt 295.0 lb

## 2022-05-08 DIAGNOSIS — I890 Lymphedema, not elsewhere classified: Secondary | ICD-10-CM | POA: Diagnosis not present

## 2022-05-08 NOTE — Telephone Encounter (Signed)
Attempted to call twice for post procedure follow-up. Both times, the phone rang twice, then disconnected.

## 2022-05-08 NOTE — Telephone Encounter (Signed)
Call and spoke to patient to discuss nursing admission questions prior to telephone visit appointment for 05/09/22.   Al Decant, RN

## 2022-05-08 NOTE — Progress Notes (Signed)
History of Present Illness  There is no documented history at this time  Assessments & Plan   There are no diagnoses linked to this encounter.    Additional instructions  Subjective:  Patient presents with venous ulcer of the Bilateral lower extremity.    Procedure:  3 layer unna wrap was placed Bilateral lower extremity.   Plan:   Follow up in one week.  

## 2022-05-09 ENCOUNTER — Encounter: Payer: Self-pay | Admitting: Student in an Organized Health Care Education/Training Program

## 2022-05-09 ENCOUNTER — Encounter (INDEPENDENT_AMBULATORY_CARE_PROVIDER_SITE_OTHER): Payer: Self-pay | Admitting: Nurse Practitioner

## 2022-05-09 ENCOUNTER — Ambulatory Visit
Payer: Medicare Other | Attending: Student in an Organized Health Care Education/Training Program | Admitting: Student in an Organized Health Care Education/Training Program

## 2022-05-09 DIAGNOSIS — M19012 Primary osteoarthritis, left shoulder: Secondary | ICD-10-CM

## 2022-05-09 DIAGNOSIS — M67814 Other specified disorders of tendon, left shoulder: Secondary | ICD-10-CM

## 2022-05-09 DIAGNOSIS — S42292S Other displaced fracture of upper end of left humerus, sequela: Secondary | ICD-10-CM

## 2022-05-09 DIAGNOSIS — G894 Chronic pain syndrome: Secondary | ICD-10-CM

## 2022-05-09 NOTE — Progress Notes (Signed)
Patient: Warren Leblanc  Service Category: E/M  Provider: Gillis Santa, MD  DOB: [birth year]  DOS: 05/09/2022  Location: Office  [name]: [id number]  Setting: Ambulatory outpatient  Referring Provider: Delma Freeze, MD  Type: Established Patient  Specialty: Interventional Pain Management  PCP: Delma Freeze, MD  Location: Remote location  Delivery: TeleHealth     Virtual Encounter - Pain Management PROVIDER NOTE: Information contained herein reflects review and annotations entered in association with encounter. Interpretation of such information and data should be left to medically-trained personnel. Information provided to patient can be located elsewhere in the medical record under "Patient Instructions". Document created using STT-dictation technology, any transcriptional errors that may result from process are unintentional.    Contact & Pharmacy Preferred: [phone] Home: [phone] (home) Mobile: [phone] (mobile) E-mail: [email or web address], [place] - [address] [place] [address] Phone: [phone] Fax: [phone]   Pre-screening  Warren Leblanc offered "in-person" vs "virtual" encounter. He indicated preferring virtual for this encounter.   Reason COVID-19*  Social distancing based on CDC and AMA recommendations.   I contacted Warren Leblanc on 05/09/2022 via telephone.      I clearly identified myself as Gillis Santa, MD. I verified that I was speaking with the correct person using two identifiers (Name: Warren Leblanc, and date of birth: 1945/07/29).  Consent I sought verbal advanced consent from Warren Leblanc for virtual visit interactions. I informed Warren Leblanc of possible security and privacy concerns, risks, and limitations associated with providing "not-in-person" medical evaluation and management services. I also informed Warren Leblanc of the availability of "in-person" appointments. Finally, I informed him that there  would be a charge for the virtual visit and that he could be  personally, fully or partially, financially responsible for it. Warren Leblanc expressed understanding and agreed to proceed.   Historic Elements   Warren Leblanc is a 77 y.o. year old, male patient evaluated today after our last contact on 04/16/2022. Warren Leblanc  has a past medical history of Hypertension. He also  has no past surgical history on file. Warren Leblanc has a current medication list which includes the following prescription(s): vitamin c, biotin, cyanocobalamin, folic acid, lisinopril-hydrochlorothiazide, magnesium oxide, niacinamide-zn-cu-methfo-se-cr, potassium, turmeric, and UNABLE TO FIND. He  reports that he has been smoking. He has never used smokeless tobacco. He reports that he does not drink alcohol and does not use drugs. Warren Leblanc is allergic to amoxicillin; amoxicillin-pot clavulanate; antihistamines, chlorpheniramine-type; and diphenhydramine hcl.  BMI: Estimated body mass index is 40.01 kg/m as calculated from the following:   Height as of 05/08/22: 6' (1.829 m).   Weight as of 05/08/22: 295 lb (133.8 kg). Last encounter: 03/15/2022. Last procedure: 04/16/2022.  HPI  Today, he is being contacted for a post-procedure assessment.   Post-procedure evaluation   Procedure: Suprascapular nerve block (SSNB) #1  Laterality:  Left  Level: Superior to scapular spine, lateral to supraspinatus fossa (Suprascapular notch).  Imaging: Ultrasound-guided         Anesthesia: Local anesthesia (1-2% Lidocaine)  DOS: 04/16/2022  Performed by: Gillis Santa, MD  Purpose: Diagnostic/Therapeutic Indications: Shoulder pain, severe enough to impact quality of life and/or function. 1. Arthritis of left glenohumeral joint   2. Tendinosis of left rotator cuff   3. Humeral head fracture, left, sequela    NAS-11 score:   Pre-procedure: 0-No pain (when not using the arm the pain is not there, when he moves  a certain way the pain becomes  severe)/10   Post-procedure: 0-No pain (when not using the arm the pain is not there, when he moves a certain way the pain becomes severe)/10      Effectiveness:  Initial hour after procedure: 100 %  Subsequent 4-6 hours post-procedure: 100 %  Analgesia past initial 6 hours: 0 %  Ongoing improvement:  Analgesic:  0%   Imaging  MR SHOULDER LEFT WO CONTRAST CLINICAL DATA:  [place] 06/01/2020.  Humerus fracture.  EXAM: MRI OF THE LEFT SHOULDER WITHOUT CONTRAST  TECHNIQUE: Multiplanar, multisequence MR imaging of the shoulder was performed. No intravenous contrast was administered.  COMPARISON:  Left shoulder radiographs 06/01/2020  FINDINGS: Rotator cuff: Mild intermediate T2 signal posterior supraspinatus and anterior infraspinatus tendinosis. Mild linear fluid bright signal along the longitudinal dimension of the superior subscapularis tendon (sagittal series 8 images 8 through 11), a small partial-thickness interstitial tear. The teres minor is intact.  Muscles: No rotator cuff muscle atrophy, fatty infiltration, or edema.  Biceps long head: The intra-articular long head of the biceps tendon is intact.  Acromioclavicular Joint: There are moderate degenerative changes of the acromioclavicular joint including joint space narrowing, subchondral marrow edema, and peripheral osteophytosis. Type II acromion. No subacromial/subdeltoid bursitis.  Glenohumeral Joint: Moderate thinning of the anterior glenoid and inferior medial humeral head cartilage. High-grade thinning of the anterior glenoid cartilage with the anterior inferior glenoid subchondral marrow edema. Possible nondisplaced anterior inferior glenoid fracture line (sagittal series 9, image 5).  Mild glenohumeral joint effusion with multiple tiny anterior inferior low signal foci, likely small loose bodies.  Labrum: Intermediate T2 signal and thickening degenerative irregularity of the anterior inferior glenoid  labrum.  Bones: There are diffuse decreased T1 increased T2 signal fluid bright fracture lines corresponding to the previously seen markedly comminuted fracture involving the majority of the humeral head and extending into the anterior and lateral aspects of the surgical neck. There is up to 10 mm lateral cortical step-off of the inferolateral aspect of the humeral head (axial series 5, image 19). There is approximately 5 mm medial displacement of the distal fracture component (humeral shaft) with respect to the proximal fracture component (humeral head). The anteromedial humeral head fracture lines extend to the anteromedial cartilage surface (axial images 15 through 18).  Other: None.  IMPRESSION:: IMPRESSION: 1. Markedly comminuted acute fracture of the humeral head and surgical neck with 5 mm medial displacement of the distal fracture component of the humeral shaft. 2. Moderate anterior inferior glenoid cartilage degenerative changes with subchondral marrow edema. Possible nondisplaced anterior inferior glenoid fracture line. 3. Mild posterior supraspinatus and anterior infraspinatus tendinosis. Tiny linear superior subscapularis longitudinal midsubstance tear without tendon retraction. 4. Moderate degenerative changes of the acromioclavicular joint.  Electronically Signed   By: Yvonne Kendall M.D.   On: 06/02/2021 10:35  Assessment  The primary encounter diagnosis was Arthritis of left glenohumeral joint. Diagnoses of Tendinosis of left rotator cuff, Humeral head fracture, left, sequela, and Chronic pain syndrome were also pertinent to this visit.  Plan of Care  Plan for left axillary nerve block under US guidance.   Orders:  Orders Placed This Encounter  Procedures   AXILLARY NERVE BLOCK    Standing Status:   Future    Standing Expiration Date:   08/08/2022    Scheduling Instructions:     PROCEDURE: Axillary Nerve Block (CPT 450 [id number] [id number])     LATERALITY: LEFT      TIMEFRAME: ASAA    Order Specific Question:  Where will this procedure be performed?    Answer:   [facility]   Follow-up plan:   Return in about 1 month (around 06/11/2022) for Left axillary nerve block under [place] , in clinic [facility].      Recent Visits Date Type Provider Dept  04/16/22 Procedure visit Gillis Santa, MD [facility]  03/15/22 Office Visit Gillis Santa, MD [facility]  Showing recent visits within past 90 days and meeting all other requirements Today's Visits Date Type Provider Dept  05/09/22 Office Visit Gillis Santa, MD [facility]  Showing today's visits and meeting all other requirements Future Appointments No visits were found meeting these conditions. Showing future appointments within next 90 days and meeting all other requirements  I discussed the assessment and treatment plan with the patient. The patient was provided an opportunity to ask questions and all were answered. The patient agreed with the plan and demonstrated an understanding of the instructions.  Patient advised to call back or seek an in-person evaluation if the symptoms or condition worsens.  Duration of encounter: 49minutes.  Note by: Gillis Santa, MD Date: 05/09/2022; Time: 3:52 PM

## 2022-05-14 ENCOUNTER — Telehealth: Payer: Medicare Other | Admitting: Student in an Organized Health Care Education/Training Program

## 2022-05-14 ENCOUNTER — Encounter (INDEPENDENT_AMBULATORY_CARE_PROVIDER_SITE_OTHER): Payer: Self-pay | Admitting: Nurse Practitioner

## 2022-05-15 ENCOUNTER — Encounter (INDEPENDENT_AMBULATORY_CARE_PROVIDER_SITE_OTHER): Payer: Self-pay | Admitting: Nurse Practitioner

## 2022-05-15 ENCOUNTER — Ambulatory Visit (INDEPENDENT_AMBULATORY_CARE_PROVIDER_SITE_OTHER): Payer: Medicare Other | Admitting: Nurse Practitioner

## 2022-05-15 ENCOUNTER — Encounter (INDEPENDENT_AMBULATORY_CARE_PROVIDER_SITE_OTHER): Payer: Medicare Other

## 2022-05-15 VITALS — BP 127/81 | HR 60 | Resp 16 | Wt 290.0 lb

## 2022-05-15 DIAGNOSIS — I890 Lymphedema, not elsewhere classified: Secondary | ICD-10-CM

## 2022-05-15 NOTE — Progress Notes (Signed)
History of Present Illness  There is no documented history at this time  Assessments & Plan   There are no diagnoses linked to this encounter.    Additional instructions  Subjective:  Patient presents with venous ulcer of the Bilateral lower extremity.    Procedure:  3 layer unna wrap was placed Bilateral lower extremity.   Plan:   Follow up in one week.  

## 2022-05-22 ENCOUNTER — Encounter (INDEPENDENT_AMBULATORY_CARE_PROVIDER_SITE_OTHER): Payer: Self-pay

## 2022-05-22 ENCOUNTER — Encounter (INDEPENDENT_AMBULATORY_CARE_PROVIDER_SITE_OTHER): Payer: Medicare Other

## 2022-05-22 ENCOUNTER — Ambulatory Visit (INDEPENDENT_AMBULATORY_CARE_PROVIDER_SITE_OTHER): Payer: Medicare Other | Admitting: Nurse Practitioner

## 2022-05-22 VITALS — BP 120/82 | HR 71 | Resp 16 | Wt 296.0 lb

## 2022-05-22 DIAGNOSIS — I890 Lymphedema, not elsewhere classified: Secondary | ICD-10-CM

## 2022-05-22 NOTE — Progress Notes (Signed)
History of Present Illness  There is no documented history at this time  Assessments & Plan   There are no diagnoses linked to this encounter.    Additional instructions  Subjective:  Patient presents with venous ulcer of the Bilateral lower extremity.    Procedure:  3 layer unna wrap was placed Bilateral lower extremity.   Plan:   Follow up in one week.  

## 2022-05-29 ENCOUNTER — Ambulatory Visit (INDEPENDENT_AMBULATORY_CARE_PROVIDER_SITE_OTHER): Payer: Medicare Other | Admitting: Vascular Surgery

## 2022-05-29 ENCOUNTER — Ambulatory Visit (INDEPENDENT_AMBULATORY_CARE_PROVIDER_SITE_OTHER): Payer: Medicare Other | Admitting: Nurse Practitioner

## 2022-05-29 VITALS — BP 121/84 | HR 60 | Resp 17 | Ht 72.0 in | Wt 297.0 lb

## 2022-05-29 DIAGNOSIS — I890 Lymphedema, not elsewhere classified: Secondary | ICD-10-CM

## 2022-05-29 DIAGNOSIS — I10 Essential (primary) hypertension: Secondary | ICD-10-CM

## 2022-05-29 DIAGNOSIS — M7989 Other specified soft tissue disorders: Secondary | ICD-10-CM

## 2022-05-29 DIAGNOSIS — L03115 Cellulitis of right lower limb: Secondary | ICD-10-CM | POA: Diagnosis not present

## 2022-05-29 NOTE — Progress Notes (Signed)
[name] : [id number]  Warren Leblanc is a 77 y.o. (13-Nov-1945) male who presents with chief complaint of  Chief Complaint  Patient presents with   Follow-up  .  History of Present Illness: Patient returns today in follow up of leg swelling and lymphedema.  No major changes. Leg swelling about the same.  Current Outpatient Medications  Medication Sig Dispense Refill   Ascorbic Acid (VITAMIN C) 1000 MG tablet Take by mouth.     Biotin 2.5 MG TABS Take by mouth.     Cyanocobalamin (VITAMIN B 12 PO) Take by mouth.     folic acid (FOLVITE) 1 MG tablet Take by mouth.     lisinopril-hydrochlorothiazide (ZESTORETIC) 20-12.5 MG tablet Take 2 tablets by mouth daily.      MAGNESIUM OXIDE PO Take by mouth.     Niacinamide-Zn-Cu-Methfo-Se-Cr (NICOTINAMIDE PO) Take 500 mg by mouth in the morning and at bedtime.     Potassium 99 MG TABS Take by mouth.     TURMERIC PO Take by mouth.     UNABLE TO FIND [place]     No current facility-administered medications for this visit.    Past Medical History:  Diagnosis Date   Hypertension     No past surgical history on file.   Social History   Tobacco Use   Smoking status: Some Days   Smokeless tobacco: Never  Substance Use Topics   Alcohol use: No   Drug use: No      No family history on file.   Allergies  Allergen Reactions   Amoxicillin    Amoxicillin-Pot Clavulanate    Antihistamines, Chlorpheniramine-Type Nausea Only   Diphenhydramine Hcl     Other reaction(s): Other (See Comments) drowsy     REVIEW OF SYSTEMS (Negative unless checked)   Constitutional: Weight loss  Fever  Chills Cardiac: Chest pain   Chest pressure   Palpitations   Shortness of breath when laying flat   Shortness of breath at rest   Shortness of breath with exertion. Vascular:  Pain in legs with walking   Pain in legs at rest   Pain in legs when laying flat   Claudication   Pain in feet when walking  Pain in feet at rest   Pain in feet when laying flat   History of DVT   Phlebitis   Swelling in legs   Varicose veins   Non-healing ulcers Pulmonary:   Uses home oxygen   Productive cough   Hemoptysis   Wheeze  COPD   Asthma Neurologic:  Dizziness  Blackouts   Seizures   History of stroke   History of TIA  Aphasia   Temporary blindness   Dysphagia   Weakness or numbness in arms   Weakness or numbness in legs Musculoskeletal:  Arthritis   Joint swelling   Joint pain   Low back pain Hematologic:  Easy bruising  Easy bleeding   Hypercoagulable state   Anemic   Gastrointestinal:  Blood in stool   Vomiting blood  Gastroesophageal reflux/heartburn   Abdominal pain Genitourinary:  Chronic kidney disease   Difficult urination  Frequent urination  Burning with urination   Hematuria Skin:  Rashes   Ulcers   Wounds Psychological:  History of anxiety    History of major depression   Physical Examination  BP 121/84 (BP Location: Right Arm)   Pulse 60   Resp 17   Ht 6' (1.829 m)   Wt 297 lb (134.7 kg)  BMI 40.28 kg/m  Gen:  WD/WN, NAD Head: [place]/AT, No temporalis wasting. Ear/Nose/Throat: Hearing grossly intact, nares w/o erythema or drainage Eyes: Conjunctiva clear. Sclera non-icteric Neck: Supple.  Trachea midline Pulmonary:  Good air movement, no use of accessory muscles.  Cardiac: RRR, no JVD Vascular:  Vessel Right Left  Radial Palpable Palpable               Musculoskeletal: M/S 5/5 throughout.  No deformity or atrophy. 1-2+ BLE edema. Neurologic: Sensation grossly intact in extremities.  Symmetrical.  Speech is fluent.  Psychiatric: Judgment intact, Mood & affect appropriate for pt's clinical situation. Dermatologic: No rashes or ulcers noted.  No cellulitis or open wounds.      Labs No results found for this or any previous visit (from the past 2160 hour(s)).  Radiology No results  found.  Assessment/Plan  Essential hypertension, benign blood pressure control important in reducing the progression of atherosclerotic disease. On appropriate oral medications.     Cellulitis of leg, right Several months ago, had a course of amoxil, improved   Swelling of limb Stable, mild improvement.  Remains significant   Lymphedema Stable with slight improvement.  Continue current measures.  Return to clinic to see me in a month or so.  Weekly Unna boots. A three layer UNNA boot was placed on both LE today.   Festus Barren, MD  05/29/2022 11:40 AM    This note was created with Dragon medical transcription system.  Any errors from dictation are purely unintentional

## 2022-06-05 ENCOUNTER — Encounter (INDEPENDENT_AMBULATORY_CARE_PROVIDER_SITE_OTHER): Payer: Self-pay | Admitting: Nurse Practitioner

## 2022-06-05 ENCOUNTER — Ambulatory Visit (INDEPENDENT_AMBULATORY_CARE_PROVIDER_SITE_OTHER): Payer: Medicare Other | Admitting: Nurse Practitioner

## 2022-06-05 VITALS — BP 118/76 | HR 67 | Resp 16 | Wt 297.0 lb

## 2022-06-05 DIAGNOSIS — I890 Lymphedema, not elsewhere classified: Secondary | ICD-10-CM

## 2022-06-05 NOTE — Progress Notes (Signed)
History of Present Illness  There is no documented history at this time  Assessments & Plan   There are no diagnoses linked to this encounter.    Additional instructions  Subjective:  Patient presents with venous ulcer of the Bilateral lower extremity.    Procedure:  3 layer unna wrap was placed Bilateral lower extremity.   Plan:   Follow up in one week.  

## 2022-06-06 ENCOUNTER — Encounter (INDEPENDENT_AMBULATORY_CARE_PROVIDER_SITE_OTHER): Payer: Self-pay | Admitting: Nurse Practitioner

## 2022-06-11 ENCOUNTER — Ambulatory Visit
Payer: Medicare Other | Attending: Student in an Organized Health Care Education/Training Program | Admitting: Student in an Organized Health Care Education/Training Program

## 2022-06-11 ENCOUNTER — Encounter: Payer: Self-pay | Admitting: Student in an Organized Health Care Education/Training Program

## 2022-06-11 VITALS — BP 134/85 | HR 73 | Temp 97.9°F | Resp 16 | Ht 72.0 in | Wt 280.0 lb

## 2022-06-11 DIAGNOSIS — M19012 Primary osteoarthritis, left shoulder: Secondary | ICD-10-CM | POA: Diagnosis not present

## 2022-06-11 DIAGNOSIS — G5690 Unspecified mononeuropathy of unspecified upper limb: Secondary | ICD-10-CM

## 2022-06-11 MED ORDER — DEXAMETHASONE SODIUM PHOSPHATE 10 MG/ML IJ SOLN
10.0000 mg | Freq: Once | INTRAMUSCULAR | Status: AC
  Administered 2022-06-11: 10 mg

## 2022-06-11 MED ORDER — LIDOCAINE HCL 2 % IJ SOLN
20.0000 mL | Freq: Once | INTRAMUSCULAR | Status: AC
  Administered 2022-06-11: 400 mg

## 2022-06-11 MED ORDER — ROPIVACAINE HCL 2 MG/ML IJ SOLN
4.0000 mL | Freq: Once | INTRAMUSCULAR | Status: AC
  Administered 2022-06-11: 4 mL via INTRA_ARTICULAR

## 2022-06-11 MED ORDER — DEXAMETHASONE SODIUM PHOSPHATE 10 MG/ML IJ SOLN
INTRAMUSCULAR | Status: AC
  Filled 2022-06-11: qty 1

## 2022-06-11 MED ORDER — LIDOCAINE HCL 2 % IJ SOLN
INTRAMUSCULAR | Status: AC
  Filled 2022-06-11: qty 20

## 2022-06-11 MED ORDER — ROPIVACAINE HCL 2 MG/ML IJ SOLN
INTRAMUSCULAR | Status: AC
  Filled 2022-06-11: qty 20

## 2022-06-11 NOTE — Patient Instructions (Signed)

## 2022-06-11 NOTE — Progress Notes (Signed)
PROVIDER NOTE: Interpretation of information contained herein should be left to medically-trained personnel. Specific patient instructions are provided elsewhere under "Patient Instructions" section of medical record. This document was created in part using STT-dictation technology, any transcriptional errors that may result from this process are unintentional.  Patient: Warren Leblanc Type: Established DOB: October 26, 1945 [name]: [id number] PCP: Harlow Mares, MD  Service: Procedure DOS: 06/11/2022 Setting: Ambulatory Location: Ambulatory outpatient facility Delivery: Face-to-face Provider: Edward Jolly, MD Specialty: Interventional Pain Management Specialty designation: 09 Location: Outpatient facility Ref. Prov.: Harlow Mares, MD       Interventional Therapy   Procedure Axillary Nerve block  #1 Laterality:  Left  Level: Middle of deltoid muscle Imaging: Ultrasound-guided         Anesthesia: Local anesthesia (1-2% Lidocaine) DOS: 06/11/2022  Performed by: Edward Jolly, MD  Purpose: Diagnostic/Therapeutic Indications: Shoulder pain, severe enough to impact quality of life and/or function. 1. Arthritis of left glenohumeral joint   2. Neuropathy, axillary nerve    NAS-11 score:   Pre-procedure: 9 /10   Post-procedure: 8  (when moving arm backwards, 0 when not moving)/10     Target: Axillary nerve Location: Approximately 6 cm below the acromion within the middle third of the deltoid muscle bounded on the bottom by the insertion of the deltoid muscle into the infra to the axilla Approach: Percutaneous  Rationale (medical necessity): procedure needed and proper for the diagnosis and/or treatment of the patient's medical symptoms and needs.  Position / Prep / Materials:  Position: Sitting Materials:  Tray: Block Needle(s):  Type: Spinal  Gauge (G): 22  Length: 3.5 in.  Qty: 1 Prep solution: DuraPrep (Iodine Povacrylex [0.7% available iodine] and Isopropyl Alcohol, 74% w/w) Prep  Area: Entire posterior shoulder area. From upper spine to shoulder proper (upper arm), and from lateral neck to lower tip of shoulder blade.   Pre-op H&P Assessment:  Warren Leblanc is a 77 y.o. (year old), male patient, seen today for interventional treatment. He  has no past surgical history on file. Warren Leblanc has a current medication list which includes the following prescription(s): vitamin c, biotin, cyanocobalamin, folic acid, lisinopril-hydrochlorothiazide, magnesium oxide, niacinamide-zn-cu-methfo-se-cr, potassium, turmeric, and UNABLE TO FIND. His primarily concern today is the Shoulder Pain (left)  Initial Vital Signs:  Pulse/HCG Rate: 73  Temp: 97.9 F (36.6 C) Resp: 16 BP: 134/85 SpO2: 99 %  BMI: Estimated body mass index is 37.97 kg/m as calculated from the following:   Height as of this encounter: 6' (1.829 m).   Weight as of this encounter: 280 lb (127 kg).  Risk Assessment: Allergies: Reviewed. He is allergic to amoxicillin; amoxicillin-pot clavulanate; antihistamines, chlorpheniramine-type; and diphenhydramine hcl.  Allergy Precautions: None required Coagulopathies: Reviewed. None identified.  Blood-thinner therapy: None at this time Active Infection(s): Reviewed. None identified. Warren Leblanc is afebrile  Site Confirmation: Warren Leblanc was asked to confirm the procedure and laterality before marking the site Procedure checklist: Completed Consent: Before the procedure and under the influence of no sedative(s), amnesic(s), or anxiolytics, the patient was informed of the treatment options, risks and possible complications. To fulfill our ethical and legal obligations, as recommended by the American Medical Association's Code of Ethics, I have informed the patient of my clinical impression; the nature and purpose of the treatment or procedure; the risks, benefits, and possible complications of the intervention; the alternatives, including doing nothing; the risk(s) and benefit(s) of  the alternative treatment(s) or procedure(s); and the risk(s) and benefit(s) of doing nothing. The  patient was provided information about the general risks and possible complications associated with the procedure. These may include, but are not limited to: failure to achieve desired goals, infection, bleeding, organ or nerve damage, allergic reactions, paralysis, and death. In addition, the patient was informed of those risks and complications associated to the procedure, such as failure to decrease pain; infection; bleeding; organ or nerve damage with subsequent damage to sensory, motor, and/or autonomic systems, resulting in permanent pain, numbness, and/or weakness of one or several areas of the body; allergic reactions; (i.e.: anaphylactic reaction); and/or death. Furthermore, the patient was informed of those risks and complications associated with the medications. These include, but are not limited to: allergic reactions (i.e.: anaphylactic or anaphylactoid reaction(s)); adrenal axis suppression; blood sugar elevation that in diabetics may result in ketoacidosis or comma; water retention that in patients with history of congestive heart failure may result in shortness of breath, pulmonary edema, and decompensation with resultant heart failure; weight gain; swelling or edema; medication-induced neural toxicity; particulate matter embolism and blood vessel occlusion with resultant organ, and/or nervous system infarction; and/or aseptic necrosis of one or more joints. Finally, the patient was informed that Medicine is not an exact science; therefore, there is also the possibility of unforeseen or unpredictable risks and/or possible complications that may result in a catastrophic outcome. The patient indicated having understood very clearly. We have given the patient no guarantees and we have made no promises. Enough time was given to the patient to ask questions, all of which were answered to the patient's  satisfaction. Warren Leblanc has indicated that he wanted to continue with the procedure. Attestation: I, the ordering provider, attest that I have discussed with the patient the benefits, risks, side-effects, alternatives, likelihood of achieving goals, and potential problems during recovery for the procedure that I have provided informed consent. Date  Time: 06/11/2022 10:31 AM  Pre-Procedure Preparation:  Monitoring: As per clinic protocol. Respiration, ETCO2, SpO2, BP, heart rate and rhythm monitor placed and checked for adequate function Safety Precautions: Patient was assessed for positional comfort and pressure points before starting the procedure. Time-out: I initiated and conducted the "Time-out" before starting the procedure, as per protocol. The patient was asked to participate by confirming the accuracy of the "Time Out" information. Verification of the correct person, site, and procedure were performed and confirmed by me, the nursing staff, and the patient. "Time-out" conducted as per Joint Commission's Universal Protocol (UP.01.01.01). Time: 1105 Start Time: 1106 hrs.  Description of Procedure:          Procedural Technique Safety Precautions: Aspiration looking for blood return was conducted prior to all injections. At no point did we inject any substances, as a needle was being advanced. No attempts were made at seeking any paresthesias. Safe injection practices and needle disposal techniques used. Medications properly checked for expiration dates. SDV (single dose vial) medications used. Description of the Procedure: Protocol guidelines were followed. The patient was placed in position over the procedure table. The target area was identified and the area prepped in the usual manner. Skin & deeper tissues infiltrated with local anesthetic. Appropriate amount of time allowed to pass for local anesthetics to take effect. The procedure needles were then advanced to the target area. Proper needle  placement secured. Negative aspiration confirmed. Solution injected in intermittent fashion, asking for systemic symptoms every 0.5cc of injectate. The needles were then removed and the area cleansed, making sure to leave some of the prepping solution back to take advantage  of its long term bactericidal properties.  Spinal needle was inserted under ultrasound guidance 6 cm below the acromion within the middle third of the deltoid muscle.  5 cc solution made of 4 cc of 0.2% ropivacaine, 1 cc of Decadron 10 mg/cc was injected along the axillary nerve  Vitals:   06/11/22 1033  BP: 134/85  Pulse: 73  Resp: 16  Temp: 97.9 F (36.6 C)  SpO2: 99%  Weight: 280 lb (127 kg)  Height: 6' (1.829 m)     Start Time: 1106 hrs. End Time: 1109 hrs.  Imaging Guidance (non Spinal):          Type of Imaging Technique: Ultrasound guidance Indication(s): Assistance in needle guidance and placement for procedures requiring needle placement in or near specific anatomical locations not easily accessible without such assistance. Contrast: None used.  Antibiotic Prophylaxis:   Anti-infectives (From admission, onward)    None      Indication(s): None identified  Post-operative Assessment:  Post-procedure Vital Signs:  Pulse/HCG Rate: 73  Temp: 97.9 F (36.6 C) Resp: 16 BP: 134/85 SpO2: 99 %  EBL: None  Complications: No immediate post-treatment complications observed by team, or reported by patient.  Note: The patient tolerated the entire procedure well. A repeat set of vitals were taken after the procedure and the patient was kept under observation following institutional policy, for this type of procedure. Post-procedural neurological assessment was performed, showing return to baseline, prior to discharge. The patient was provided with post-procedure discharge instructions, including a section on how to identify potential problems. Should any problems arise concerning this procedure, the patient  was given instructions to immediately contact us, at any time, without hesitation. In any case, we plan to contact the patient by telephone for a follow-up status report regarding this interventional procedure.  Comments:  No additional relevant information.  Plan of Care (POC)  Orders:  No orders of the defined types were placed in this encounter.    Medications ordered for procedure: Meds ordered this encounter  Medications   lidocaine (XYLOCAINE) 2 % (with pres) injection 400 mg   dexamethasone (DECADRON) injection 10 mg   ropivacaine (PF) 2 mg/mL (0.2%) (NAROPIN) injection 4 mL   Medications administered: We administered lidocaine, dexamethasone, and ropivacaine (PF) 2 mg/mL (0.2%).  See the medical record for exact dosing, route, and time of administration.  Follow-up plan:   Return in about 4 weeks (around 07/09/2022) for PPE  F2F.       Recent Visits Date Type Provider Dept  05/09/22 Office Visit Edward Jolly, MD [facility]  04/16/22 Procedure visit Edward Jolly, MD [facility]  03/15/22 Office Visit Edward Jolly, MD [facility]  Showing recent visits within past 90 days and meeting all other requirements Today's Visits Date Type Provider Dept  06/11/22 Procedure visit Edward Jolly, MD [facility]  Showing today's visits and meeting all other requirements Future Appointments Date Type Provider Dept  07/09/22 Appointment Edward Jolly, MD [facility]  Showing future appointments within next 90 days and meeting all other requirements  Disposition: Discharge home  Discharge (Date  Time): 06/11/2022; 1116 hrs.   Primary Care Physician: Harlow Mares, MD Location: [facility] Note by: Edward Jolly, MD (TTS technology used. I apologize for any typographical errors that were not detected and corrected.) Date: 06/11/2022; Time: 1:07 PM  Disclaimer:  Medicine is not an Visual merchandiser. The  only guarantee in medicine is that nothing is  guaranteed. It is important to note that the decision to proceed with this intervention was based on the information collected from the patient. The Data and conclusions were drawn from the patient's questionnaire, the interview, and the physical examination. Because the information was provided in large part by the patient, it cannot be guaranteed that it has not been purposely or unconsciously manipulated. Every effort has been made to obtain as much relevant data as possible for this evaluation. It is important to note that the conclusions that lead to this procedure are derived in large part from the available data. Always take into account that the treatment will also be dependent on availability of resources and existing treatment guidelines, considered by other Pain Management Practitioners as being common knowledge and practice, at the time of the intervention. For Medico-Legal purposes, it is also important to point out that variation in procedural techniques and pharmacological choices are the acceptable norm. The indications, contraindications, technique, and results of the above procedure should only be interpreted and judged by a Board-Certified Interventional Pain Specialist with extensive familiarity and expertise in the same exact procedure and technique.

## 2022-06-11 NOTE — Progress Notes (Signed)
Safety precautions to be maintained throughout the outpatient stay will include: orient to surroundings, keep bed in low position, maintain call bell within reach at all times, provide assistance with transfer out of bed and ambulation.  

## 2022-06-12 ENCOUNTER — Telehealth: Payer: Self-pay | Admitting: *Deleted

## 2022-06-12 ENCOUNTER — Ambulatory Visit (INDEPENDENT_AMBULATORY_CARE_PROVIDER_SITE_OTHER): Payer: Medicare Other | Admitting: Nurse Practitioner

## 2022-06-12 ENCOUNTER — Encounter (INDEPENDENT_AMBULATORY_CARE_PROVIDER_SITE_OTHER): Payer: Self-pay | Admitting: Nurse Practitioner

## 2022-06-12 VITALS — BP 122/83 | HR 93 | Resp 17 | Ht 72.0 in | Wt 297.0 lb

## 2022-06-12 DIAGNOSIS — I890 Lymphedema, not elsewhere classified: Secondary | ICD-10-CM

## 2022-06-12 NOTE — Progress Notes (Signed)
History of Present Illness  There is no documented history at this time  Assessments & Plan   There are no diagnoses linked to this encounter.    Additional instructions  Subjective:  Patient presents with venous ulcer of the Bilateral lower extremity.    Procedure:  3 layer unna wrap was placed Bilateral lower extremity.   Plan:   Follow up in one week.  

## 2022-06-12 NOTE — Telephone Encounter (Signed)
No problems post procedure. 

## 2022-06-19 ENCOUNTER — Encounter (INDEPENDENT_AMBULATORY_CARE_PROVIDER_SITE_OTHER): Payer: Medicare Other

## 2022-06-20 ENCOUNTER — Ambulatory Visit (INDEPENDENT_AMBULATORY_CARE_PROVIDER_SITE_OTHER): Payer: Medicare Other | Admitting: Nurse Practitioner

## 2022-06-20 ENCOUNTER — Encounter (INDEPENDENT_AMBULATORY_CARE_PROVIDER_SITE_OTHER): Payer: Self-pay | Admitting: Nurse Practitioner

## 2022-06-20 VITALS — BP 127/83 | HR 70 | Resp 16 | Wt 299.0 lb

## 2022-06-20 DIAGNOSIS — I890 Lymphedema, not elsewhere classified: Secondary | ICD-10-CM | POA: Diagnosis not present

## 2022-06-20 NOTE — Progress Notes (Signed)
History of Present Illness  There is no documented history at this time  Assessments & Plan   There are no diagnoses linked to this encounter.    Additional instructions  Subjective:  Patient presents with venous ulcer of the Bilateral lower extremity.    Procedure:  3 layer unna wrap was placed Bilateral lower extremity.   Plan:   Follow up in one week.  

## 2022-06-26 ENCOUNTER — Encounter (INDEPENDENT_AMBULATORY_CARE_PROVIDER_SITE_OTHER): Payer: Medicare Other

## 2022-06-27 ENCOUNTER — Encounter (INDEPENDENT_AMBULATORY_CARE_PROVIDER_SITE_OTHER): Payer: Self-pay

## 2022-06-27 ENCOUNTER — Ambulatory Visit (INDEPENDENT_AMBULATORY_CARE_PROVIDER_SITE_OTHER): Payer: Medicare Other | Admitting: Nurse Practitioner

## 2022-06-27 VITALS — BP 132/83 | HR 70 | Resp 16 | Wt 296.0 lb

## 2022-06-27 DIAGNOSIS — I890 Lymphedema, not elsewhere classified: Secondary | ICD-10-CM

## 2022-06-27 NOTE — Progress Notes (Signed)
History of Present Illness  There is no documented history at this time  Assessments & Plan   There are no diagnoses linked to this encounter.    Additional instructions  Subjective:  Patient presents with venous ulcer of the Bilateral lower extremity.    Procedure:  3 layer unna wrap was placed Bilateral lower extremity.   Plan:   Follow up in one week.  

## 2022-07-03 ENCOUNTER — Ambulatory Visit (INDEPENDENT_AMBULATORY_CARE_PROVIDER_SITE_OTHER): Payer: Medicare Other | Admitting: Nurse Practitioner

## 2022-07-03 ENCOUNTER — Encounter (INDEPENDENT_AMBULATORY_CARE_PROVIDER_SITE_OTHER): Payer: Self-pay

## 2022-07-03 VITALS — BP 139/79 | HR 65 | Resp 16 | Wt 297.0 lb

## 2022-07-03 DIAGNOSIS — I890 Lymphedema, not elsewhere classified: Secondary | ICD-10-CM | POA: Diagnosis not present

## 2022-07-03 NOTE — Progress Notes (Signed)
History of Present Illness  There is no documented history at this time  Assessments & Plan   There are no diagnoses linked to this encounter.    Additional instructions  Subjective:  Patient presents with venous ulcer of the Bilateral lower extremity.    Procedure:  3 layer unna wrap was placed Bilateral lower extremity.   Plan:   Follow up in one week.  

## 2022-07-09 ENCOUNTER — Encounter: Payer: Self-pay | Admitting: Student in an Organized Health Care Education/Training Program

## 2022-07-09 ENCOUNTER — Ambulatory Visit
Payer: Medicare Other | Attending: Student in an Organized Health Care Education/Training Program | Admitting: Student in an Organized Health Care Education/Training Program

## 2022-07-09 VITALS — BP 118/77 | HR 66 | Temp 97.9°F | Resp 18 | Ht 72.0 in | Wt 283.0 lb

## 2022-07-09 DIAGNOSIS — M19012 Primary osteoarthritis, left shoulder: Secondary | ICD-10-CM | POA: Diagnosis present

## 2022-07-09 DIAGNOSIS — M67814 Other specified disorders of tendon, left shoulder: Secondary | ICD-10-CM

## 2022-07-09 DIAGNOSIS — G894 Chronic pain syndrome: Secondary | ICD-10-CM

## 2022-07-09 DIAGNOSIS — G5690 Unspecified mononeuropathy of unspecified upper limb: Secondary | ICD-10-CM | POA: Insufficient documentation

## 2022-07-09 NOTE — Progress Notes (Signed)
Safety precautions to be maintained throughout the outpatient stay will include: orient to surroundings, keep bed in low position, maintain call bell within reach at all times, provide assistance with transfer out of bed and ambulation.  

## 2022-07-09 NOTE — Progress Notes (Signed)
PROVIDER NOTE: Information contained herein reflects review and annotations entered in association with encounter. Interpretation of such information and data should be left to medically-trained personnel. Information provided to patient can be located elsewhere in the medical record under "Patient Instructions". Document created using STT-dictation technology, any transcriptional errors that may result from process are unintentional.    Patient: Warren Leblanc  Service Category: E/M  Provider: Edward Jolly, MD  DOB: 04-Jan-1946  DOS: 07/09/2022  Referring Provider: Harlow Mares, MD  [name]: [id number]  Specialty: Interventional Pain Management  PCP: Harlow Mares, MD  Type: Established Patient  Setting: Ambulatory outpatient    Location: Office  Delivery: Face-to-face     HPI  Mr. Warren Leblanc, a 77 y.o. 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 Mr. Warren Leblanc primary complain today is Shoulder Pain (left) and Arm Pain (left)  Pertinent problems: Warren Leblanc does not have any pertinent problems on file. Pain Assessment: Severity of Chronic pain is reported as a 9 /10. Location: Shoulder Left/has gone down to wrist. Onset: 1 to 4 weeks ago. Quality: Aching, Constant. Timing: Constant. Modifying factor(s): cannot move left arm without pain, "pain wakes me up in the morning". Vitals:  height is 6' (1.829 m) and weight is 283 lb (128.4 kg). His temporal temperature is 97.9 F (36.6 C). His blood pressure is 118/77 and his pulse is 66. His respiration is 18 and oxygen saturation is 96%.  BMI: Estimated body mass index is 38.38 kg/m as calculated from the following:   Height as of this encounter: 6' (1.829 m).   Weight as of this encounter: 283 lb (128.4 kg). Last encounter: 05/09/2022. Last procedure: 06/11/2022.  Reason for encounter: post-procedure evaluation and assessment.    Post-procedure evaluation   Procedure Axillary Nerve block  #1 Laterality:   Left  Level: Middle of deltoid muscle Imaging: Ultrasound-guided         Anesthesia: Local anesthesia (1-2% Lidocaine) DOS: 06/11/2022  Performed by: Edward Jolly, MD  Purpose: Diagnostic/Therapeutic Indications: Shoulder pain, severe enough to impact quality of life and/or function. 1. Arthritis of left glenohumeral joint   2. Neuropathy, axillary nerve    NAS-11 score:   Pre-procedure: 9 /10   Post-procedure: 8  (when moving arm backwards, 0 when not moving)/10      Effectiveness:  Initial hour after procedure: 90 %  Subsequent 4-6 hours post-procedure: 10 %  Analgesia past initial 6 hours: 0 %  Ongoing improvement:  Analgesic:  0% Function: No benefit ROM: No benefit    Constitutional: Denies any fever or chills Gastrointestinal: No reported hemesis, hematochezia, vomiting, or acute GI distress Musculoskeletal:  left shoulder and arm pain Neurological: No reported episodes of acute onset apraxia, aphasia, dysarthria, agnosia, amnesia, paralysis, loss of coordination, or loss of consciousness  Medication Review  Biotin, Cyanocobalamin, Magnesium Oxide, Niacinamide-Zn-Cu-Methfo-Se-Cr, Potassium, Turmeric, UNABLE TO FIND, folic acid, lisinopril-hydrochlorothiazide, and vitamin C  History Review  Allergy: Warren Leblanc is allergic to amoxicillin; amoxicillin-pot clavulanate; antihistamines, chlorpheniramine-type; and diphenhydramine hcl. Drug: Warren Leblanc  reports no history of drug use. Alcohol:  reports no history of alcohol use. Tobacco:  reports that he has been smoking. He has never used smokeless tobacco. Social: Warren Leblanc  reports that he has been smoking. He has never used smokeless tobacco. He reports that he does not drink alcohol and does not use drugs. Medical:  has a past medical history of Hypertension. Surgical: Warren Leblanc  has  no past surgical history on file. Family: family history is not on file.  Laboratory Chemistry Profile   Renal No results found for:  "BUN", "CREATININE", "LABCREA", "BCR", "GFR", "GFRAA", "GFRNONAA", "LABVMA", "EPIRU", "EPINEPH24HUR", "NOREPRU", "NOREPI24HUR", "DOPARU", "DOPAM24HRUR"  Hepatic No results found for: "AST", "ALT", "ALBUMIN", "ALKPHOS", "HCVAB", "AMYLASE", "LIPASE", "AMMONIA"  Electrolytes No results found for: "NA", "K", "CL", "CALCIUM", "MG", "PHOS"  Bone No results found for: "VD25OH", "VD125OH2TOT", "JJ8841YS0", "YT0160FU9", "25OHVITD1", "25OHVITD2", "25OHVITD3", "TESTOFREE", "TESTOSTERONE"  Inflammation (CRP: Acute Phase) (ESR: Chronic Phase) No results found for: "CRP", "ESRSEDRATE", "LATICACIDVEN"       Note: Above Lab results reviewed.  Recent Imaging Review  MR SHOULDER LEFT WO CONTRAST CLINICAL DATA:  Larey Seat 06/01/2020.  Humerus fracture.  EXAM: MRI OF THE LEFT SHOULDER WITHOUT CONTRAST  TECHNIQUE: Multiplanar, multisequence MR imaging of the shoulder was performed. No intravenous contrast was administered.  COMPARISON:  Left shoulder radiographs 06/01/2020  FINDINGS: Rotator cuff: Mild intermediate T2 signal posterior supraspinatus and anterior infraspinatus tendinosis. Mild linear fluid bright signal along the longitudinal dimension of the superior subscapularis tendon (sagittal series 8 images 8 through 11), a small partial-thickness interstitial tear. The teres minor is intact.  Muscles: No rotator cuff muscle atrophy, fatty infiltration, or edema.  Biceps long head: The intra-articular long head of the biceps tendon is intact.  Acromioclavicular Joint: There are moderate degenerative changes of the acromioclavicular joint including joint space narrowing, subchondral marrow edema, and peripheral osteophytosis. Type II acromion. No subacromial/subdeltoid bursitis.  Glenohumeral Joint: Moderate thinning of the anterior glenoid and inferior medial humeral head cartilage. High-grade thinning of the anterior glenoid cartilage with the anterior inferior glenoid subchondral marrow  edema. Possible nondisplaced anterior inferior glenoid fracture line (sagittal series 9, image 5).  Mild glenohumeral joint effusion with multiple tiny anterior inferior low signal foci, likely small loose bodies.  Labrum: Intermediate T2 signal and thickening degenerative irregularity of the anterior inferior glenoid labrum.  Bones: There are diffuse decreased T1 increased T2 signal fluid bright fracture lines corresponding to the previously seen markedly comminuted fracture involving the majority of the humeral head and extending into the anterior and lateral aspects of the surgical neck. There is up to 10 mm lateral cortical step-off of the inferolateral aspect of the humeral head (axial series 5, image 19). There is approximately 5 mm medial displacement of the distal fracture component (humeral shaft) with respect to the proximal fracture component (humeral head). The anteromedial humeral head fracture lines extend to the anteromedial cartilage surface (axial images 15 through 18).  Other: None.  IMPRESSION:: IMPRESSION: 1. Markedly comminuted acute fracture of the humeral head and surgical neck with 5 mm medial displacement of the distal fracture component of the humeral shaft. 2. Moderate anterior inferior glenoid cartilage degenerative changes with subchondral marrow edema. Possible nondisplaced anterior inferior glenoid fracture line. 3. Mild posterior supraspinatus and anterior infraspinatus tendinosis. Tiny linear superior subscapularis longitudinal midsubstance tear without tendon retraction. 4. Moderate degenerative changes of the acromioclavicular joint.  Electronically Signed   By: Neita Garnet M.D.   On: 06/02/2021 10:35 Note: Reviewed        Physical Exam  General appearance: Well nourished, well developed, and well hydrated. In no apparent acute distress Mental status: Alert, oriented x 3 (person, place, & time)       Respiratory: No evidence of acute  respiratory distress Eyes: PERLA Vitals: BP 118/77 (BP Location: Right Arm, Patient Position: Sitting, Cuff Size: Large)   Pulse 66   Temp 97.9 F (36.6 C) (  Temporal)   Resp 18   Ht 6' (1.829 m)   Wt 283 lb (128.4 kg)   SpO2 96%   BMI 38.38 kg/m  BMI: Estimated body mass index is 38.38 kg/m as calculated from the following:   Height as of this encounter: 6' (1.829 m).   Weight as of this encounter: 283 lb (128.4 kg). Ideal: Ideal body weight: 77.6 kg (171 lb 1.2 oz) Adjusted ideal body weight: 97.9 kg (215 lb 13.5 oz)  Persistent left shoulder and bicep pain.  Limited range of motion.  Assessment   Diagnosis Status  1. Arthritis of left glenohumeral joint   2. Neuropathy, axillary nerve   3. Tendinosis of left rotator cuff   4. Chronic pain syndrome    Persistent Persistent Persistent   Updated Problems: No problems updated.  Plan of Care  Unfortunately, no benefit with axillary nerve block or suprascapular nerve block.  Limited interventional options at this time.  Patient can consider acupuncture and continue with his turmeric and acetaminophen as needed.   Follow-up plan:   No follow-ups on file.      Recent Visits Date Type Provider Dept  06/11/22 Procedure visit Edward Jolly, MD [facility]  05/09/22 Office Visit Edward Jolly, MD [facility]  04/16/22 Procedure visit Edward Jolly, MD [facility]  Showing recent visits within past 90 days and meeting all other requirements Today's Visits Date Type Provider Dept  07/09/22 Office Visit Edward Jolly, MD [facility]  Showing today's visits and meeting all other requirements Future Appointments No visits were found meeting these conditions. Showing future appointments within next 90 days and meeting all other requirements  I discussed the assessment and treatment plan with the patient. The patient was provided an opportunity to ask questions and all were answered.  The patient agreed with the plan and demonstrated an understanding of the instructions.  Patient advised to call back or seek an in-person evaluation if the symptoms or condition worsens.  Duration of encounter: .  Total time on encounter, as per AMA guidelines included both the face-to-face and non-face-to-face time personally spent by the physician and/or other qualified health care professional(s) on the day of the encounter (includes time in activities that require the physician or other qualified health care professional and does not include time in activities normally performed by clinical staff). Physician's time may include the following activities when performed: Preparing to see the patient (e.g., pre-charting review of records, searching for previously ordered imaging, lab work, and nerve conduction tests) Review of prior analgesic pharmacotherapies. Reviewing PMP Interpreting ordered tests (e.g., lab work, imaging, nerve conduction tests) Performing post-procedure evaluations, including interpretation of diagnostic procedures Obtaining and/or reviewing separately obtained history Performing a medically appropriate examination and/or evaluation Counseling and educating the patient/family/caregiver Ordering medications, tests, or procedures Referring and communicating with other health care professionals (when not separately reported) Documenting clinical information in the electronic or other health record Independently interpreting results (not separately reported) and communicating results to the patient/ family/caregiver Care coordination (not separately reported)  Note by: Edward Jolly, MD Date: 07/09/2022; Time: 2:51 PM

## 2022-07-10 ENCOUNTER — Ambulatory Visit (INDEPENDENT_AMBULATORY_CARE_PROVIDER_SITE_OTHER): Payer: Medicare Other | Admitting: Vascular Surgery

## 2022-07-10 ENCOUNTER — Encounter (INDEPENDENT_AMBULATORY_CARE_PROVIDER_SITE_OTHER): Payer: Self-pay

## 2022-07-10 VITALS — BP 127/79 | HR 67 | Resp 16 | Wt 301.0 lb

## 2022-07-10 DIAGNOSIS — M79604 Pain in right leg: Secondary | ICD-10-CM

## 2022-07-10 DIAGNOSIS — I890 Lymphedema, not elsewhere classified: Secondary | ICD-10-CM

## 2022-07-10 DIAGNOSIS — I10 Essential (primary) hypertension: Secondary | ICD-10-CM | POA: Diagnosis not present

## 2022-07-10 DIAGNOSIS — M79605 Pain in left leg: Secondary | ICD-10-CM | POA: Diagnosis not present

## 2022-07-10 NOTE — Progress Notes (Signed)
History of Present Illness  There is no documented history at this time  Assessments & Plan   There are no diagnoses linked to this encounter.    Additional instructions  Subjective:  Patient presents with venous ulcer of the Bilateral lower extremity.    Procedure:  3 layer unna wrap was placed Bilateral lower extremity.   Plan:   Follow up in one week.  

## 2022-07-11 ENCOUNTER — Encounter (INDEPENDENT_AMBULATORY_CARE_PROVIDER_SITE_OTHER): Payer: Self-pay | Admitting: Nurse Practitioner

## 2022-07-17 ENCOUNTER — Encounter (INDEPENDENT_AMBULATORY_CARE_PROVIDER_SITE_OTHER): Payer: Self-pay | Admitting: Vascular Surgery

## 2022-07-17 ENCOUNTER — Ambulatory Visit (INDEPENDENT_AMBULATORY_CARE_PROVIDER_SITE_OTHER): Payer: Medicare Other | Admitting: Vascular Surgery

## 2022-07-17 VITALS — BP 139/85 | HR 60 | Resp 16 | Wt 305.0 lb

## 2022-07-17 DIAGNOSIS — E8770 Fluid overload, unspecified: Secondary | ICD-10-CM

## 2022-07-17 DIAGNOSIS — L03115 Cellulitis of right lower limb: Secondary | ICD-10-CM | POA: Diagnosis not present

## 2022-07-17 DIAGNOSIS — R0602 Shortness of breath: Secondary | ICD-10-CM

## 2022-07-17 DIAGNOSIS — M7989 Other specified soft tissue disorders: Secondary | ICD-10-CM

## 2022-07-17 DIAGNOSIS — I10 Essential (primary) hypertension: Secondary | ICD-10-CM

## 2022-07-17 DIAGNOSIS — I890 Lymphedema, not elsewhere classified: Secondary | ICD-10-CM

## 2022-07-17 NOTE — Progress Notes (Signed)
[name] : [id number]  Warren Leblanc is a 77 y.o. (Sep 15, 1945) male who presents with chief complaint of  Chief Complaint  Patient presents with   Follow-up    Unna follow up  .  History of Present Illness: Patient returns today in follow up of his longstanding lymphedema.  He has gained 5 pounds since last week and is up roughly 30 pounds over the past few months.  Swelling is worse.  It is more noticeable in his left arm as well today.  This is the arm he he had an orthopedic injury on a couple of years ago and has significant resultant limitation in activity.  Both legs are more swollen.  He takes potassium and magnesium supplements.  Current Outpatient Medications  Medication Sig Dispense Refill   Ascorbic Acid (VITAMIN C) 1000 MG tablet Take by mouth.     Biotin 2.5 MG TABS Take by mouth.     Cyanocobalamin (VITAMIN B 12 PO) Take by mouth.     folic acid (FOLVITE) 1 MG tablet Take by mouth.     lisinopril-hydrochlorothiazide (ZESTORETIC) 20-12.5 MG tablet Take 2 tablets by mouth daily.      MAGNESIUM OXIDE PO Take by mouth.     Niacinamide-Zn-Cu-Methfo-Se-Cr (NICOTINAMIDE PO) Take 500 mg by mouth in the morning and at bedtime.     Potassium 99 MG TABS Take by mouth.     TURMERIC PO Take by mouth.     UNABLE TO FIND [employer]     No current facility-administered medications for this visit.    Past Medical History:  Diagnosis Date   Hypertension     No past surgical history on file.   Social History   Tobacco Use   Smoking status: Some Days   Smokeless tobacco: Never  Substance Use Topics   Alcohol use: No   Drug use: No      No family history on file.   Allergies  Allergen Reactions   Amoxicillin    Amoxicillin-Pot Clavulanate    Antihistamines, Chlorpheniramine-Type Nausea Only   Diphenhydramine Hcl     Other reaction(s): Other (See Comments) drowsy    REVIEW OF SYSTEMS (Negative unless checked)   Constitutional: [] Weight loss  [] Fever   [] Chills Cardiac: [] Chest pain   [] Chest pressure   [] Palpitations   [] Shortness of breath when laying flat   [] Shortness of breath at rest   [] Shortness of breath with exertion. Vascular:  [x] Pain in legs with walking   [x] Pain in legs at rest   [] Pain in legs when laying flat   [] Claudication   [] Pain in feet when walking  [] Pain in feet at rest  [] Pain in feet when laying flat   [] History of DVT   [] Phlebitis   [x] Swelling in legs   [] Varicose veins   [] Non-healing ulcers Pulmonary:   [] Uses home oxygen   [] Productive cough   [] Hemoptysis   [] Wheeze  [] COPD   [] Asthma Neurologic:  [] Dizziness  [] Blackouts   [] Seizures   [] History of stroke   [] History of TIA  [] Aphasia   [] Temporary blindness   [] Dysphagia   [] Weakness or numbness in arms   [] Weakness or numbness in legs Musculoskeletal:  [x] Arthritis   [] Joint swelling   [x] Joint pain   [] Low back pain Hematologic:  [] Easy bruising  [] Easy bleeding   [] Hypercoagulable state   [] Anemic   Gastrointestinal:  [] Blood in stool   [] Vomiting blood  [] Gastroesophageal reflux/heartburn   [] Abdominal pain Genitourinary:  [] Chronic kidney disease   []   Difficult urination  [] Frequent urination  [] Burning with urination   [] Hematuria Skin:  [x] Rashes   [x] Ulcers   [x] Wounds Psychological:  [] History of anxiety   []  History of major depression  Physical Examination  BP 139/85 (BP Location: Right Arm)   Pulse 60   Resp 16   Wt (!) 305 lb (138.3 kg)   BMI 41.37 kg/m  Gen:  WD/WN, NAD Head: [place]/AT, No temporalis wasting. Ear/Nose/Throat: Hearing grossly intact, nares w/o erythema or drainage Eyes: Conjunctiva clear. Sclera non-icteric Neck: Supple.  Trachea midline Pulmonary:  Good air movement, no use of accessory muscles.  Cardiac: RRR, no JVD Vascular:  Vessel Right Left  Radial Palpable Palpable               Musculoskeletal: M/S 5/5 throughout.  No deformity or atrophy.  Left arm has 2+ swelling.  Bilateral lower extremities have 3+ edema.   Superficial skin breakdown is present more on the right than the left.  Worse than at last visit. Neurologic: Sensation grossly intact in extremities.  Symmetrical.  Speech is fluent.  Psychiatric: Judgment intact, Mood & affect appropriate for pt's clinical situation. Dermatologic: Superficial skin breakdown is present on the lower extremities particular the right anterior lower leg.  This is superficial and clean with no signs of infection.      Labs No results found for this or any previous visit (from the past 2160 hour(s)).  Radiology No results found.  Assessment/Plan Essential hypertension, benign blood pressure control important in reducing the progression of atherosclerotic disease. On appropriate oral medications.     Cellulitis of leg, right Several months ago, had a course of amoxil, improved   Swelling of limb Seems to be worse in both lower extremities as well as the left arm now.  He has had significant weight gain and I am worried about medical issues such as renal insufficiency or congestive heart failure that could be worsening his fluid retention.  I am going to order a chest x-ray and lab work.  I have asked him to discuss this with his primary care physician as well.   Lymphedema His leg swelling is worse today.  I am concerned there could be some medical issues going on that are worsening swelling with marked weight gain over the past few months.  I am going to order a chest x-ray and some laboratory studies for further interrogation.  Return to clinic to see me in a month or so.  Weekly Unna boots. A three layer UNNA boot was placed on both LE today.    Festus Barren, MD  07/17/2022 12:10 PM    This note was created with Dragon medical transcription system.  Any errors from dictation are purely unintentional

## 2022-07-24 ENCOUNTER — Encounter (INDEPENDENT_AMBULATORY_CARE_PROVIDER_SITE_OTHER): Payer: Self-pay | Admitting: Nurse Practitioner

## 2022-07-24 ENCOUNTER — Ambulatory Visit (INDEPENDENT_AMBULATORY_CARE_PROVIDER_SITE_OTHER): Payer: Medicare Other | Admitting: Nurse Practitioner

## 2022-07-24 VITALS — BP 126/84 | HR 68 | Resp 16 | Wt 299.0 lb

## 2022-07-24 DIAGNOSIS — I890 Lymphedema, not elsewhere classified: Secondary | ICD-10-CM

## 2022-07-24 NOTE — Progress Notes (Signed)
History of Present Illness  There is no documented history at this time  Assessments & Plan   There are no diagnoses linked to this encounter.    Additional instructions  Subjective:  Patient presents with venous ulcer of the Bilateral lower extremity.    Procedure:  3 layer unna wrap was placed Bilateral lower extremity.   Plan:   Follow up in one week.  

## 2022-07-31 ENCOUNTER — Encounter (INDEPENDENT_AMBULATORY_CARE_PROVIDER_SITE_OTHER): Payer: Self-pay

## 2022-07-31 ENCOUNTER — Ambulatory Visit (INDEPENDENT_AMBULATORY_CARE_PROVIDER_SITE_OTHER): Payer: Medicare Other | Admitting: Nurse Practitioner

## 2022-07-31 VITALS — BP 129/83 | HR 67 | Resp 16 | Wt 297.0 lb

## 2022-07-31 DIAGNOSIS — I890 Lymphedema, not elsewhere classified: Secondary | ICD-10-CM

## 2022-07-31 NOTE — Progress Notes (Signed)
History of Present Illness  There is no documented history at this time  Assessments & Plan   There are no diagnoses linked to this encounter.    Additional instructions  Subjective:  Patient presents with venous ulcer of the Bilateral lower extremity.    Procedure:  3 layer unna wrap was placed Bilateral lower extremity.   Plan:   Follow up in one week.  

## 2022-08-07 ENCOUNTER — Encounter (INDEPENDENT_AMBULATORY_CARE_PROVIDER_SITE_OTHER): Payer: Self-pay

## 2022-08-07 ENCOUNTER — Ambulatory Visit (INDEPENDENT_AMBULATORY_CARE_PROVIDER_SITE_OTHER): Payer: Medicare Other | Admitting: Nurse Practitioner

## 2022-08-07 VITALS — BP 132/80 | HR 66 | Resp 16 | Wt 297.4 lb

## 2022-08-07 DIAGNOSIS — I890 Lymphedema, not elsewhere classified: Secondary | ICD-10-CM | POA: Diagnosis not present

## 2022-08-07 NOTE — Progress Notes (Unsigned)
History of Present Illness  There is no documented history at this time  Assessments & Plan   There are no diagnoses linked to this encounter.    Additional instructions  Subjective:  Patient presents with venous ulcer of the Bilateral lower extremity.    Procedure:  3 layer unna wrap was placed Bilateral lower extremity.   Plan:   Follow up in one week.  

## 2022-08-08 ENCOUNTER — Encounter (INDEPENDENT_AMBULATORY_CARE_PROVIDER_SITE_OTHER): Payer: Self-pay | Admitting: Nurse Practitioner

## 2022-08-14 ENCOUNTER — Encounter (INDEPENDENT_AMBULATORY_CARE_PROVIDER_SITE_OTHER): Payer: Self-pay | Admitting: Nurse Practitioner

## 2022-08-14 ENCOUNTER — Ambulatory Visit (INDEPENDENT_AMBULATORY_CARE_PROVIDER_SITE_OTHER): Payer: Medicare Other | Admitting: Nurse Practitioner

## 2022-08-14 VITALS — BP 123/77 | HR 63 | Resp 16 | Wt 295.4 lb

## 2022-08-14 DIAGNOSIS — I890 Lymphedema, not elsewhere classified: Secondary | ICD-10-CM

## 2022-08-14 NOTE — Progress Notes (Signed)
History of Present Illness  There is no documented history at this time  Assessments & Plan   There are no diagnoses linked to this encounter.    Additional instructions  Subjective:  Patient presents with venous ulcer of the Bilateral lower extremity.    Procedure:  3 layer unna wrap was placed Bilateral lower extremity.   Plan:   Follow up in one week. 3 .

## 2022-08-21 ENCOUNTER — Encounter (INDEPENDENT_AMBULATORY_CARE_PROVIDER_SITE_OTHER): Payer: Self-pay | Admitting: Nurse Practitioner

## 2022-08-21 ENCOUNTER — Ambulatory Visit (INDEPENDENT_AMBULATORY_CARE_PROVIDER_SITE_OTHER): Payer: Medicare Other | Admitting: Nurse Practitioner

## 2022-08-21 VITALS — BP 122/79 | HR 67 | Resp 16 | Wt 295.0 lb

## 2022-08-21 DIAGNOSIS — I890 Lymphedema, not elsewhere classified: Secondary | ICD-10-CM | POA: Diagnosis not present

## 2022-08-21 NOTE — Progress Notes (Signed)
 History of Present Illness  There is no documented history at this time  Assessments & Plan   There are no diagnoses linked to this encounter.    Additional instructions  Subjective:  Patient presents with venous ulcer of the Bilateral lower extremity.    Procedure:  3 layer unna wrap was placed Bilateral lower extremity.   Plan:   Follow up in one week.  

## 2022-08-28 ENCOUNTER — Ambulatory Visit (INDEPENDENT_AMBULATORY_CARE_PROVIDER_SITE_OTHER): Payer: Medicare Other | Admitting: Vascular Surgery

## 2022-08-28 ENCOUNTER — Encounter (INDEPENDENT_AMBULATORY_CARE_PROVIDER_SITE_OTHER): Payer: Self-pay | Admitting: Vascular Surgery

## 2022-08-28 VITALS — BP 123/83 | HR 71 | Resp 16 | Wt 297.0 lb

## 2022-08-28 DIAGNOSIS — I890 Lymphedema, not elsewhere classified: Secondary | ICD-10-CM

## 2022-08-28 DIAGNOSIS — M7989 Other specified soft tissue disorders: Secondary | ICD-10-CM

## 2022-08-28 NOTE — Progress Notes (Signed)
History of Present Illness  There is no documented history at this time  Assessments & Plan   There are no diagnoses linked to this encounter.    Additional instructions  Subjective:  Patient presents with venous ulcer of the Bilateral lower extremity.    Procedure:  3 layer unna wrap was placed Bilateral lower extremity.   Plan:   Follow up in one week.  

## 2022-09-04 ENCOUNTER — Encounter (INDEPENDENT_AMBULATORY_CARE_PROVIDER_SITE_OTHER): Payer: Self-pay | Admitting: Vascular Surgery

## 2022-09-04 ENCOUNTER — Ambulatory Visit (INDEPENDENT_AMBULATORY_CARE_PROVIDER_SITE_OTHER): Payer: Medicare Other | Admitting: Vascular Surgery

## 2022-09-04 VITALS — BP 126/78 | HR 70 | Resp 16 | Wt 296.0 lb

## 2022-09-04 DIAGNOSIS — I890 Lymphedema, not elsewhere classified: Secondary | ICD-10-CM

## 2022-09-04 DIAGNOSIS — I10 Essential (primary) hypertension: Secondary | ICD-10-CM

## 2022-09-04 DIAGNOSIS — M7989 Other specified soft tissue disorders: Secondary | ICD-10-CM

## 2022-09-04 NOTE — Assessment & Plan Note (Signed)
Swelling is some better today.  Continue weekly Unna boots.  Elevate and activity as tolerated.  Getting his electrolytes under control seems to have been helpful.

## 2022-09-04 NOTE — Progress Notes (Signed)
[name] : [id number]  Warren Leblanc is a 77 y.o. (19-Apr-1945) male who presents with chief complaint of  Chief Complaint  Patient presents with   Follow-up    Unna check  .  History of Present Illness: Patient returns today in follow up of his leg swelling and lymphedema.  His swelling is actually noticeably better today.  He was having what sounds like electrolyte abnormalities which were exacerbating things last time I saw him about 6 or 7 weeks ago.  He still has prominent swelling.  The Unna boot seem to have this under reasonable control at this time.  Current Outpatient Medications  Medication Sig Dispense Refill   Ascorbic Acid (VITAMIN C) 1000 MG tablet Take by mouth.     Biotin 2.5 MG TABS Take by mouth.     Cyanocobalamin (VITAMIN B 12 PO) Take by mouth.     folic acid (FOLVITE) 1 MG tablet Take by mouth.     lisinopril-hydrochlorothiazide (ZESTORETIC) 20-12.5 MG tablet Take 2 tablets by mouth daily.      MAGNESIUM OXIDE PO Take by mouth.     Niacinamide-Zn-Cu-Methfo-Se-Cr (NICOTINAMIDE PO) Take 500 mg by mouth in the morning and at bedtime.     Potassium 99 MG TABS Take by mouth.     TURMERIC PO Take by mouth.     UNABLE TO FIND [employer]     No current facility-administered medications for this visit.    Past Medical History:  Diagnosis Date   Hypertension     No past surgical history on file.   Social History   Tobacco Use   Smoking status: Some Days   Smokeless tobacco: Never  Substance Use Topics   Alcohol use: No   Drug use: No      No family history on file.   Allergies  Allergen Reactions   Amoxicillin    Amoxicillin-Pot Clavulanate    Antihistamines, Chlorpheniramine-Type Nausea Only   Diphenhydramine Hcl     Other reaction(s): Other (See Comments) drowsy    REVIEW OF SYSTEMS (Negative unless checked)   Constitutional: [] Weight loss  [] Fever  [] Chills Cardiac: [] Chest pain   [] Chest pressure   [] Palpitations   [] Shortness of breath  when laying flat   [] Shortness of breath at rest   [] Shortness of breath with exertion. Vascular:  [x] Pain in legs with walking   [x] Pain in legs at rest   [] Pain in legs when laying flat   [] Claudication   [] Pain in feet when walking  [] Pain in feet at rest  [] Pain in feet when laying flat   [] History of DVT   [] Phlebitis   [x] Swelling in legs   [] Varicose veins   [] Non-healing ulcers Pulmonary:   [] Uses home oxygen   [] Productive cough   [] Hemoptysis   [] Wheeze  [] COPD   [] Asthma Neurologic:  [] Dizziness  [] Blackouts   [] Seizures   [] History of stroke   [] History of TIA  [] Aphasia   [] Temporary blindness   [] Dysphagia   [] Weakness or numbness in arms   [] Weakness or numbness in legs Musculoskeletal:  [x] Arthritis   [] Joint swelling   [x] Joint pain   [] Low back pain Hematologic:  [] Easy bruising  [] Easy bleeding   [] Hypercoagulable state   [] Anemic   Gastrointestinal:  [] Blood in stool   [] Vomiting blood  [] Gastroesophageal reflux/heartburn   [] Abdominal pain Genitourinary:  [] Chronic kidney disease   [] Difficult urination  [] Frequent urination  [] Burning with urination   [] Hematuria Skin:  [x] Rashes   [x] Ulcers   [  x]Wounds Psychological:  [] History of anxiety   []  History of major depression  Physical Examination  BP 126/78 (BP Location: Right Arm)   Pulse 70   Resp 16   Wt 296 lb (134.3 kg)   BMI 40.14 kg/m  Gen:  WD/WN, NAD Head: [place]/AT, No temporalis wasting. Ear/Nose/Throat: Hearing grossly intact, nares w/o erythema or drainage Eyes: Conjunctiva clear. Sclera non-icteric Neck: Supple.  Trachea midline Pulmonary:  Good air movement, no use of accessory muscles.  Cardiac: RRR, no JVD Vascular:  Vessel Right Left  Radial Palpable Palpable           Musculoskeletal: M/S 5/5 throughout.  No deformity or atrophy. 2+ BLE edema. Neurologic: Sensation grossly intact in extremities.  Symmetrical.  Speech is fluent.  Psychiatric: Judgment intact, Mood & affect appropriate for pt's  clinical situation. Dermatologic: No rashes or ulcers noted.  No cellulitis or open wounds.     Labs No results found for this or any previous visit (from the past 2160 hour(s)).  Radiology No results found.  Assessment/Plan  Lymphedema Swelling is some better today.  Continue weekly Unna boots.  Elevate and activity as tolerated.  Getting his electrolytes under control seems to have been helpful.  Swelling of limb Swelling is some better today.  Continue weekly Unna boots.  Elevate and activity as tolerated.  Getting his electrolytes under control seems to have been helpful.  Essential hypertension, benign blood pressure control important in reducing the progression of atherosclerotic disease. On appropriate oral medications.    Festus Barren, MD  09/04/2022 12:29 PM    This note was created with Dragon medical transcription system.  Any errors from dictation are purely unintentional

## 2022-09-04 NOTE — Assessment & Plan Note (Signed)
blood pressure control important in reducing the progression of atherosclerotic disease. On appropriate oral medications.  

## 2022-09-11 ENCOUNTER — Ambulatory Visit (INDEPENDENT_AMBULATORY_CARE_PROVIDER_SITE_OTHER): Payer: Medicare Other | Admitting: Nurse Practitioner

## 2022-09-11 ENCOUNTER — Encounter (INDEPENDENT_AMBULATORY_CARE_PROVIDER_SITE_OTHER): Payer: Self-pay

## 2022-09-11 VITALS — BP 126/82 | HR 63 | Resp 16 | Wt 296.8 lb

## 2022-09-11 DIAGNOSIS — I890 Lymphedema, not elsewhere classified: Secondary | ICD-10-CM | POA: Diagnosis not present

## 2022-09-11 NOTE — Progress Notes (Signed)
History of Present Illness  There is no documented history at this time  Assessments & Plan   There are no diagnoses linked to this encounter.    Additional instructions  Subjective:  Patient presents with venous ulcer of the Bilateral lower extremity.    Procedure:  3 layer unna wrap was placed Bilateral lower extremity.   Plan:   Follow up in one week.  

## 2022-09-18 ENCOUNTER — Encounter (INDEPENDENT_AMBULATORY_CARE_PROVIDER_SITE_OTHER): Payer: Self-pay

## 2022-09-18 ENCOUNTER — Ambulatory Visit (INDEPENDENT_AMBULATORY_CARE_PROVIDER_SITE_OTHER): Payer: Medicare Other | Admitting: Vascular Surgery

## 2022-09-18 VITALS — BP 123/78 | HR 82 | Resp 16 | Wt 294.0 lb

## 2022-09-18 DIAGNOSIS — M7989 Other specified soft tissue disorders: Secondary | ICD-10-CM

## 2022-09-18 DIAGNOSIS — L97212 Non-pressure chronic ulcer of right calf with fat layer exposed: Secondary | ICD-10-CM | POA: Diagnosis not present

## 2022-09-18 NOTE — Progress Notes (Signed)
History of Present Illness  There is no documented history at this time  Assessments & Plan   There are no diagnoses linked to this encounter.    Additional instructions  Subjective:  Patient presents with venous ulcer of the Bilateral lower extremity.    Procedure:  3 layer unna wrap was placed Bilateral lower extremity.   Plan:   Follow up in one week.  

## 2022-09-25 ENCOUNTER — Encounter (INDEPENDENT_AMBULATORY_CARE_PROVIDER_SITE_OTHER): Payer: Medicare Other

## 2022-09-26 ENCOUNTER — Ambulatory Visit (INDEPENDENT_AMBULATORY_CARE_PROVIDER_SITE_OTHER): Payer: Medicare Other | Admitting: Nurse Practitioner

## 2022-09-26 ENCOUNTER — Encounter (INDEPENDENT_AMBULATORY_CARE_PROVIDER_SITE_OTHER): Payer: Self-pay

## 2022-09-26 VITALS — BP 131/86 | HR 62 | Resp 18 | Ht 72.0 in | Wt 294.0 lb

## 2022-09-26 DIAGNOSIS — L97212 Non-pressure chronic ulcer of right calf with fat layer exposed: Secondary | ICD-10-CM | POA: Diagnosis not present

## 2022-09-26 NOTE — Progress Notes (Signed)
History of Present Illness  There is no documented history at this time  Assessments & Plan   There are no diagnoses linked to this encounter.    Additional instructions  Subjective:  Patient presents with venous ulcer of the Bilateral lower extremity.    Procedure:  3 layer unna wrap was placed Bilateral lower extremity.   Plan:   Follow up in one week.  Aquacel on anterior right lower leg

## 2022-10-02 ENCOUNTER — Encounter (INDEPENDENT_AMBULATORY_CARE_PROVIDER_SITE_OTHER): Payer: Self-pay

## 2022-10-02 ENCOUNTER — Ambulatory Visit (INDEPENDENT_AMBULATORY_CARE_PROVIDER_SITE_OTHER): Payer: Medicare Other | Admitting: Nurse Practitioner

## 2022-10-02 VITALS — BP 120/78 | HR 76 | Resp 16 | Wt 292.0 lb

## 2022-10-02 DIAGNOSIS — L97212 Non-pressure chronic ulcer of right calf with fat layer exposed: Secondary | ICD-10-CM

## 2022-10-02 NOTE — Progress Notes (Signed)
History of Present Illness  There is no documented history at this time  Assessments & Plan   There are no diagnoses linked to this encounter.    Additional instructions  Subjective:  Patient presents with venous ulcer of the Bilateral lower extremity.    Procedure:  3 layer unna wrap was placed Bilateral lower extremity.   Plan:   Follow up in one week.  

## 2022-10-09 ENCOUNTER — Ambulatory Visit (INDEPENDENT_AMBULATORY_CARE_PROVIDER_SITE_OTHER): Payer: Medicare Other | Admitting: Nurse Practitioner

## 2022-10-09 ENCOUNTER — Encounter (INDEPENDENT_AMBULATORY_CARE_PROVIDER_SITE_OTHER): Payer: Self-pay | Admitting: Nurse Practitioner

## 2022-10-09 VITALS — BP 114/78 | HR 71 | Resp 18 | Wt 293.0 lb

## 2022-10-09 DIAGNOSIS — L97212 Non-pressure chronic ulcer of right calf with fat layer exposed: Secondary | ICD-10-CM | POA: Diagnosis not present

## 2022-10-09 NOTE — Progress Notes (Signed)
History of Present Illness  There is no documented history at this time  Assessments & Plan   There are no diagnoses linked to this encounter.    Additional instructions  Subjective:  Patient presents with venous ulcer of the Bilateral lower extremity.    Procedure:  3 layer unna wrap was placed Bilateral lower extremity.   Plan:   Follow up in one week.  Aquacel lower left anterior leg

## 2022-10-14 ENCOUNTER — Encounter (INDEPENDENT_AMBULATORY_CARE_PROVIDER_SITE_OTHER): Payer: Self-pay | Admitting: Nurse Practitioner

## 2022-10-16 ENCOUNTER — Ambulatory Visit (INDEPENDENT_AMBULATORY_CARE_PROVIDER_SITE_OTHER): Payer: Medicare Other | Admitting: Vascular Surgery

## 2022-10-16 VITALS — BP 138/83 | HR 65 | Resp 18 | Ht 72.0 in | Wt 295.6 lb

## 2022-10-16 DIAGNOSIS — I10 Essential (primary) hypertension: Secondary | ICD-10-CM | POA: Diagnosis not present

## 2022-10-16 DIAGNOSIS — I890 Lymphedema, not elsewhere classified: Secondary | ICD-10-CM | POA: Diagnosis not present

## 2022-10-16 DIAGNOSIS — M7989 Other specified soft tissue disorders: Secondary | ICD-10-CM

## 2022-10-16 NOTE — Progress Notes (Signed)
[name] : [id number]  Warren Leblanc is a 77 y.o. (1946-01-15) male who presents with chief complaint of  Chief Complaint  Patient presents with   Venous Insufficiency  .  History of Present Illness: Patient returns today in follow up of longstanding lymphedema and leg swelling.  The new Unna boot wraps that we have available in our office seem to be rolling down more on his legs than the old ones.  This has resulted in swelling between his knee and upper calf.  He has several superficial ulcerations and scaling skin, but all in all the swelling is about the same.  No fevers or chills.  Current Outpatient Medications  Medication Sig Dispense Refill   Ascorbic Acid (VITAMIN C) 1000 MG tablet Take by mouth.     Biotin 2.5 MG TABS Take by mouth.     Cyanocobalamin (VITAMIN B 12 PO) Take by mouth.     folic acid (FOLVITE) 1 MG tablet Take by mouth.     lisinopril-hydrochlorothiazide (ZESTORETIC) 20-12.5 MG tablet Take 2 tablets by mouth daily.      MAGNESIUM OXIDE PO Take by mouth.     Niacinamide-Zn-Cu-Methfo-Se-Cr (NICOTINAMIDE PO) Take 500 mg by mouth in the morning and at bedtime.     Potassium 99 MG TABS Take by mouth.     TURMERIC PO Take by mouth.     UNABLE TO FIND [employer]     No current facility-administered medications for this visit.    Past Medical History:  Diagnosis Date   Hypertension     No past surgical history on file.   Social History   Tobacco Use   Smoking status: Some Days   Smokeless tobacco: Never  Substance Use Topics   Alcohol use: No   Drug use: No      No family history on file.   Allergies  Allergen Reactions   Amoxicillin    Amoxicillin-Pot Clavulanate    Antihistamines, Chlorpheniramine-Type Nausea Only   Diphenhydramine Hcl     Other reaction(s): Other (See Comments) drowsy     REVIEW OF SYSTEMS (Negative unless checked)   Constitutional: [] Weight loss  [] Fever  [] Chills Cardiac: [] Chest pain   [] Chest pressure    [] Palpitations   [] Shortness of breath when laying flat   [] Shortness of breath at rest   [] Shortness of breath with exertion. Vascular:  [x] Pain in legs with walking   [x] Pain in legs at rest   [] Pain in legs when laying flat   [] Claudication   [] Pain in feet when walking  [] Pain in feet at rest  [] Pain in feet when laying flat   [] History of DVT   [] Phlebitis   [x] Swelling in legs   [] Varicose veins   [] Non-healing ulcers Pulmonary:   [] Uses home oxygen   [] Productive cough   [] Hemoptysis   [] Wheeze  [] COPD   [] Asthma Neurologic:  [] Dizziness  [] Blackouts   [] Seizures   [] History of stroke   [] History of TIA  [] Aphasia   [] Temporary blindness   [] Dysphagia   [] Weakness or numbness in arms   [] Weakness or numbness in legs Musculoskeletal:  [x] Arthritis   [] Joint swelling   [x] Joint pain   [] Low back pain Hematologic:  [] Easy bruising  [] Easy bleeding   [] Hypercoagulable state   [] Anemic   Gastrointestinal:  [] Blood in stool   [] Vomiting blood  [] Gastroesophageal reflux/heartburn   [] Abdominal pain Genitourinary:  [] Chronic kidney disease   [] Difficult urination  [] Frequent urination  [] Burning with urination   [] Hematuria  Skin:  [x] Rashes   [x] Ulcers   [x] Wounds Psychological:  [] History of anxiety   []  History of major depression  Physical Examination  BP 138/83 (BP Location: Right Arm)   Pulse 65   Resp 18   Ht 6' (1.829 m)   Wt 295 lb 9.6 oz (134.1 kg)   BMI 40.09 kg/m  Gen:  WD/WN, NAD Head: [place]/AT, No temporalis wasting. Ear/Nose/Throat: Hearing grossly intact, nares w/o erythema or drainage Eyes: Conjunctiva clear. Sclera non-icteric Neck: Supple.  Trachea midline Pulmonary:  Good air movement, no use of accessory muscles.  Cardiac: RRR, no JVD Vascular:  Vessel Right Left  Radial Palpable Palpable           Musculoskeletal: M/S 5/5 throughout.  No deformity or atrophy. 2+ BLE edema. Neurologic: Sensation grossly intact in extremities.  Symmetrical.  Speech is fluent.   Psychiatric: Judgment intact, Mood & affect appropriate for pt's clinical situation. Dermatologic: Hyperpigmentation with dry scaling skin throughout both lower extremities and several small superficial ulcerations.      Labs No results found for this or any previous visit (from the past 2160 hour(s)).  Radiology No results found.  Assessment/Plan Lymphedema Swelling is similar today.  Continue weekly Unna boots.  Elevate and activity as tolerated.  Getting his electrolytes under control seems to have been helpful.   Swelling of limb Swelling is similar today.  Continue weekly Unna boots.  Elevate and activity as tolerated.  Getting his electrolytes under control seems to have been helpful.   Essential hypertension, benign blood pressure control important in reducing the progression of atherosclerotic disease. On appropriate oral medications.    Festus Barren, MD  10/16/2022 11:55 AM    This note was created with Dragon medical transcription system.  Any errors from dictation are purely unintentional

## 2022-10-23 ENCOUNTER — Ambulatory Visit (INDEPENDENT_AMBULATORY_CARE_PROVIDER_SITE_OTHER): Payer: Medicare Other | Admitting: Nurse Practitioner

## 2022-10-23 ENCOUNTER — Encounter (INDEPENDENT_AMBULATORY_CARE_PROVIDER_SITE_OTHER): Payer: Self-pay

## 2022-10-23 VITALS — BP 118/79 | HR 62 | Resp 16 | Wt 300.0 lb

## 2022-10-23 DIAGNOSIS — I890 Lymphedema, not elsewhere classified: Secondary | ICD-10-CM

## 2022-10-23 NOTE — Progress Notes (Signed)
History of Present Illness  There is no documented history at this time  Assessments & Plan   There are no diagnoses linked to this encounter.    Additional instructions  Subjective:  Patient presents with venous ulcer of the Bilateral lower extremity.    Procedure:  3 layer unna wrap was placed Bilateral lower extremity.   Plan:   Follow up in one week.  

## 2022-10-30 ENCOUNTER — Encounter (INDEPENDENT_AMBULATORY_CARE_PROVIDER_SITE_OTHER): Payer: Medicare Other

## 2022-10-31 ENCOUNTER — Ambulatory Visit (INDEPENDENT_AMBULATORY_CARE_PROVIDER_SITE_OTHER): Payer: Medicare Other | Admitting: Nurse Practitioner

## 2022-10-31 ENCOUNTER — Encounter (INDEPENDENT_AMBULATORY_CARE_PROVIDER_SITE_OTHER): Payer: Self-pay

## 2022-10-31 VITALS — BP 121/81 | HR 65 | Resp 16 | Wt 295.3 lb

## 2022-10-31 DIAGNOSIS — I890 Lymphedema, not elsewhere classified: Secondary | ICD-10-CM

## 2022-10-31 NOTE — Progress Notes (Signed)
History of Present Illness  There is no documented history at this time  Assessments & Plan   There are no diagnoses linked to this encounter.    Additional instructions  Subjective:  Patient presents with venous ulcer of the Bilateral lower extremity.    Procedure:  3 layer unna wrap was placed Bilateral lower extremity.   Plan:   Follow up in one week.

## 2022-11-06 ENCOUNTER — Encounter (INDEPENDENT_AMBULATORY_CARE_PROVIDER_SITE_OTHER): Payer: Self-pay | Admitting: Nurse Practitioner

## 2022-11-06 ENCOUNTER — Ambulatory Visit (INDEPENDENT_AMBULATORY_CARE_PROVIDER_SITE_OTHER): Payer: Medicare Other | Admitting: Nurse Practitioner

## 2022-11-06 VITALS — BP 121/83 | HR 76 | Resp 18 | Ht 72.0 in | Wt 294.4 lb

## 2022-11-06 DIAGNOSIS — I890 Lymphedema, not elsewhere classified: Secondary | ICD-10-CM | POA: Diagnosis not present

## 2022-11-06 NOTE — Progress Notes (Signed)
History of Present Illness  There is no documented history at this time  Assessments & Plan   There are no diagnoses linked to this encounter.    Additional instructions  Subjective:  Patient presents with venous ulcer of the Bilateral lower extremity.    Procedure:  3 layer unna wrap was placed Bilateral lower extremity.   Plan:   Follow up in one week.  

## 2022-11-13 ENCOUNTER — Encounter (INDEPENDENT_AMBULATORY_CARE_PROVIDER_SITE_OTHER): Payer: Self-pay

## 2022-11-13 ENCOUNTER — Ambulatory Visit (INDEPENDENT_AMBULATORY_CARE_PROVIDER_SITE_OTHER): Payer: Medicare Other | Admitting: Nurse Practitioner

## 2022-11-13 VITALS — BP 125/85 | HR 91 | Resp 18 | Ht 72.0 in | Wt 294.6 lb

## 2022-11-13 DIAGNOSIS — I890 Lymphedema, not elsewhere classified: Secondary | ICD-10-CM | POA: Diagnosis not present

## 2022-11-13 NOTE — Progress Notes (Signed)
History of Present Illness  There is no documented history at this time  Assessments & Plan   There are no diagnoses linked to this encounter.    Additional instructions  Subjective:  Patient presents with venous ulcer of the Bilateral lower extremity.    Procedure:  3 layer unna wrap was placed Bilateral lower extremity.   Plan:   Follow up in one week.  

## 2022-11-20 ENCOUNTER — Ambulatory Visit (INDEPENDENT_AMBULATORY_CARE_PROVIDER_SITE_OTHER): Payer: Medicare Other | Admitting: Nurse Practitioner

## 2022-11-20 ENCOUNTER — Encounter (INDEPENDENT_AMBULATORY_CARE_PROVIDER_SITE_OTHER): Payer: Self-pay

## 2022-11-20 VITALS — BP 132/82 | HR 65 | Resp 16 | Wt 299.0 lb

## 2022-11-20 DIAGNOSIS — I890 Lymphedema, not elsewhere classified: Secondary | ICD-10-CM | POA: Diagnosis not present

## 2022-11-20 NOTE — Progress Notes (Signed)
History of Present Illness  There is no documented history at this time  Assessments & Plan   There are no diagnoses linked to this encounter.    Additional instructions  Subjective:  Patient presents with venous ulcer of the Bilateral lower extremity.    Procedure:  3 layer unna wrap was placed Bilateral lower extremity.   Plan:   Follow up in one week.  

## 2022-11-25 ENCOUNTER — Encounter (INDEPENDENT_AMBULATORY_CARE_PROVIDER_SITE_OTHER): Payer: Self-pay | Admitting: Nurse Practitioner

## 2022-11-27 ENCOUNTER — Ambulatory Visit (INDEPENDENT_AMBULATORY_CARE_PROVIDER_SITE_OTHER): Payer: Medicare Other | Admitting: Nurse Practitioner

## 2022-11-27 ENCOUNTER — Encounter (INDEPENDENT_AMBULATORY_CARE_PROVIDER_SITE_OTHER): Payer: Self-pay

## 2022-11-27 VITALS — BP 129/80 | HR 64 | Resp 16 | Wt 298.0 lb

## 2022-11-27 DIAGNOSIS — I890 Lymphedema, not elsewhere classified: Secondary | ICD-10-CM | POA: Diagnosis not present

## 2022-11-27 NOTE — Progress Notes (Signed)
History of Present Illness  There is no documented history at this time  Assessments & Plan   There are no diagnoses linked to this encounter.    Additional instructions  Subjective:  Patient presents with venous ulcer of the Bilateral lower extremity.    Procedure:  3 layer unna wrap was placed Bilateral lower extremity.   Plan:   Follow up in one week.  

## 2022-12-03 ENCOUNTER — Encounter (INDEPENDENT_AMBULATORY_CARE_PROVIDER_SITE_OTHER): Payer: Self-pay | Admitting: Nurse Practitioner

## 2022-12-04 ENCOUNTER — Encounter (INDEPENDENT_AMBULATORY_CARE_PROVIDER_SITE_OTHER): Payer: Self-pay | Admitting: Vascular Surgery

## 2022-12-04 ENCOUNTER — Ambulatory Visit (INDEPENDENT_AMBULATORY_CARE_PROVIDER_SITE_OTHER): Payer: Medicare Other | Admitting: Vascular Surgery

## 2022-12-04 VITALS — BP 126/84 | HR 75 | Resp 16 | Wt 296.0 lb

## 2022-12-04 DIAGNOSIS — M7989 Other specified soft tissue disorders: Secondary | ICD-10-CM | POA: Diagnosis not present

## 2022-12-04 DIAGNOSIS — I10 Essential (primary) hypertension: Secondary | ICD-10-CM | POA: Diagnosis not present

## 2022-12-04 DIAGNOSIS — I890 Lymphedema, not elsewhere classified: Secondary | ICD-10-CM | POA: Diagnosis not present

## 2022-12-04 NOTE — Progress Notes (Signed)
[name] : [id number]  Warren Leblanc is a 77 y.o. (1945/08/20) male who presents with chief complaint of  Chief Complaint  Patient presents with   Follow-up    6 week unna follow up  .  History of Present Illness: Patient returns today in follow up of his leg swelling and lymphedema.  It actually looks some better today.  His swelling is still pronounced but the skin certainly looks a lot lighter and healthier than it has for most of the summer.  This has been an ongoing problem for him for many years.  He has longstanding severe lymphedema.  Current Outpatient Medications  Medication Sig Dispense Refill   Ascorbic Acid (VITAMIN C) 1000 MG tablet Take by mouth.     Biotin 2.5 MG TABS Take by mouth.     Cyanocobalamin (VITAMIN B 12 PO) Take by mouth.     folic acid (FOLVITE) 1 MG tablet Take by mouth.     lisinopril-hydrochlorothiazide (ZESTORETIC) 20-12.5 MG tablet Take 2 tablets by mouth daily.      MAGNESIUM OXIDE PO Take by mouth.     Niacinamide-Zn-Cu-Methfo-Se-Cr (NICOTINAMIDE PO) Take 500 mg by mouth in the morning and at bedtime.     Potassium 99 MG TABS Take by mouth.     TURMERIC PO Take by mouth.     UNABLE TO FIND [employer]     No current facility-administered medications for this visit.    Past Medical History:  Diagnosis Date   Hypertension     No past surgical history on file.   Social History   Tobacco Use   Smoking status: Some Days   Smokeless tobacco: Never  Substance Use Topics   Alcohol use: No   Drug use: No      No family history on file.   Allergies  Allergen Reactions   Amoxicillin    Amoxicillin-Pot Clavulanate    Antihistamines, Chlorpheniramine-Type Nausea Only   Diphenhydramine Hcl     Other reaction(s): Other (See Comments) drowsy     REVIEW OF SYSTEMS (Negative unless checked)   Constitutional: [] Weight loss  [] Fever  [] Chills Cardiac: [] Chest pain   [] Chest pressure   [] Palpitations   [] Shortness of breath when laying  flat   [] Shortness of breath at rest   [] Shortness of breath with exertion. Vascular:  [x] Pain in legs with walking   [x] Pain in legs at rest   [] Pain in legs when laying flat   [] Claudication   [] Pain in feet when walking  [] Pain in feet at rest  [] Pain in feet when laying flat   [] History of DVT   [] Phlebitis   [x] Swelling in legs   [] Varicose veins   [] Non-healing ulcers Pulmonary:   [] Uses home oxygen   [] Productive cough   [] Hemoptysis   [] Wheeze  [] COPD   [] Asthma Neurologic:  [] Dizziness  [] Blackouts   [] Seizures   [] History of stroke   [] History of TIA  [] Aphasia   [] Temporary blindness   [] Dysphagia   [] Weakness or numbness in arms   [] Weakness or numbness in legs Musculoskeletal:  [x] Arthritis   [] Joint swelling   [x] Joint pain   [] Low back pain Hematologic:  [] Easy bruising  [] Easy bleeding   [] Hypercoagulable state   [] Anemic   Gastrointestinal:  [] Blood in stool   [] Vomiting blood  [] Gastroesophageal reflux/heartburn   [] Abdominal pain Genitourinary:  [] Chronic kidney disease   [] Difficult urination  [] Frequent urination  [] Burning with urination   [] Hematuria Skin:  [x] Rashes   [x] Ulcers   [  x]Wounds Psychological:  [] History of anxiety   []  History of major depression  Physical Examination  BP 126/84 (BP Location: Right Arm)   Pulse 75   Resp 16   Wt 296 lb (134.3 kg)   BMI 40.14 kg/m  Gen:  WD/WN, NAD Head: [place]/AT, No temporalis wasting. Ear/Nose/Throat: Hearing grossly intact, nares w/o erythema or drainage Eyes: Conjunctiva clear. Sclera non-icteric Neck: Supple.  Trachea midline Pulmonary:  Good air movement, no use of accessory muscles.  Cardiac: RRR, no JVD Vascular:  Vessel Right Left  Radial Palpable Palpable           Musculoskeletal: M/S 5/5 throughout.  No deformity or atrophy. 1-2+ RLE edema, 2+ LLE edema. Neurologic: Sensation grossly intact in extremities.  Symmetrical.  Speech is fluent.  Psychiatric: Judgment intact, Mood & affect appropriate for pt's  clinical situation. Dermatologic: No rashes or ulcers noted.  No cellulitis or open wounds.      Labs No results found for this or any previous visit (from the past 2160 hour(s)).  Radiology No results found.  Assessment/Plan   Lymphedema Swelling is a little better today.  Continue weekly Unna boots.  Elevate and activity as tolerated.  Getting his electrolytes under control seems to have been helpful.   Swelling of limb Swelling is a little better today.  Continue weekly Unna boots.  Elevate and activity as tolerated.  Getting his electrolytes under control seems to have been helpful.   Essential hypertension, benign blood pressure control important in reducing the progression of atherosclerotic disease. On appropriate oral medications.  Festus Barren, MD  12/04/2022 11:25 AM    This note was created with Dragon medical transcription system.  Any errors from dictation are purely unintentional

## 2022-12-11 ENCOUNTER — Encounter (INDEPENDENT_AMBULATORY_CARE_PROVIDER_SITE_OTHER): Payer: Medicare Other

## 2022-12-12 ENCOUNTER — Ambulatory Visit (INDEPENDENT_AMBULATORY_CARE_PROVIDER_SITE_OTHER): Payer: Medicare Other | Admitting: Vascular Surgery

## 2022-12-12 VITALS — BP 107/70 | HR 69 | Resp 16 | Wt 295.1 lb

## 2022-12-12 DIAGNOSIS — I890 Lymphedema, not elsewhere classified: Secondary | ICD-10-CM

## 2022-12-12 NOTE — Progress Notes (Signed)
History of Present Illness  There is no documented history at this time  Assessments & Plan   There are no diagnoses linked to this encounter.    Additional instructions  Subjective:  Patient presents with venous ulcer of the Bilateral lower extremity.    Procedure:  3 layer unna wrap was placed Bilateral lower extremity.   Plan:   Follow up in one week.  

## 2022-12-18 ENCOUNTER — Encounter (INDEPENDENT_AMBULATORY_CARE_PROVIDER_SITE_OTHER): Payer: Medicare Other

## 2022-12-19 ENCOUNTER — Encounter (INDEPENDENT_AMBULATORY_CARE_PROVIDER_SITE_OTHER): Payer: Self-pay

## 2022-12-19 ENCOUNTER — Ambulatory Visit (INDEPENDENT_AMBULATORY_CARE_PROVIDER_SITE_OTHER): Payer: Medicare Other | Admitting: Nurse Practitioner

## 2022-12-19 VITALS — BP 129/81 | HR 69 | Resp 16 | Wt 298.0 lb

## 2022-12-19 DIAGNOSIS — I890 Lymphedema, not elsewhere classified: Secondary | ICD-10-CM | POA: Diagnosis not present

## 2022-12-19 NOTE — Progress Notes (Signed)
History of Present Illness  There is no documented history at this time  Assessments & Plan   There are no diagnoses linked to this encounter.    Additional instructions  Subjective:  Patient presents with venous ulcer of the Bilateral lower extremity.    Procedure:  3 layer unna wrap was placed Bilateral lower extremity.   Plan:   Follow up in one week.  

## 2022-12-23 ENCOUNTER — Encounter (INDEPENDENT_AMBULATORY_CARE_PROVIDER_SITE_OTHER): Payer: Self-pay | Admitting: Nurse Practitioner

## 2022-12-25 ENCOUNTER — Ambulatory Visit (INDEPENDENT_AMBULATORY_CARE_PROVIDER_SITE_OTHER): Payer: Medicare Other | Admitting: Nurse Practitioner

## 2022-12-25 ENCOUNTER — Encounter (INDEPENDENT_AMBULATORY_CARE_PROVIDER_SITE_OTHER): Payer: Self-pay

## 2022-12-25 VITALS — BP 126/82 | HR 66 | Resp 18 | Ht 72.0 in | Wt 298.0 lb

## 2022-12-25 DIAGNOSIS — I890 Lymphedema, not elsewhere classified: Secondary | ICD-10-CM | POA: Diagnosis not present

## 2022-12-25 NOTE — Progress Notes (Signed)
History of Present Illness  There is no documented history at this time  Assessments & Plan   There are no diagnoses linked to this encounter.    Additional instructions  Subjective:  Patient presents with venous ulcer of the Bilateral lower extremity.    Procedure:  3 layer unna wrap was placed Bilateral lower extremity.   Plan:   Follow up in one week.  

## 2023-01-01 ENCOUNTER — Encounter (INDEPENDENT_AMBULATORY_CARE_PROVIDER_SITE_OTHER): Payer: Self-pay

## 2023-01-01 ENCOUNTER — Ambulatory Visit (INDEPENDENT_AMBULATORY_CARE_PROVIDER_SITE_OTHER): Payer: Medicare Other | Admitting: Nurse Practitioner

## 2023-01-01 VITALS — BP 121/82 | HR 70 | Resp 16 | Wt 294.0 lb

## 2023-01-01 DIAGNOSIS — I890 Lymphedema, not elsewhere classified: Secondary | ICD-10-CM | POA: Diagnosis not present

## 2023-01-01 NOTE — Progress Notes (Signed)
History of Present Illness  There is no documented history at this time  Assessments & Plan   There are no diagnoses linked to this encounter.    Additional instructions  Subjective:  Patient presents with venous ulcer of the Bilateral lower extremity.    Procedure:  3 layer unna wrap was placed Bilateral lower extremity.   Plan:   Follow up in one week.  

## 2023-01-08 ENCOUNTER — Encounter (INDEPENDENT_AMBULATORY_CARE_PROVIDER_SITE_OTHER): Payer: Self-pay

## 2023-01-08 ENCOUNTER — Ambulatory Visit (INDEPENDENT_AMBULATORY_CARE_PROVIDER_SITE_OTHER): Payer: Medicare Other | Admitting: Nurse Practitioner

## 2023-01-08 VITALS — BP 126/79 | HR 74 | Resp 16 | Wt 292.0 lb

## 2023-01-08 DIAGNOSIS — I890 Lymphedema, not elsewhere classified: Secondary | ICD-10-CM

## 2023-01-08 NOTE — Progress Notes (Signed)
History of Present Illness  There is no documented history at this time  Assessments & Plan   There are no diagnoses linked to this encounter.    Additional instructions  Subjective:  Patient presents with venous ulcer of the Bilateral lower extremity.    Procedure:  3 layer unna wrap was placed Bilateral lower extremity.   Plan:   Follow up in one week.  

## 2023-01-15 ENCOUNTER — Encounter (INDEPENDENT_AMBULATORY_CARE_PROVIDER_SITE_OTHER): Payer: Self-pay | Admitting: Vascular Surgery

## 2023-01-15 ENCOUNTER — Ambulatory Visit (INDEPENDENT_AMBULATORY_CARE_PROVIDER_SITE_OTHER): Payer: Medicare Other | Admitting: Vascular Surgery

## 2023-01-15 VITALS — BP 125/78 | HR 70 | Resp 16 | Wt 293.4 lb

## 2023-01-15 DIAGNOSIS — I890 Lymphedema, not elsewhere classified: Secondary | ICD-10-CM

## 2023-01-15 DIAGNOSIS — M7989 Other specified soft tissue disorders: Secondary | ICD-10-CM | POA: Diagnosis not present

## 2023-01-15 DIAGNOSIS — I10 Essential (primary) hypertension: Secondary | ICD-10-CM | POA: Diagnosis not present

## 2023-01-15 NOTE — Assessment & Plan Note (Signed)
A 3 layer Unna boot was placed on both lower extremities today and will be changed weekly.  We are seeing some slow improvement.  Would recommend he use his lymphedema pump every day and continue the Unna boots.  Follow-up in 6 weeks.

## 2023-01-15 NOTE — Progress Notes (Signed)
[name] : [id number]  Warren Leblanc is a 77 y.o. (11-14-1945) male who presents with chief complaint of  Chief Complaint  Patient presents with   Follow-up    6 week unna follow up  .  History of Present Illness: Patient returns today in follow up of lymphedema and leg swelling.  He has longstanding lymphedema and has been in [employer] now for several years.  These are continuing.  He has had some steady improvement over the past 2 to 3 months with weekly wrappings of his Unna boots.  He still has significant leg swelling but his skin is healing and looking better with less dry skin and the leg swelling is certainly improved.  Current Outpatient Medications  Medication Sig Dispense Refill   Ascorbic Acid (VITAMIN C) 1000 MG tablet Take by mouth.     Biotin 2.5 MG TABS Take by mouth.     Cyanocobalamin (VITAMIN B 12 PO) Take by mouth.     folic acid (FOLVITE) 1 MG tablet Take by mouth.     lisinopril-hydrochlorothiazide (ZESTORETIC) 20-12.5 MG tablet Take 2 tablets by mouth daily.      MAGNESIUM OXIDE PO Take by mouth.     Niacinamide-Zn-Cu-Methfo-Se-Cr (NICOTINAMIDE PO) Take 500 mg by mouth in the morning and at bedtime.     Potassium 99 MG TABS Take by mouth.     TURMERIC PO Take by mouth.     UNABLE TO FIND [facility]     No current facility-administered medications for this visit.    Past Medical History:  Diagnosis Date   Hypertension     No past surgical history on file.   Social History   Tobacco Use   Smoking status: Some Days   Smokeless tobacco: Never  Substance Use Topics   Alcohol use: No   Drug use: No       No family history on file.   Allergies  Allergen Reactions   Amoxicillin    Amoxicillin-Pot Clavulanate    Antihistamines, Chlorpheniramine-Type Nausea Only   Diphenhydramine Hcl     Other reaction(s): Other (See Comments) drowsy     REVIEW OF SYSTEMS (Negative unless checked)   Constitutional: [] Weight loss  [] Fever  [] Chills Cardiac:  [] Chest pain   [] Chest pressure   [] Palpitations   [] Shortness of breath when laying flat   [] Shortness of breath at rest   [] Shortness of breath with exertion. Vascular:  [x] Pain in legs with walking   [x] Pain in legs at rest   [] Pain in legs when laying flat   [] Claudication   [] Pain in feet when walking  [] Pain in feet at rest  [] Pain in feet when laying flat   [] History of DVT   [] Phlebitis   [x] Swelling in legs   [] Varicose veins   [] Non-healing ulcers Pulmonary:   [] Uses home oxygen   [] Productive cough   [] Hemoptysis   [] Wheeze  [] COPD   [] Asthma Neurologic:  [] Dizziness  [] Blackouts   [] Seizures   [] History of stroke   [] History of TIA  [] Aphasia   [] Temporary blindness   [] Dysphagia   [] Weakness or numbness in arms   [] Weakness or numbness in legs Musculoskeletal:  [x] Arthritis   [] Joint swelling   [x] Joint pain   [] Low back pain Hematologic:  [] Easy bruising  [] Easy bleeding   [] Hypercoagulable state   [] Anemic   Gastrointestinal:  [] Blood in stool   [] Vomiting blood  [] Gastroesophageal reflux/heartburn   [] Abdominal pain Genitourinary:  [] Chronic kidney disease   [] Difficult  urination  [] Frequent urination  [] Burning with urination   [] Hematuria Skin:  [x] Rashes   [x] Ulcers   [x] Wounds Psychological:  [] History of anxiety   []  History of major depression  Physical Examination  BP 125/78 (BP Location: Right Arm)   Pulse 70   Resp 16   Wt 293 lb 6.4 oz (133.1 kg)   BMI 39.79 kg/m  Gen:  WD/WN, NAD Head: [place]/AT, No temporalis wasting. Ear/Nose/Throat: Hearing grossly intact, nares w/o erythema or drainage Eyes: Conjunctiva clear. Sclera non-icteric Neck: Supple.  Trachea midline Pulmonary:  Good air movement, no use of accessory muscles.  Cardiac: RRR, no JVD Vascular:  Vessel Right Left  Radial Palpable Palpable           Musculoskeletal: M/S 5/5 throughout.  No deformity or atrophy.  1-2+ bilateral lower extremity edema.  Significant hyperpigmentation and moderate stasis  dermatitis changes. Neurologic: Sensation grossly intact in extremities.  Symmetrical.  Speech is fluent.  Psychiatric: Judgment intact, Mood & affect appropriate for pt's clinical situation. Dermatologic: No rashes or ulcers noted.  No cellulitis or open wounds.      Labs No results found for this or any previous visit (from the past 2160 hour(s)).  Radiology No results found.  Assessment/Plan  Lymphedema A 3 layer Unna boot was placed on both lower extremities today and will be changed weekly.  We are seeing some slow improvement.  Would recommend he use his lymphedema pump every day and continue the Unna boots.  Follow-up in 6 weeks.  Swelling of limb Continue weekly Unna boots.  Swelling has been improved.  Elevate and activity as tolerated.  Getting his electrolytes under control seems to have been helpful.   Essential hypertension, benign blood pressure control important in reducing the progression of atherosclerotic disease. On appropriate oral medications.  Festus Barren, MD  01/15/2023 11:32 AM    This note was created with Dragon medical transcription system.  Any errors from dictation are purely unintentional

## 2023-01-22 ENCOUNTER — Ambulatory Visit (INDEPENDENT_AMBULATORY_CARE_PROVIDER_SITE_OTHER): Payer: Medicare Other | Admitting: Nurse Practitioner

## 2023-01-22 ENCOUNTER — Encounter (INDEPENDENT_AMBULATORY_CARE_PROVIDER_SITE_OTHER): Payer: Self-pay

## 2023-01-22 VITALS — BP 130/83 | HR 70 | Resp 16 | Wt 293.0 lb

## 2023-01-22 DIAGNOSIS — I890 Lymphedema, not elsewhere classified: Secondary | ICD-10-CM

## 2023-01-22 NOTE — Progress Notes (Signed)
History of Present Illness  There is no documented history at this time  Assessments & Plan   There are no diagnoses linked to this encounter.    Additional instructions  Subjective:  Patient presents with venous ulcer of the Bilateral lower extremity.    Procedure:  3 layer unna wrap was placed Bilateral lower extremity.   Plan:   Follow up in one week.  

## 2023-01-29 ENCOUNTER — Encounter (INDEPENDENT_AMBULATORY_CARE_PROVIDER_SITE_OTHER): Payer: Self-pay | Admitting: Nurse Practitioner

## 2023-01-29 ENCOUNTER — Ambulatory Visit (INDEPENDENT_AMBULATORY_CARE_PROVIDER_SITE_OTHER): Payer: Medicare Other | Admitting: Nurse Practitioner

## 2023-01-29 VITALS — BP 127/83 | HR 70 | Resp 16 | Wt 291.4 lb

## 2023-01-29 DIAGNOSIS — I890 Lymphedema, not elsewhere classified: Secondary | ICD-10-CM | POA: Diagnosis not present

## 2023-01-29 NOTE — Progress Notes (Signed)
History of Present Illness  There is no documented history at this time  Assessments & Plan   There are no diagnoses linked to this encounter.    Additional instructions  Subjective:  Patient presents with venous ulcer of the Bilateral lower extremity.    Procedure:  3 layer unna wrap was placed Bilateral lower extremity.   Plan:   Follow up in one week.  

## 2023-02-03 ENCOUNTER — Encounter (INDEPENDENT_AMBULATORY_CARE_PROVIDER_SITE_OTHER): Payer: Self-pay | Admitting: Nurse Practitioner

## 2023-02-05 ENCOUNTER — Ambulatory Visit (INDEPENDENT_AMBULATORY_CARE_PROVIDER_SITE_OTHER): Payer: Medicare Other | Admitting: Nurse Practitioner

## 2023-02-05 ENCOUNTER — Encounter (INDEPENDENT_AMBULATORY_CARE_PROVIDER_SITE_OTHER): Payer: Self-pay

## 2023-02-05 VITALS — BP 115/80 | HR 67 | Resp 16 | Wt 293.0 lb

## 2023-02-05 DIAGNOSIS — I890 Lymphedema, not elsewhere classified: Secondary | ICD-10-CM | POA: Diagnosis not present

## 2023-02-05 NOTE — Progress Notes (Signed)
 History of Present Illness  There is no documented history at this time  Assessments & Plan   There are no diagnoses linked to this encounter.    Additional instructions  Subjective:  Patient presents with venous ulcer of the Bilateral lower extremity.    Procedure:  3 layer unna wrap was placed Bilateral lower extremity.   Plan:   Follow up in one week.

## 2023-02-06 ENCOUNTER — Encounter (INDEPENDENT_AMBULATORY_CARE_PROVIDER_SITE_OTHER): Payer: Self-pay | Admitting: Nurse Practitioner

## 2023-02-12 ENCOUNTER — Ambulatory Visit (INDEPENDENT_AMBULATORY_CARE_PROVIDER_SITE_OTHER): Payer: Medicare Other | Admitting: Nurse Practitioner

## 2023-02-12 ENCOUNTER — Encounter (INDEPENDENT_AMBULATORY_CARE_PROVIDER_SITE_OTHER): Payer: Self-pay | Admitting: Nurse Practitioner

## 2023-02-12 VITALS — BP 128/74 | HR 77 | Resp 16 | Wt 290.6 lb

## 2023-02-12 DIAGNOSIS — I890 Lymphedema, not elsewhere classified: Secondary | ICD-10-CM | POA: Diagnosis not present

## 2023-02-12 NOTE — Progress Notes (Signed)
 History of Present Illness  There is no documented history at this time  Assessments & Plan   There are no diagnoses linked to this encounter.    Additional instructions  Subjective:  Patient presents with venous ulcer of the Bilateral lower extremity.    Procedure:  3 layer unna wrap was placed Bilateral lower extremity.   Plan:   Follow up in one week.

## 2023-02-19 ENCOUNTER — Encounter (INDEPENDENT_AMBULATORY_CARE_PROVIDER_SITE_OTHER): Payer: Self-pay | Admitting: Nurse Practitioner

## 2023-02-19 ENCOUNTER — Encounter (INDEPENDENT_AMBULATORY_CARE_PROVIDER_SITE_OTHER): Payer: Medicare Other

## 2023-02-19 ENCOUNTER — Ambulatory Visit (INDEPENDENT_AMBULATORY_CARE_PROVIDER_SITE_OTHER): Payer: Medicare Other | Admitting: Nurse Practitioner

## 2023-02-19 VITALS — BP 134/78 | HR 69 | Resp 16 | Wt 292.4 lb

## 2023-02-19 DIAGNOSIS — I890 Lymphedema, not elsewhere classified: Secondary | ICD-10-CM

## 2023-02-19 NOTE — Progress Notes (Signed)
 History of Present Illness  There is no documented history at this time  Assessments & Plan   There are no diagnoses linked to this encounter.    Additional instructions  Subjective:  Patient presents with venous ulcer of the Bilateral lower extremity.    Procedure:  3 layer unna wrap was placed Bilateral lower extremity.   Plan:   Follow up in one week.

## 2023-02-26 ENCOUNTER — Ambulatory Visit (INDEPENDENT_AMBULATORY_CARE_PROVIDER_SITE_OTHER): Payer: Medicare Other | Admitting: Nurse Practitioner

## 2023-02-26 ENCOUNTER — Encounter (INDEPENDENT_AMBULATORY_CARE_PROVIDER_SITE_OTHER): Payer: Self-pay | Admitting: Nurse Practitioner

## 2023-02-26 VITALS — BP 122/86 | HR 65 | Resp 16 | Wt 291.4 lb

## 2023-02-26 DIAGNOSIS — I890 Lymphedema, not elsewhere classified: Secondary | ICD-10-CM | POA: Diagnosis not present

## 2023-02-26 NOTE — Progress Notes (Signed)
History of Present Illness  There is no documented history at this time  Assessments & Plan   There are no diagnoses linked to this encounter.    Additional instructions  Subjective:  Patient presents with venous ulcer of the Bilateral lower extremity.    Procedure:  3 layer unna wrap was placed Bilateral lower extremity.   Plan:   Follow up in one week.  

## 2023-03-05 ENCOUNTER — Ambulatory Visit (INDEPENDENT_AMBULATORY_CARE_PROVIDER_SITE_OTHER): Payer: Medicare Other | Admitting: Nurse Practitioner

## 2023-03-05 ENCOUNTER — Encounter (INDEPENDENT_AMBULATORY_CARE_PROVIDER_SITE_OTHER): Payer: Medicare Other

## 2023-03-05 ENCOUNTER — Encounter (INDEPENDENT_AMBULATORY_CARE_PROVIDER_SITE_OTHER): Payer: Self-pay

## 2023-03-05 VITALS — BP 130/89 | HR 69 | Resp 18 | Ht 72.0 in | Wt 289.6 lb

## 2023-03-05 DIAGNOSIS — I890 Lymphedema, not elsewhere classified: Secondary | ICD-10-CM

## 2023-03-05 NOTE — Progress Notes (Signed)
History of Present Illness  There is no documented history at this time  Assessments & Plan   There are no diagnoses linked to this encounter.    Additional instructions  Subjective:  Patient presents with venous ulcer of the Bilateral lower extremity.    Procedure:  3 layer unna wrap was placed Bilateral lower extremity.   Plan:   Follow up in one week.

## 2023-03-06 ENCOUNTER — Encounter (INDEPENDENT_AMBULATORY_CARE_PROVIDER_SITE_OTHER): Payer: Medicare Other

## 2023-03-06 ENCOUNTER — Telehealth (INDEPENDENT_AMBULATORY_CARE_PROVIDER_SITE_OTHER): Payer: Self-pay

## 2023-03-06 ENCOUNTER — Encounter (INDEPENDENT_AMBULATORY_CARE_PROVIDER_SITE_OTHER): Payer: Self-pay

## 2023-03-06 NOTE — Telephone Encounter (Signed)
Patient called needing an appointment to be rewrapped in unna boots after the initial wrapping was yesterday. Patient was able to be fit in today to be seen. Patient stated that his wraps were "busting open".   Upon arrival I went to room the patient and re wrap, but the patient refused me to wrap. Patient told me "no", and asked for April to wrap. April was assisting another patient. He stated he wanted to wait for her. Being that I wrapped him, I wanted to see what I may have done to make them bust.   After Vernona Rieger spoke to him, he wanted to come back when April was available.  Freida Busman showed Rebekah what was wrong but was hesitant to show me when I asked. Once he did show me and Vernona Rieger, you can see where the wraps were pushed down and cut on the laterally side of the leg. Alcario Drought again for me to wrap and him and he still refused Stating that he will come back Next Tuesday 03/15/23.

## 2023-03-06 NOTE — Telephone Encounter (Addendum)
Patient was advised that he could not just have April wrapping his leg and again was offered to have [place] rewrap his [employer]. Patient again declined. Patient wrap had been pushed down and rolled inside and cut. Patient was advised that April was helping another patient at that time as well.

## 2023-03-12 ENCOUNTER — Encounter (INDEPENDENT_AMBULATORY_CARE_PROVIDER_SITE_OTHER): Payer: Medicare Other

## 2023-03-12 ENCOUNTER — Encounter (INDEPENDENT_AMBULATORY_CARE_PROVIDER_SITE_OTHER): Payer: Self-pay | Admitting: Vascular Surgery

## 2023-03-12 ENCOUNTER — Ambulatory Visit (INDEPENDENT_AMBULATORY_CARE_PROVIDER_SITE_OTHER): Payer: Medicare Other | Admitting: Vascular Surgery

## 2023-03-12 VITALS — BP 147/92 | HR 96 | Resp 18 | Ht 72.0 in | Wt 289.6 lb

## 2023-03-12 DIAGNOSIS — I890 Lymphedema, not elsewhere classified: Secondary | ICD-10-CM

## 2023-03-12 DIAGNOSIS — I10 Essential (primary) hypertension: Secondary | ICD-10-CM | POA: Diagnosis not present

## 2023-03-12 DIAGNOSIS — M7989 Other specified soft tissue disorders: Secondary | ICD-10-CM

## 2023-03-12 NOTE — Assessment & Plan Note (Signed)
Worse.  See plan above

## 2023-03-12 NOTE — Assessment & Plan Note (Signed)
 blood pressure control important in reducing the progression of atherosclerotic disease. On appropriate oral medications.

## 2023-03-12 NOTE — Assessment & Plan Note (Signed)
 His symptoms are severe and significant and now he has worsening ulcerations on the right leg.  Were going to need to get his Unna boot wraps up higher on the right leg today.  He really needs to get back into his lymphedema pump but he says he cannot fit these anymore.  I think he may benefit from the Lymphapress pod or at least pants to get the compression up higher as he has swelling all the way up into his lower abdomen that is severe.  This has been a longstanding and severe problem for roughly 3 decades for Warren Leblanc.  Elevation and exercise are also very helpful.  Continue weekly Unna boots.

## 2023-03-12 NOTE — Progress Notes (Signed)
 [name] : [id number]  Warren Leblanc is a 78 y.o. (1945/02/22) male who presents with chief complaint of  Chief Complaint  Patient presents with   Follow-up    F/u unna boot   .  History of Present Illness: Patient returns today in follow up of his lymphedema, leg swelling, and ulceration.  His wraps rolled down particular on the right side and he developed some ulceration on the upper part of the calf with a significant amount of drainage.  No fever or chills.  No chest pain or shortness of breath.  Current Outpatient Medications  Medication Sig Dispense Refill   Ascorbic Acid (VITAMIN C) 1000 MG tablet Take by mouth.     Biotin 2.5 MG TABS Take by mouth.     Cyanocobalamin (VITAMIN B 12 PO) Take by mouth.     folic acid (FOLVITE) 1 MG tablet Take by mouth.     lisinopril-hydrochlorothiazide (ZESTORETIC) 20-12.5 MG tablet Take 2 tablets by mouth daily.      MAGNESIUM OXIDE PO Take by mouth.     Niacinamide-Zn-Cu-Methfo-Se-Cr (NICOTINAMIDE PO) Take 500 mg by mouth in the morning and at bedtime.     Potassium 99 MG TABS Take by mouth.     TURMERIC PO Take by mouth.     UNABLE TO FIND [employer]     No current facility-administered medications for this visit.    Past Medical History:  Diagnosis Date   Hypertension     History reviewed. No pertinent surgical history.   Social History   Tobacco Use   Smoking status: Some Days   Smokeless tobacco: Never  Substance Use Topics   Alcohol use: No   Drug use: No      History reviewed. No pertinent family history.   Allergies  Allergen Reactions   Amoxicillin    Amoxicillin-Pot Clavulanate    Antihistamines, Chlorpheniramine-Type Nausea Only   Diphenhydramine Hcl     Other reaction(s): Other (See Comments) drowsy      REVIEW OF SYSTEMS (Negative unless checked)   Constitutional: [] Weight loss  [] Fever  [] Chills Cardiac: [] Chest pain   [] Chest pressure   [] Palpitations   [] Shortness of breath when laying flat    [] Shortness of breath at rest   [] Shortness of breath with exertion. Vascular:  [x] Pain in legs with walking   [x] Pain in legs at rest   [] Pain in legs when laying flat   [] Claudication   [] Pain in feet when walking  [] Pain in feet at rest  [] Pain in feet when laying flat   [] History of DVT   [] Phlebitis   [x] Swelling in legs   [] Varicose veins   [] Non-healing ulcers Pulmonary:   [] Uses home oxygen   [] Productive cough   [] Hemoptysis   [] Wheeze  [] COPD   [] Asthma Neurologic:  [] Dizziness  [] Blackouts   [] Seizures   [] History of stroke   [] History of TIA  [] Aphasia   [] Temporary blindness   [] Dysphagia   [] Weakness or numbness in arms   [] Weakness or numbness in legs Musculoskeletal:  [x] Arthritis   [] Joint swelling   [x] Joint pain   [] Low back pain Hematologic:  [] Easy bruising  [] Easy bleeding   [] Hypercoagulable state   [] Anemic   Gastrointestinal:  [] Blood in stool   [] Vomiting blood  [] Gastroesophageal reflux/heartburn   [] Abdominal pain Genitourinary:  [] Chronic kidney disease   [] Difficult urination  [] Frequent urination  [] Burning with urination   [] Hematuria Skin:  [x] Rashes   [x] Ulcers   [x] Wounds Psychological:  [] History  of anxiety   []  History of major depression  Physical Examination  BP (!) 147/92   Pulse 96   Resp 18   Ht 6' (1.829 m)   Wt 289 lb 9.6 oz (131.4 kg)   BMI 39.28 kg/m  Gen:  WD/WN, NAD Head: [place]/AT, No temporalis wasting. Ear/Nose/Throat: Hearing grossly intact, nares w/o erythema or drainage Eyes: Conjunctiva clear. Sclera non-icteric Neck: Supple.  Trachea midline Pulmonary:  Good air movement, no use of accessory muscles.  Cardiac: RRR, no JVD Vascular:  Vessel Right Left  Radial Palpable Palpable               Musculoskeletal: M/S 5/5 throughout.  No deformity or atrophy. Scabs and wounds are present on the right leg with one small area on the left. 3+ RLE edema, 1-2+ LLE edema. Neurologic: Sensation grossly intact in extremities.  Symmetrical.   Speech is fluent.  Psychiatric: Judgment intact, Mood & affect appropriate for pt's clinical situation. Dermatologic: No rashes or ulcers noted.  No cellulitis or open wounds.      Labs No results found for this or any previous visit (from the past 2160 hours).  Radiology No results found.  Assessment/Plan  Lymphedema His symptoms are severe and significant and now he has worsening ulcerations on the right leg.  Were going to need to get his Unna boot wraps up higher on the right leg today.  He really needs to get back into his lymphedema pump but he says he cannot fit these anymore.  I think he may benefit from the Lymphapress pod or at least pants to get the compression up higher as he has swelling all the way up into his lower abdomen that is severe.  This has been a longstanding and severe problem for roughly 3 decades for Warren Leblanc.  Elevation and exercise are also very helpful.  Continue weekly Unna boots.  Swelling of limb Worse.  See plan above  Essential hypertension, benign blood pressure control important in reducing the progression of atherosclerotic disease. On appropriate oral medications.    Selinda Gu, MD  03/12/2023 1:29 PM    This note was created with Dragon medical transcription system.  Any errors from dictation are purely unintentional

## 2023-03-19 ENCOUNTER — Encounter (INDEPENDENT_AMBULATORY_CARE_PROVIDER_SITE_OTHER): Payer: Medicare Other

## 2023-03-19 ENCOUNTER — Encounter (INDEPENDENT_AMBULATORY_CARE_PROVIDER_SITE_OTHER): Payer: Self-pay | Admitting: Nurse Practitioner

## 2023-03-19 ENCOUNTER — Ambulatory Visit (INDEPENDENT_AMBULATORY_CARE_PROVIDER_SITE_OTHER): Payer: Medicare Other | Admitting: Nurse Practitioner

## 2023-03-19 VITALS — BP 126/86 | HR 82 | Resp 16 | Wt 293.8 lb

## 2023-03-19 DIAGNOSIS — I890 Lymphedema, not elsewhere classified: Secondary | ICD-10-CM | POA: Diagnosis not present

## 2023-03-19 NOTE — Progress Notes (Signed)
History of Present Illness  There is no documented history at this time  Assessments & Plan   There are no diagnoses linked to this encounter.    Additional instructions  Subjective:  Patient presents with venous ulcer of the Bilateral lower extremity.    Procedure:  3 layer unna wrap was placed Bilateral lower extremity.   Plan:   Follow up in one week.

## 2023-03-26 ENCOUNTER — Encounter (INDEPENDENT_AMBULATORY_CARE_PROVIDER_SITE_OTHER): Payer: Self-pay | Admitting: Nurse Practitioner

## 2023-03-26 ENCOUNTER — Ambulatory Visit (INDEPENDENT_AMBULATORY_CARE_PROVIDER_SITE_OTHER): Payer: Medicare Other | Admitting: Nurse Practitioner

## 2023-03-26 VITALS — BP 127/84 | HR 81 | Resp 16 | Wt 289.0 lb

## 2023-03-26 DIAGNOSIS — I890 Lymphedema, not elsewhere classified: Secondary | ICD-10-CM | POA: Diagnosis not present

## 2023-03-26 NOTE — Progress Notes (Signed)
 History of Present Illness  There is no documented history at this time  Assessments & Plan   There are no diagnoses linked to this encounter.    Additional instructions  Subjective:  Patient presents with venous ulcer of the Bilateral lower extremity.    Procedure:  3 layer unna wrap was placed Bilateral lower extremity.   Plan:   Follow up in one week.

## 2023-04-02 ENCOUNTER — Encounter (INDEPENDENT_AMBULATORY_CARE_PROVIDER_SITE_OTHER): Payer: Self-pay | Admitting: Nurse Practitioner

## 2023-04-02 ENCOUNTER — Ambulatory Visit (INDEPENDENT_AMBULATORY_CARE_PROVIDER_SITE_OTHER): Payer: Medicare Other | Admitting: Nurse Practitioner

## 2023-04-02 VITALS — BP 126/79 | HR 63 | Resp 16 | Wt 288.5 lb

## 2023-04-02 DIAGNOSIS — I890 Lymphedema, not elsewhere classified: Secondary | ICD-10-CM | POA: Diagnosis not present

## 2023-04-02 NOTE — Progress Notes (Signed)
 History of Present Illness  There is no documented history at this time  Assessments & Plan   There are no diagnoses linked to this encounter.    Additional instructions  Subjective:  Patient presents with venous ulcer of the Bilateral lower extremity.    Procedure:  3 layer unna wrap was placed Bilateral lower extremity.   Plan:   Follow up in one week.

## 2023-04-09 ENCOUNTER — Encounter (INDEPENDENT_AMBULATORY_CARE_PROVIDER_SITE_OTHER): Payer: Medicare Other

## 2023-04-10 ENCOUNTER — Ambulatory Visit (INDEPENDENT_AMBULATORY_CARE_PROVIDER_SITE_OTHER)

## 2023-04-10 ENCOUNTER — Encounter (INDEPENDENT_AMBULATORY_CARE_PROVIDER_SITE_OTHER): Payer: Self-pay

## 2023-04-10 VITALS — BP 126/82 | HR 72 | Resp 16 | Wt 291.0 lb

## 2023-04-10 DIAGNOSIS — I890 Lymphedema, not elsewhere classified: Secondary | ICD-10-CM

## 2023-04-10 NOTE — Progress Notes (Unsigned)
 History of Present Illness  There is no documented history at this time  Assessments & Plan   There are no diagnoses linked to this encounter.    Additional instructions  Subjective:  Patient presents with venous ulcer of the Bilateral lower extremity.    Procedure:  3 layer unna wrap was placed Bilateral lower extremity.   Plan:   Follow up in one week.

## 2023-04-16 ENCOUNTER — Ambulatory Visit (INDEPENDENT_AMBULATORY_CARE_PROVIDER_SITE_OTHER): Payer: Medicare Other | Admitting: Nurse Practitioner

## 2023-04-16 ENCOUNTER — Encounter (INDEPENDENT_AMBULATORY_CARE_PROVIDER_SITE_OTHER): Payer: Self-pay

## 2023-04-16 VITALS — BP 120/79 | HR 71 | Resp 16 | Wt 290.0 lb

## 2023-04-16 DIAGNOSIS — I890 Lymphedema, not elsewhere classified: Secondary | ICD-10-CM

## 2023-04-16 NOTE — Progress Notes (Signed)
 History of Present Illness  There is no documented history at this time  Assessments & Plan   There are no diagnoses linked to this encounter.    Additional instructions  Subjective:  Patient presents with venous ulcer of the Bilateral lower extremity.    Procedure:  3 layer unna wrap was placed Bilateral lower extremity.   Plan:   Follow up in one week.

## 2023-04-23 ENCOUNTER — Ambulatory Visit (INDEPENDENT_AMBULATORY_CARE_PROVIDER_SITE_OTHER): Payer: Medicare Other | Admitting: Nurse Practitioner

## 2023-04-23 ENCOUNTER — Encounter (INDEPENDENT_AMBULATORY_CARE_PROVIDER_SITE_OTHER): Payer: Self-pay

## 2023-04-23 VITALS — BP 133/82 | HR 68 | Resp 18 | Wt 291.6 lb

## 2023-04-23 DIAGNOSIS — I890 Lymphedema, not elsewhere classified: Secondary | ICD-10-CM

## 2023-04-23 NOTE — Progress Notes (Signed)
 History of Present Illness  There is no documented history at this time  Assessments & Plan   There are no diagnoses linked to this encounter.    Additional instructions  Subjective:  Patient presents with venous ulcer of the Bilateral lower extremity.    Procedure:  3 layer unna wrap was placed Bilateral lower extremity.   Plan:   Follow up in one week.

## 2023-04-30 ENCOUNTER — Ambulatory Visit (INDEPENDENT_AMBULATORY_CARE_PROVIDER_SITE_OTHER): Payer: Medicare Other | Admitting: Vascular Surgery

## 2023-04-30 ENCOUNTER — Encounter (INDEPENDENT_AMBULATORY_CARE_PROVIDER_SITE_OTHER): Payer: Self-pay | Admitting: Vascular Surgery

## 2023-04-30 VITALS — BP 130/80 | HR 79 | Resp 18 | Ht 72.0 in | Wt 288.6 lb

## 2023-04-30 DIAGNOSIS — M7989 Other specified soft tissue disorders: Secondary | ICD-10-CM

## 2023-04-30 DIAGNOSIS — I10 Essential (primary) hypertension: Secondary | ICD-10-CM | POA: Diagnosis not present

## 2023-04-30 DIAGNOSIS — I890 Lymphedema, not elsewhere classified: Secondary | ICD-10-CM

## 2023-04-30 NOTE — Assessment & Plan Note (Signed)
 Doing better.  The more he has used his lymphedema pumps to better his swelling.  The lymphedema pump is a big benefit for him.  The Unna boot wraps will need to continue with current, but I would like to get him out of these going forward if he continues to improve.

## 2023-04-30 NOTE — Progress Notes (Signed)
 [name] : [id number]  Warren Leblanc is a 78 y.o. (04-03-1945) male who presents with chief complaint of  Chief Complaint  Patient presents with   Follow-up    F/u 6 weeks  .  History of Present Illness: Patient returns today in follow up of lymphedema and leg swelling with longstanding Unna boot wraps to keep his swelling under reasonable control.  He is doing quite well today.  His legs actually look a lot better over the past few weeks with more regular use of his lymphedema pumps as well as the [employer] wraps.  No fevers or chills.  Skin has healed on the left and he has 1 small ulceration left on the right.  Has lost a few pounds and the pain is improved.  Current Outpatient Medications  Medication Sig Dispense Refill   Ascorbic Acid (VITAMIN C) 1000 MG tablet Take by mouth.     Biotin 2.5 MG TABS Take by mouth.     Cyanocobalamin (VITAMIN B 12 PO) Take by mouth.     folic acid (FOLVITE) 1 MG tablet Take by mouth.     lisinopril-hydrochlorothiazide (ZESTORETIC) 20-12.5 MG tablet Take 2 tablets by mouth daily.      MAGNESIUM OXIDE PO Take by mouth.     Niacinamide-Zn-Cu-Methfo-Se-Cr (NICOTINAMIDE PO) Take 500 mg by mouth in the morning and at bedtime.     Potassium 99 MG TABS Take by mouth.     TURMERIC PO Take by mouth.     UNABLE TO FIND [employer]     No current facility-administered medications for this visit.    Past Medical History:  Diagnosis Date   Hypertension     History reviewed. No pertinent surgical history.   Social History   Tobacco Use   Smoking status: Some Days   Smokeless tobacco: Never  Substance Use Topics   Alcohol use: No   Drug use: No      History reviewed. No pertinent family history.   Allergies  Allergen Reactions   Amoxicillin    Amoxicillin-Pot Clavulanate    Antihistamines, Chlorpheniramine-Type Nausea Only   Diphenhydramine Hcl     Other reaction(s): Other (See Comments) drowsy     REVIEW OF SYSTEMS (Negative unless  checked)   Constitutional: [] Weight loss  [] Fever  [] Chills Cardiac: [] Chest pain   [] Chest pressure   [] Palpitations   [] Shortness of breath when laying flat   [] Shortness of breath at rest   [] Shortness of breath with exertion. Vascular:  [x] Pain in legs with walking   [x] Pain in legs at rest   [] Pain in legs when laying flat   [] Claudication   [] Pain in feet when walking  [] Pain in feet at rest  [] Pain in feet when laying flat   [] History of DVT   [] Phlebitis   [x] Swelling in legs   [] Varicose veins   [] Non-healing ulcers Pulmonary:   [] Uses home oxygen   [] Productive cough   [] Hemoptysis   [] Wheeze  [] COPD   [] Asthma Neurologic:  [] Dizziness  [] Blackouts   [] Seizures   [] History of stroke   [] History of TIA  [] Aphasia   [] Temporary blindness   [] Dysphagia   [] Weakness or numbness in arms   [] Weakness or numbness in legs Musculoskeletal:  [x] Arthritis   [] Joint swelling   [x] Joint pain   [] Low back pain Hematologic:  [] Easy bruising  [] Easy bleeding   [] Hypercoagulable state   [] Anemic   Gastrointestinal:  [] Blood in stool   [] Vomiting blood  [] Gastroesophageal reflux/heartburn   []   Abdominal pain Genitourinary:  [] Chronic kidney disease   [] Difficult urination  [] Frequent urination  [] Burning with urination   [] Hematuria Skin:  [x] Rashes   [x] Ulcers   [x] Wounds Psychological:  [] History of anxiety   []  History of major depression  Physical Examination  BP 130/80   Pulse 79   Resp 18   Ht 6' (1.829 m)   Wt 288 lb 9.6 oz (130.9 kg)   BMI 39.14 kg/m  Gen:  WD/WN, NAD Head: /AT, No temporalis wasting. Ear/Nose/Throat: Hearing grossly intact, nares w/o erythema or drainage Eyes: Conjunctiva clear. Sclera non-icteric Neck: Supple.  Trachea midline Pulmonary:  Good air movement, no use of accessory muscles.  Cardiac: RRR, no JVD Vascular:  Vessel Right Left  Radial Palpable Palpable               Musculoskeletal: M/S 5/5 throughout.  No deformity or atrophy. 2+ BLE  edema. Neurologic: Sensation grossly intact in extremities.  Symmetrical.  Speech is fluent.  Psychiatric: Judgment intact, Mood & affect appropriate for pt's clinical situation. Dermatologic: 1 small superficial ulceration on the right anterior lower leg.     Labs No results found for this or any previous visit (from the past 2160 hours).  Radiology No results found.  Assessment/Plan  Essential hypertension, benign blood pressure control important in reducing the progression of atherosclerotic disease. On appropriate oral medications.   Swelling of limb Doing better.  The more he has used his lymphedema pumps to better his swelling.  The lymphedema pump is a big benefit for him.  The Unna boot wraps will need to continue with current, but I would like to get him out of these going forward if he continues to improve.  Lymphedema Doing better.  The more he has used his lymphedema pumps to better his swelling.  The lymphedema pump is a big benefit for him.  The Unna boot wraps will need to continue with current, but I would like to get him out of these going forward if he continues to improve.    Festus Barren, MD  04/30/2023 12:49 PM    This note was created with Dragon medical transcription system.  Any errors from dictation are purely unintentional

## 2023-04-30 NOTE — Assessment & Plan Note (Signed)
 blood pressure control important in reducing the progression of atherosclerotic disease. On appropriate oral medications.

## 2023-05-07 ENCOUNTER — Encounter (INDEPENDENT_AMBULATORY_CARE_PROVIDER_SITE_OTHER): Payer: Self-pay

## 2023-05-07 ENCOUNTER — Ambulatory Visit (INDEPENDENT_AMBULATORY_CARE_PROVIDER_SITE_OTHER): Admitting: Nurse Practitioner

## 2023-05-07 VITALS — BP 120/77 | HR 61 | Resp 16 | Wt 286.4 lb

## 2023-05-07 DIAGNOSIS — I890 Lymphedema, not elsewhere classified: Secondary | ICD-10-CM | POA: Diagnosis not present

## 2023-05-07 NOTE — Progress Notes (Signed)
 History of Present Illness  There is no documented history at this time  Assessments & Plan   There are no diagnoses linked to this encounter.    Additional instructions  Subjective:  Patient presents with venous ulcer of the Bilateral lower extremity.    Procedure:  3 layer unna wrap was placed Bilateral lower extremity.   Plan:   Follow up in one week.

## 2023-05-14 ENCOUNTER — Encounter (INDEPENDENT_AMBULATORY_CARE_PROVIDER_SITE_OTHER): Payer: Self-pay | Admitting: Nurse Practitioner

## 2023-05-14 ENCOUNTER — Ambulatory Visit (INDEPENDENT_AMBULATORY_CARE_PROVIDER_SITE_OTHER): Admitting: Nurse Practitioner

## 2023-05-14 VITALS — BP 124/76 | HR 80 | Resp 18 | Wt 288.0 lb

## 2023-05-14 DIAGNOSIS — I890 Lymphedema, not elsewhere classified: Secondary | ICD-10-CM | POA: Diagnosis not present

## 2023-05-14 NOTE — Progress Notes (Signed)
 History of Present Illness  There is no documented history at this time  Assessments & Plan   There are no diagnoses linked to this encounter.    Additional instructions  Subjective:  Patient presents with venous ulcer of the Bilateral lower extremity.    Procedure:  3 layer unna wrap was placed Bilateral lower extremity.   Plan:   Follow up in one week.

## 2023-05-21 ENCOUNTER — Encounter (INDEPENDENT_AMBULATORY_CARE_PROVIDER_SITE_OTHER)

## 2023-05-22 ENCOUNTER — Encounter (INDEPENDENT_AMBULATORY_CARE_PROVIDER_SITE_OTHER): Payer: Self-pay | Admitting: Nurse Practitioner

## 2023-05-22 ENCOUNTER — Ambulatory Visit (INDEPENDENT_AMBULATORY_CARE_PROVIDER_SITE_OTHER): Admitting: Nurse Practitioner

## 2023-05-22 VITALS — BP 126/83 | HR 65 | Resp 16 | Wt 287.2 lb

## 2023-05-22 DIAGNOSIS — I890 Lymphedema, not elsewhere classified: Secondary | ICD-10-CM

## 2023-05-22 NOTE — Progress Notes (Signed)
 History of Present Illness  There is no documented history at this time  Assessments & Plan   There are no diagnoses linked to this encounter.    Additional instructions  Subjective:  Patient presents with venous ulcer of the Bilateral lower extremity.    Procedure:  3 layer unna wrap was placed Bilateral lower extremity.   Plan:   Follow up in one week.

## 2023-05-28 ENCOUNTER — Encounter (INDEPENDENT_AMBULATORY_CARE_PROVIDER_SITE_OTHER): Payer: Self-pay

## 2023-05-28 ENCOUNTER — Ambulatory Visit (INDEPENDENT_AMBULATORY_CARE_PROVIDER_SITE_OTHER): Admitting: Nurse Practitioner

## 2023-05-28 VITALS — BP 133/86 | HR 69 | Resp 17 | Wt 287.2 lb

## 2023-05-28 DIAGNOSIS — I890 Lymphedema, not elsewhere classified: Secondary | ICD-10-CM | POA: Diagnosis not present

## 2023-05-28 NOTE — Progress Notes (Signed)
 History of Present Illness  There is no documented history at this time  Assessments & Plan   There are no diagnoses linked to this encounter.    Additional instructions  Subjective:  Patient presents with venous ulcer of the Bilateral lower extremity.    Procedure:  3 layer unna wrap was placed Bilateral lower extremity.   Plan:   Follow up in one week.

## 2023-05-30 NOTE — Addendum Note (Signed)
 Addended by: Valene Gash on: 05/30/2023 01:27 PM   Modules accepted: Level of Service

## 2023-06-04 ENCOUNTER — Ambulatory Visit (INDEPENDENT_AMBULATORY_CARE_PROVIDER_SITE_OTHER): Admitting: Nurse Practitioner

## 2023-06-04 ENCOUNTER — Encounter (INDEPENDENT_AMBULATORY_CARE_PROVIDER_SITE_OTHER): Payer: Self-pay | Admitting: Nurse Practitioner

## 2023-06-04 VITALS — BP 127/81 | HR 63 | Resp 16 | Wt 287.8 lb

## 2023-06-04 DIAGNOSIS — I890 Lymphedema, not elsewhere classified: Secondary | ICD-10-CM

## 2023-06-04 NOTE — Progress Notes (Signed)
 History of Present Illness  There is no documented history at this time  Assessments & Plan   There are no diagnoses linked to this encounter.    Additional instructions  Subjective:  Patient presents with venous ulcer of the Bilateral lower extremity.    Procedure:  3 layer unna wrap was placed Bilateral lower extremity.   Plan:   Follow up in one week.

## 2023-06-11 ENCOUNTER — Ambulatory Visit (INDEPENDENT_AMBULATORY_CARE_PROVIDER_SITE_OTHER): Admitting: Nurse Practitioner

## 2023-06-11 ENCOUNTER — Encounter (INDEPENDENT_AMBULATORY_CARE_PROVIDER_SITE_OTHER): Payer: Self-pay

## 2023-06-11 VITALS — BP 117/78 | HR 63 | Resp 16 | Wt 288.0 lb

## 2023-06-11 DIAGNOSIS — I890 Lymphedema, not elsewhere classified: Secondary | ICD-10-CM | POA: Diagnosis not present

## 2023-06-11 NOTE — Progress Notes (Signed)
 History of Present Illness  There is no documented history at this time  Assessments & Plan   There are no diagnoses linked to this encounter.    Additional instructions  Subjective:  Patient presents with venous ulcer of the Bilateral lower extremity.    Procedure:  3 layer unna wrap was placed Bilateral lower extremity.   Plan:   Follow up in one week.

## 2023-06-18 ENCOUNTER — Ambulatory Visit (INDEPENDENT_AMBULATORY_CARE_PROVIDER_SITE_OTHER): Admitting: Nurse Practitioner

## 2023-06-18 VITALS — BP 133/83 | HR 78 | Resp 17 | Ht 72.0 in | Wt 289.5 lb

## 2023-06-18 DIAGNOSIS — I890 Lymphedema, not elsewhere classified: Secondary | ICD-10-CM

## 2023-06-18 NOTE — Progress Notes (Unsigned)
 History of Present Illness  There is no documented history at this time  Assessments & Plan   There are no diagnoses linked to this encounter.    Additional instructions  Subjective:  Patient presents with venous ulcer of the Bilateral lower extremity.    Procedure:  3 layer unna wrap was placed Bilateral lower extremity.   Plan:   Follow up in one week.

## 2023-06-20 ENCOUNTER — Encounter (INDEPENDENT_AMBULATORY_CARE_PROVIDER_SITE_OTHER): Payer: Self-pay | Admitting: Nurse Practitioner

## 2023-06-25 ENCOUNTER — Encounter (INDEPENDENT_AMBULATORY_CARE_PROVIDER_SITE_OTHER): Payer: Self-pay

## 2023-06-25 ENCOUNTER — Ambulatory Visit (INDEPENDENT_AMBULATORY_CARE_PROVIDER_SITE_OTHER): Admitting: Nurse Practitioner

## 2023-06-25 ENCOUNTER — Encounter (INDEPENDENT_AMBULATORY_CARE_PROVIDER_SITE_OTHER): Payer: Self-pay | Admitting: Nurse Practitioner

## 2023-06-25 VITALS — BP 126/83 | HR 77 | Resp 16 | Wt 292.0 lb

## 2023-06-25 DIAGNOSIS — I890 Lymphedema, not elsewhere classified: Secondary | ICD-10-CM | POA: Diagnosis not present

## 2023-06-25 NOTE — Progress Notes (Signed)
 History of Present Illness  There is no documented history at this time  Assessments & Plan   There are no diagnoses linked to this encounter.    Additional instructions  Subjective:  Patient presents with venous ulcer of the Bilateral lower extremity.    Procedure:  3 layer unna wrap was placed Bilateral lower extremity.   Plan:   Follow up in one week.

## 2023-07-02 ENCOUNTER — Ambulatory Visit (INDEPENDENT_AMBULATORY_CARE_PROVIDER_SITE_OTHER): Admitting: Vascular Surgery

## 2023-07-02 VITALS — BP 138/84 | HR 75 | Resp 17 | Ht 72.0 in | Wt 292.2 lb

## 2023-07-02 DIAGNOSIS — L97212 Non-pressure chronic ulcer of right calf with fat layer exposed: Secondary | ICD-10-CM

## 2023-07-02 DIAGNOSIS — M7989 Other specified soft tissue disorders: Secondary | ICD-10-CM | POA: Diagnosis not present

## 2023-07-02 DIAGNOSIS — I890 Lymphedema, not elsewhere classified: Secondary | ICD-10-CM | POA: Diagnosis not present

## 2023-07-02 DIAGNOSIS — I10 Essential (primary) hypertension: Secondary | ICD-10-CM | POA: Diagnosis not present

## 2023-07-02 NOTE — Progress Notes (Signed)
 [name] : [id number]  Warren Leblanc is a 78 y.o. (1945-03-25) male who presents with chief complaint of  Chief Complaint  Patient presents with   Venous Insufficiency    unna fu see JD  .  History of Present Illness: Patient returns today in follow up of his lymphedema and chronic leg swelling.  2 weeks ago, he had issues with the Unna boots and had to cut these off.  This resulted in worsening swelling and blistering in both lower extremities.  He had a new Unna boot placed last week and his legs are much better today.  He does still have 2 areas of superficial ulceration 1 on each leg.  Swelling is relatively stable.  Current Outpatient Medications  Medication Sig Dispense Refill   Ascorbic Acid (VITAMIN C) 1000 MG tablet Take by mouth.     Biotin 2.5 MG TABS Take by mouth.     clobetasol (TEMOVATE) 0.05 % external solution Apply topically.     Cyanocobalamin (VITAMIN B 12 PO) Take by mouth.     folic acid (FOLVITE) 1 MG tablet Take by mouth.     ketoconazole (NIZORAL) 2 % cream Apply 1 Application topically 2 (two) times daily.     lisinopril-hydrochlorothiazide (ZESTORETIC) 20-12.5 MG tablet Take 2 tablets by mouth daily.      MAGNESIUM OXIDE PO Take by mouth.     Niacinamide-Zn-Cu-Methfo-Se-Cr (NICOTINAMIDE PO) Take 500 mg by mouth in the morning and at bedtime.     Potassium 99 MG TABS Take by mouth.     TURMERIC PO Take by mouth.     UNABLE TO FIND [employer]     No current facility-administered medications for this visit.    Past Medical History:  Diagnosis Date   Hypertension     No past surgical history on file.   Social History   Tobacco Use   Smoking status: Some Days   Smokeless tobacco: Never  Substance Use Topics   Alcohol use: No   Drug use: No       No family history on file.   Allergies  Allergen Reactions   Amoxicillin    Amoxicillin-Pot Clavulanate    Antihistamines, Chlorpheniramine-Type Nausea Only   Diphenhydramine Hcl     Other  reaction(s): Other (See Comments) drowsy      REVIEW OF SYSTEMS (Negative unless checked)   Constitutional: [] Weight loss  [] Fever  [] Chills Cardiac: [] Chest pain   [] Chest pressure   [] Palpitations   [] Shortness of breath when laying flat   [] Shortness of breath at rest   [] Shortness of breath with exertion. Vascular:  [x] Pain in legs with walking   [x] Pain in legs at rest   [] Pain in legs when laying flat   [] Claudication   [] Pain in feet when walking  [] Pain in feet at rest  [] Pain in feet when laying flat   [] History of DVT   [] Phlebitis   [x] Swelling in legs   [] Varicose veins   [] Non-healing ulcers Pulmonary:   [] Uses home oxygen   [] Productive cough   [] Hemoptysis   [] Wheeze  [] COPD   [] Asthma Neurologic:  [] Dizziness  [] Blackouts   [] Seizures   [] History of stroke   [] History of TIA  [] Aphasia   [] Temporary blindness   [] Dysphagia   [] Weakness or numbness in arms   [] Weakness or numbness in legs Musculoskeletal:  [x] Arthritis   [] Joint swelling   [x] Joint pain   [] Low back pain Hematologic:  [] Easy bruising  [] Easy bleeding   []   Hypercoagulable state   [] Anemic   Gastrointestinal:  [] Blood in stool   [] Vomiting blood  [] Gastroesophageal reflux/heartburn   [] Abdominal pain Genitourinary:  [] Chronic kidney disease   [] Difficult urination  [] Frequent urination  [] Burning with urination   [] Hematuria Skin:  [x] Rashes   [x] Ulcers   [x] Wounds Psychological:  [] History of anxiety   []  History of major depression  Physical Examination  BP 138/84 (BP Location: Right Arm, Patient Position: Sitting, Cuff Size: Large)   Pulse 75   Resp 17   Ht 6' (1.829 m)   Wt 292 lb 3.2 oz (132.5 kg)   BMI 39.63 kg/m  Gen:  WD/WN, NAD Head: [place]/AT, No temporalis wasting. Ear/Nose/Throat: Hearing grossly intact, nares w/o erythema or drainage Eyes: Conjunctiva clear. Sclera non-icteric Neck: Supple.  Trachea midline Pulmonary:  Good air movement, no use of accessory muscles.  Cardiac: RRR, no  JVD Vascular:  Vessel Right Left  Radial Palpable Palpable               Musculoskeletal: M/S 5/5 throughout.  No deformity or atrophy.  2-3+ bilateral lower extremity edema. Neurologic: Sensation grossly intact in extremities.  Symmetrical.  Speech is fluent.  Psychiatric: Judgment intact, Mood & affect appropriate for pt's clinical situation. Dermatologic: He has small superficial ulcerations on both lower extremities on the anterior portion.  On the right leg, this is about the size of a quarter.  On the left leg, this is about the size of a dime.  No erythema.  Some serous drainage but nothing purulent or foul-smelling.      Labs No results found for this or any previous visit (from the past 2160 hours).  Radiology No results found.  Assessment/Plan  Lymphedema Better this week than last week.  Still chronic severe and longstanding.  Continue to use lymphedema pumps.  Continue weekly Unna boots.  3 layer Unna boot's were placed on both lower extremities today.  Swelling of limb Better this week than last week.  Still chronic severe and longstanding.  Continue to use lymphedema pumps.  Continue weekly Unna boots.  3 layer Unna boot's were placed on both lower extremities today.  Lower limb ulcer, calf, right, with fat layer exposed (HCC) Bilateral superficial ulcerations are present.  Continue Unna boot therapy weekly.  Unna boots placed on each side today.  Essential hypertension, benign blood pressure control important in reducing the progression of atherosclerotic disease. On appropriate oral medications.    Mikki Alexander, MD  07/02/2023 2:35 PM    This note was created with Dragon medical transcription system.  Any errors from dictation are purely unintentional

## 2023-07-02 NOTE — Assessment & Plan Note (Signed)
 Better this week than last week.  Still chronic severe and longstanding.  Continue to use lymphedema pumps.  Continue weekly Unna boots.  3 layer Unna boot's were placed on both lower extremities today.

## 2023-07-02 NOTE — Assessment & Plan Note (Signed)
 Bilateral superficial ulcerations are present.  Continue Unna boot therapy weekly.  Unna boots placed on each side today.

## 2023-07-02 NOTE — Assessment & Plan Note (Signed)
 blood pressure control important in reducing the progression of atherosclerotic disease. On appropriate oral medications.

## 2023-07-04 ENCOUNTER — Ambulatory Visit (INDEPENDENT_AMBULATORY_CARE_PROVIDER_SITE_OTHER): Admitting: Vascular Surgery

## 2023-07-04 VITALS — BP 117/67 | HR 86 | Resp 17 | Ht 72.0 in | Wt 291.0 lb

## 2023-07-04 DIAGNOSIS — I83009 Varicose veins of unspecified lower extremity with ulcer of unspecified site: Secondary | ICD-10-CM | POA: Diagnosis not present

## 2023-07-04 DIAGNOSIS — L97909 Non-pressure chronic ulcer of unspecified part of unspecified lower leg with unspecified severity: Secondary | ICD-10-CM | POA: Diagnosis not present

## 2023-07-04 NOTE — Progress Notes (Unsigned)
 History of Present Illness  There is no documented history at this time  Assessments & Plan   There are no diagnoses linked to this encounter.    Additional instructions  Subjective:  Patient presents with venous ulcer of the Bilateral lower extremity.    Procedure:  3 layer unna wrap was placed Bilateral lower extremity.   Plan:   Follow up in one week. Pt came in today to have unna boots replaced the ones placed on Tuesday were sliding down.  I placed aquacel over the open area on the front of both shins then wrapped them with unna boots.

## 2023-07-05 ENCOUNTER — Encounter (INDEPENDENT_AMBULATORY_CARE_PROVIDER_SITE_OTHER): Payer: Self-pay | Admitting: Vascular Surgery

## 2023-07-05 DIAGNOSIS — I83009 Varicose veins of unspecified lower extremity with ulcer of unspecified site: Secondary | ICD-10-CM | POA: Insufficient documentation

## 2023-07-09 ENCOUNTER — Ambulatory Visit (INDEPENDENT_AMBULATORY_CARE_PROVIDER_SITE_OTHER): Admitting: Nurse Practitioner

## 2023-07-09 ENCOUNTER — Encounter (INDEPENDENT_AMBULATORY_CARE_PROVIDER_SITE_OTHER): Payer: Self-pay

## 2023-07-09 VITALS — BP 134/83 | HR 65 | Resp 18

## 2023-07-09 DIAGNOSIS — L97909 Non-pressure chronic ulcer of unspecified part of unspecified lower leg with unspecified severity: Secondary | ICD-10-CM

## 2023-07-09 DIAGNOSIS — I83009 Varicose veins of unspecified lower extremity with ulcer of unspecified site: Secondary | ICD-10-CM

## 2023-07-09 NOTE — Progress Notes (Unsigned)
 History of Present Illness  There is no documented history at this time  Assessments & Plan   There are no diagnoses linked to this encounter.    Additional instructions  Subjective:  Patient presents with venous ulcer of the Bilateral lower extremity.    Procedure:  3 layer unna wrap was placed Bilateral lower extremity.   Plan:   Follow up in one week.

## 2023-07-10 ENCOUNTER — Encounter (INDEPENDENT_AMBULATORY_CARE_PROVIDER_SITE_OTHER): Payer: Self-pay | Admitting: Nurse Practitioner

## 2023-07-16 ENCOUNTER — Ambulatory Visit (INDEPENDENT_AMBULATORY_CARE_PROVIDER_SITE_OTHER): Admitting: Nurse Practitioner

## 2023-07-16 ENCOUNTER — Encounter (INDEPENDENT_AMBULATORY_CARE_PROVIDER_SITE_OTHER): Payer: Self-pay

## 2023-07-16 VITALS — BP 121/81 | HR 75 | Resp 16 | Wt 292.4 lb

## 2023-07-16 DIAGNOSIS — I890 Lymphedema, not elsewhere classified: Secondary | ICD-10-CM | POA: Diagnosis not present

## 2023-07-16 NOTE — Progress Notes (Signed)
 History of Present Illness  There is no documented history at this time  Assessments & Plan   There are no diagnoses linked to this encounter.    Additional instructions  Subjective:  Patient presents with venous ulcer of the Bilateral lower extremity.    Procedure:  3 layer unna wrap was placed Bilateral lower extremity.   Plan:   Follow up in one week.

## 2023-07-19 ENCOUNTER — Encounter (INDEPENDENT_AMBULATORY_CARE_PROVIDER_SITE_OTHER): Payer: Self-pay | Admitting: Nurse Practitioner

## 2023-07-23 ENCOUNTER — Ambulatory Visit (INDEPENDENT_AMBULATORY_CARE_PROVIDER_SITE_OTHER): Admitting: Nurse Practitioner

## 2023-07-23 ENCOUNTER — Encounter (INDEPENDENT_AMBULATORY_CARE_PROVIDER_SITE_OTHER): Payer: Self-pay | Admitting: Nurse Practitioner

## 2023-07-23 VITALS — BP 120/76 | HR 75 | Resp 18 | Wt 290.0 lb

## 2023-07-23 DIAGNOSIS — I890 Lymphedema, not elsewhere classified: Secondary | ICD-10-CM

## 2023-07-23 NOTE — Progress Notes (Signed)
 History of Present Illness  There is no documented history at this time  Assessments & Plan   There are no diagnoses linked to this encounter.    Additional instructions  Subjective:  Patient presents with venous ulcer of the Bilateral lower extremity.    Procedure:  3 layer unna wrap was placed Bilateral lower extremity.   Plan:   Follow up in one week.

## 2023-07-30 ENCOUNTER — Encounter (INDEPENDENT_AMBULATORY_CARE_PROVIDER_SITE_OTHER): Payer: Self-pay

## 2023-07-30 ENCOUNTER — Ambulatory Visit (INDEPENDENT_AMBULATORY_CARE_PROVIDER_SITE_OTHER): Admitting: Nurse Practitioner

## 2023-07-30 VITALS — BP 128/80 | HR 69 | Resp 18 | Wt 291.0 lb

## 2023-07-30 DIAGNOSIS — I890 Lymphedema, not elsewhere classified: Secondary | ICD-10-CM

## 2023-07-30 NOTE — Progress Notes (Signed)
 History of Present Illness  There is no documented history at this time  Assessments & Plan   There are no diagnoses linked to this encounter.    Additional instructions  Subjective:  Patient presents with venous ulcer of the Bilateral lower extremity.    Procedure:  3 layer unna wrap was placed Bilateral lower extremity.   Plan:   Follow up in one week.

## 2023-08-06 ENCOUNTER — Encounter (INDEPENDENT_AMBULATORY_CARE_PROVIDER_SITE_OTHER): Payer: Self-pay | Admitting: Nurse Practitioner

## 2023-08-06 ENCOUNTER — Ambulatory Visit (INDEPENDENT_AMBULATORY_CARE_PROVIDER_SITE_OTHER): Admitting: Nurse Practitioner

## 2023-08-06 VITALS — BP 131/83 | HR 67 | Resp 18 | Wt 290.0 lb

## 2023-08-06 DIAGNOSIS — I890 Lymphedema, not elsewhere classified: Secondary | ICD-10-CM | POA: Diagnosis not present

## 2023-08-06 NOTE — Progress Notes (Signed)
 History of Present Illness  There is no documented history at this time  Assessments & Plan   There are no diagnoses linked to this encounter.    Additional instructions  Subjective:  Patient presents with venous ulcer of the Bilateral lower extremity.    Procedure:  3 layer unna wrap was placed Bilateral lower extremity.   Plan:   Follow up in one week.

## 2023-08-13 ENCOUNTER — Encounter (INDEPENDENT_AMBULATORY_CARE_PROVIDER_SITE_OTHER)

## 2023-08-16 ENCOUNTER — Encounter (INDEPENDENT_AMBULATORY_CARE_PROVIDER_SITE_OTHER): Payer: Self-pay | Admitting: Vascular Surgery

## 2023-08-16 ENCOUNTER — Ambulatory Visit (INDEPENDENT_AMBULATORY_CARE_PROVIDER_SITE_OTHER): Admitting: Vascular Surgery

## 2023-08-16 VITALS — BP 127/79 | HR 80 | Wt 291.4 lb

## 2023-08-16 DIAGNOSIS — I10 Essential (primary) hypertension: Secondary | ICD-10-CM | POA: Diagnosis not present

## 2023-08-16 DIAGNOSIS — I890 Lymphedema, not elsewhere classified: Secondary | ICD-10-CM | POA: Diagnosis not present

## 2023-08-16 DIAGNOSIS — L97212 Non-pressure chronic ulcer of right calf with fat layer exposed: Secondary | ICD-10-CM

## 2023-08-16 DIAGNOSIS — M7989 Other specified soft tissue disorders: Secondary | ICD-10-CM | POA: Diagnosis not present

## 2023-08-16 NOTE — Progress Notes (Signed)
 [name] : [id number]  Warren Leblanc is a 78 y.o. (August 15, 1945) male who presents with chief complaint of  Chief Complaint  Patient presents with   fu 6 weeks  .  History of Present Illness: Patient returns today in follow up of lymphedema and leg swelling.  He has started using his lymphedema pumps again and his swelling is better.  He is going to try to use a knee brace for swelling and problems in his knee just above his Unna boot wraps.  We have been doing weekly Unna boots for many years now.  Current Outpatient Medications  Medication Sig Dispense Refill   Ascorbic Acid (VITAMIN C) 1000 MG tablet Take by mouth.     Biotin 2.5 MG TABS Take by mouth.     clobetasol (TEMOVATE) 0.05 % external solution Apply topically.     Cyanocobalamin (VITAMIN B 12 PO) Take by mouth.     folic acid (FOLVITE) 1 MG tablet Take by mouth.     lisinopril-hydrochlorothiazide (ZESTORETIC) 20-12.5 MG tablet Take 2 tablets by mouth daily.      MAGNESIUM OXIDE PO Take by mouth.     Niacinamide-Zn-Cu-Methfo-Se-Cr (NICOTINAMIDE PO) Take 500 mg by mouth in the morning and at bedtime.     Potassium 99 MG TABS Take by mouth.     TURMERIC PO Take by mouth.     UNABLE TO FIND [employer]     No current facility-administered medications for this visit.    Past Medical History:  Diagnosis Date   Hypertension     History reviewed. No pertinent surgical history.   Social History   Tobacco Use   Smoking status: Some Days   Smokeless tobacco: Never  Substance Use Topics   Alcohol use: No   Drug use: No      History reviewed. No pertinent family history.   Allergies  Allergen Reactions   Amoxicillin    Amoxicillin-Pot Clavulanate    Antihistamines, Chlorpheniramine-Type Nausea Only   Diphenhydramine Hcl     Other reaction(s): Other (See Comments) drowsy     REVIEW OF SYSTEMS (Negative unless checked)   Constitutional: [] Weight loss  [] Fever  [] Chills Cardiac: [] Chest pain   [] Chest pressure    [] Palpitations   [] Shortness of breath when laying flat   [] Shortness of breath at rest   [] Shortness of breath with exertion. Vascular:  [x] Pain in legs with walking   [x] Pain in legs at rest   [] Pain in legs when laying flat   [] Claudication   [] Pain in feet when walking  [] Pain in feet at rest  [] Pain in feet when laying flat   [] History of DVT   [] Phlebitis   [x] Swelling in legs   [] Varicose veins   [] Non-healing ulcers Pulmonary:   [] Uses home oxygen   [] Productive cough   [] Hemoptysis   [] Wheeze  [] COPD   [] Asthma Neurologic:  [] Dizziness  [] Blackouts   [] Seizures   [] History of stroke   [] History of TIA  [] Aphasia   [] Temporary blindness   [] Dysphagia   [] Weakness or numbness in arms   [] Weakness or numbness in legs Musculoskeletal:  [x] Arthritis   [] Joint swelling   [x] Joint pain   [] Low back pain Hematologic:  [] Easy bruising  [] Easy bleeding   [] Hypercoagulable state   [] Anemic   Gastrointestinal:  [] Blood in stool   [] Vomiting blood  [] Gastroesophageal reflux/heartburn   [] Abdominal pain Genitourinary:  [] Chronic kidney disease   [] Difficult urination  [] Frequent urination  [] Burning with urination   []   Hematuria Skin:  [x] Rashes   [x] Ulcers   [x] Wounds Psychological:  [] History of anxiety   []  History of major depression  Physical Examination  BP 127/79   Pulse 80   Wt 291 lb 6 oz (132.2 kg)   BMI 39.52 kg/m  Gen:  WD/WN, NAD Head: [place]/AT, No temporalis wasting. Ear/Nose/Throat: Hearing grossly intact, nares w/o erythema or drainage Eyes: Conjunctiva clear. Sclera non-icteric Neck: Supple.  Trachea midline Pulmonary:  Good air movement, no use of accessory muscles.  Cardiac: RRR, no JVD Vascular:  Vessel Right Left  Radial Palpable Palpable       Musculoskeletal: M/S 5/5 throughout.  No deformity or atrophy. 2+ BLE edema. Neurologic: Sensation grossly intact in extremities.  Symmetrical.  Speech is fluent.  Psychiatric: Judgment intact, Mood & affect appropriate for pt's  clinical situation. Dermatologic: Small superficial ulcerations present in the mid calf area anteriorly bilaterally.  These are clean and healthy.  These are about the size of a nickel.      Labs No results found for this or any previous visit (from the past 2160 hours).  Radiology No results found.  Assessment/Plan  Lymphedema Better this week than last visit.  Still chronic severe and longstanding.  Continue to use lymphedema pumps.  Continue weekly Unna boots.  3 layer Unna boot's were placed on both lower extremities today.   Swelling of limb Better this week than last week.  Still chronic severe and longstanding.  Continue to use lymphedema pumps.  Continue weekly Unna boots.  3 layer Unna boot's were placed on both lower extremities today.   Lower limb ulcer, calf, right, with fat layer exposed (HCC) Bilateral superficial ulcerations are present.  Continue Unna boot therapy weekly.  Unna boots placed on each side today.   Essential hypertension, benign blood pressure control important in reducing the progression of atherosclerotic disease. On appropriate oral medications.   Warren Gu, MD  08/16/2023 11:12 AM    This note was created with Dragon medical transcription system.  Any errors from dictation are purely unintentional

## 2023-08-20 ENCOUNTER — Encounter (INDEPENDENT_AMBULATORY_CARE_PROVIDER_SITE_OTHER): Payer: Self-pay

## 2023-08-20 ENCOUNTER — Ambulatory Visit (INDEPENDENT_AMBULATORY_CARE_PROVIDER_SITE_OTHER)

## 2023-08-20 VITALS — BP 129/81 | HR 78 | Resp 18 | Ht 72.0 in | Wt 289.0 lb

## 2023-08-20 DIAGNOSIS — I890 Lymphedema, not elsewhere classified: Secondary | ICD-10-CM

## 2023-08-20 NOTE — Progress Notes (Signed)
 History of Present Illness  There is no documented history at this time  Assessments & Plan   There are no diagnoses linked to this encounter.    Additional instructions  Subjective:  Patient presents with venous ulcer of the Bilateral lower extremity.    Procedure:  3 layer unna wrap was placed Bilateral lower extremity.   Plan:   Follow up in one week.

## 2023-08-20 NOTE — Patient Instructions (Signed)
unna

## 2023-08-27 ENCOUNTER — Encounter (INDEPENDENT_AMBULATORY_CARE_PROVIDER_SITE_OTHER): Payer: Self-pay | Admitting: Nurse Practitioner

## 2023-08-27 ENCOUNTER — Ambulatory Visit (INDEPENDENT_AMBULATORY_CARE_PROVIDER_SITE_OTHER)

## 2023-08-27 DIAGNOSIS — I890 Lymphedema, not elsewhere classified: Secondary | ICD-10-CM

## 2023-08-27 NOTE — Progress Notes (Signed)
 History of Present Illness  There is no documented history at this time  Assessments & Plan   There are no diagnoses linked to this encounter.    Additional instructions  Subjective:  Patient presents with venous ulcer of the Bilateral lower extremity.    Procedure:  3 layer unna wrap was placed Bilateral lower extremity.   Plan:   Follow up in one week.

## 2023-09-03 ENCOUNTER — Encounter (INDEPENDENT_AMBULATORY_CARE_PROVIDER_SITE_OTHER)

## 2023-09-04 ENCOUNTER — Ambulatory Visit (INDEPENDENT_AMBULATORY_CARE_PROVIDER_SITE_OTHER): Admitting: Nurse Practitioner

## 2023-09-04 ENCOUNTER — Encounter (INDEPENDENT_AMBULATORY_CARE_PROVIDER_SITE_OTHER): Payer: Self-pay

## 2023-09-04 VITALS — BP 131/83 | HR 69 | Resp 18 | Wt 285.4 lb

## 2023-09-04 DIAGNOSIS — I890 Lymphedema, not elsewhere classified: Secondary | ICD-10-CM

## 2023-09-04 NOTE — Progress Notes (Signed)
 History of Present Illness  There is no documented history at this time  Assessments & Plan   There are no diagnoses linked to this encounter.    Additional instructions  Subjective:  Patient presents with venous ulcer of the Bilateral lower extremity.    Procedure:  3 layer unna wrap was placed Bilateral lower extremity.   Plan:   Follow up in one week.

## 2023-09-09 ENCOUNTER — Encounter (INDEPENDENT_AMBULATORY_CARE_PROVIDER_SITE_OTHER): Payer: Self-pay | Admitting: Nurse Practitioner

## 2023-09-10 ENCOUNTER — Ambulatory Visit (INDEPENDENT_AMBULATORY_CARE_PROVIDER_SITE_OTHER): Admitting: Nurse Practitioner

## 2023-09-10 ENCOUNTER — Encounter (INDEPENDENT_AMBULATORY_CARE_PROVIDER_SITE_OTHER): Payer: Self-pay

## 2023-09-10 VITALS — BP 121/80 | HR 66 | Resp 16 | Ht 72.0 in | Wt 287.6 lb

## 2023-09-10 DIAGNOSIS — I890 Lymphedema, not elsewhere classified: Secondary | ICD-10-CM | POA: Diagnosis not present

## 2023-09-10 NOTE — Progress Notes (Signed)
 History of Present Illness  There is no documented history at this time  Assessments & Plan   There are no diagnoses linked to this encounter.    Additional instructions  Subjective:  Patient presents with venous ulcer of the Bilateral lower extremity.    Procedure:  3 layer unna wrap was placed Bilateral lower extremity.   Plan:   Follow up in one week.

## 2023-09-17 ENCOUNTER — Ambulatory Visit (INDEPENDENT_AMBULATORY_CARE_PROVIDER_SITE_OTHER): Admitting: Nurse Practitioner

## 2023-09-17 ENCOUNTER — Ambulatory Visit (INDEPENDENT_AMBULATORY_CARE_PROVIDER_SITE_OTHER): Admitting: Vascular Surgery

## 2023-09-17 ENCOUNTER — Encounter (INDEPENDENT_AMBULATORY_CARE_PROVIDER_SITE_OTHER): Payer: Self-pay

## 2023-09-17 VITALS — BP 123/80 | HR 70 | Resp 16 | Ht 72.0 in | Wt 287.0 lb

## 2023-09-17 DIAGNOSIS — I890 Lymphedema, not elsewhere classified: Secondary | ICD-10-CM

## 2023-09-17 NOTE — Progress Notes (Signed)
 History of Present Illness  There is no documented history at this time  Assessments & Plan   There are no diagnoses linked to this encounter.    Additional instructions  Subjective:  Patient presents with venous ulcer of the Bilateral lower extremity.    Procedure:  3 layer unna wrap was placed Bilateral lower extremity.   Plan:   Follow up in one week.

## 2023-09-24 ENCOUNTER — Ambulatory Visit (INDEPENDENT_AMBULATORY_CARE_PROVIDER_SITE_OTHER): Admitting: Vascular Surgery

## 2023-09-24 ENCOUNTER — Encounter (INDEPENDENT_AMBULATORY_CARE_PROVIDER_SITE_OTHER): Payer: Self-pay | Admitting: Vascular Surgery

## 2023-09-24 VITALS — BP 147/73 | HR 80 | Ht 72.0 in | Wt 287.0 lb

## 2023-09-24 DIAGNOSIS — L97212 Non-pressure chronic ulcer of right calf with fat layer exposed: Secondary | ICD-10-CM | POA: Diagnosis not present

## 2023-09-24 DIAGNOSIS — M7989 Other specified soft tissue disorders: Secondary | ICD-10-CM

## 2023-09-24 DIAGNOSIS — I10 Essential (primary) hypertension: Secondary | ICD-10-CM | POA: Diagnosis not present

## 2023-09-24 DIAGNOSIS — I890 Lymphedema, not elsewhere classified: Secondary | ICD-10-CM

## 2023-09-24 NOTE — Progress Notes (Signed)
 [name] : [id number]  Warren Leblanc is a 78 y.o. (March 01, 1945) male who presents with chief complaint of No chief complaint on file. SABRA  History of Present Illness: Patient returns today in follow up of his leg swelling and lymphedema. He is doing well.  No fever or chills or signs of infection.  His legs remain prominently swollen.  He still uses his lymphedema pump regularly.  We had him in Unna boots for many years now.  Current Outpatient Medications  Medication Sig Dispense Refill   Ascorbic Acid (VITAMIN C) 1000 MG tablet Take by mouth.     Biotin 2.5 MG TABS Take by mouth.     clobetasol (TEMOVATE) 0.05 % external solution Apply topically.     Cyanocobalamin (VITAMIN B 12 PO) Take by mouth.     folic acid (FOLVITE) 1 MG tablet Take by mouth.     lisinopril-hydrochlorothiazide (ZESTORETIC) 20-12.5 MG tablet Take 2 tablets by mouth daily.      MAGNESIUM OXIDE PO Take by mouth.     Niacinamide-Zn-Cu-Methfo-Se-Cr (NICOTINAMIDE PO) Take 500 mg by mouth in the morning and at bedtime.     Potassium 99 MG TABS Take by mouth.     TURMERIC PO Take by mouth.     UNABLE TO FIND [employer]     No current facility-administered medications for this visit.    Past Medical History:  Diagnosis Date   Hypertension     History reviewed. No pertinent surgical history.   Social History   Tobacco Use   Smoking status: Some Days   Smokeless tobacco: Never  Substance Use Topics   Alcohol use: No   Drug use: No      History reviewed. No pertinent family history.   Allergies  Allergen Reactions   Amoxicillin    Amoxicillin-Pot Clavulanate    Antihistamines, Chlorpheniramine-Type Nausea Only   Diphenhydramine Hcl     Other reaction(s): Other (See Comments) drowsy     REVIEW OF SYSTEMS (Negative unless checked)   Constitutional: [] Weight loss  [] Fever  [] Chills Cardiac: [] Chest pain   [] Chest pressure   [] Palpitations   [] Shortness of breath when laying flat   [] Shortness of  breath at rest   [] Shortness of breath with exertion. Vascular:  [x] Pain in legs with walking   [x] Pain in legs at rest   [] Pain in legs when laying flat   [] Claudication   [] Pain in feet when walking  [] Pain in feet at rest  [] Pain in feet when laying flat   [] History of DVT   [] Phlebitis   [x] Swelling in legs   [] Varicose veins   [] Non-healing ulcers Pulmonary:   [] Uses home oxygen   [] Productive cough   [] Hemoptysis   [] Wheeze  [] COPD   [] Asthma Neurologic:  [] Dizziness  [] Blackouts   [] Seizures   [] History of stroke   [] History of TIA  [] Aphasia   [] Temporary blindness   [] Dysphagia   [] Weakness or numbness in arms   [] Weakness or numbness in legs Musculoskeletal:  [x] Arthritis   [] Joint swelling   [x] Joint pain   [] Low back pain Hematologic:  [] Easy bruising  [] Easy bleeding   [] Hypercoagulable state   [] Anemic   Gastrointestinal:  [] Blood in stool   [] Vomiting blood  [] Gastroesophageal reflux/heartburn   [] Abdominal pain Genitourinary:  [] Chronic kidney disease   [] Difficult urination  [] Frequent urination  [] Burning with urination   [] Hematuria Skin:  [x] Rashes   [x] Ulcers   [x] Wounds Psychological:  [] History of anxiety   []  History  of major depression  Physical Examination  BP (!) 147/73   Pulse 80   Ht 6' (1.829 m)   Wt 287 lb (130.2 kg)   BMI 38.92 kg/m  Gen:  WD/WN, NAD Head: [place]/AT, No temporalis wasting. Ear/Nose/Throat: Hearing grossly intact, nares w/o erythema or drainage Eyes: Conjunctiva clear. Sclera non-icteric Neck: Supple.  Trachea midline Pulmonary:  Good air movement, no use of accessory muscles.  Cardiac: RRR, no JVD Vascular:  Vessel Right Left  Radial Palpable Palpable               Musculoskeletal: M/S 5/5 throughout.  No deformity or atrophy. 1-2+ BLE edema. Neurologic: Sensation grossly intact in extremities.  Symmetrical.  Speech is fluent.  Psychiatric: Judgment intact, Mood & affect appropriate for pt's clinical situation. Dermatologic: small  superficial ulcerations about the size a nickel on each side.      Labs No results found for this or any previous visit (from the past 2160 hours).  Radiology No results found.  Assessment/Plan  Lymphedema Slightly better this week than last visit.  Still chronic severe and longstanding.  Continue to use lymphedema pumps.  Continue weekly Unna boots.  3 layer Unna boot's were placed on both lower extremities today.   Swelling of limb Better this week than last week.  Still chronic severe and longstanding.  Continue to use lymphedema pumps.  Continue weekly Unna boots.  3 layer Unna boot's were placed on both lower extremities today.   Lower limb ulcer, calf, right, with fat layer exposed (HCC) Bilateral superficial ulcerations are present.  Continue Unna boot therapy weekly.  Unna boots placed on each side today.   Essential hypertension, benign blood pressure control important in reducing the progression of atherosclerotic disease. On appropriate oral medications.   Selinda Gu, MD  09/24/2023 11:28 AM    This note was created with Dragon medical transcription system.  Any errors from dictation are purely unintentional

## 2023-10-01 ENCOUNTER — Encounter (INDEPENDENT_AMBULATORY_CARE_PROVIDER_SITE_OTHER): Payer: Self-pay | Admitting: Nurse Practitioner

## 2023-10-01 ENCOUNTER — Ambulatory Visit (INDEPENDENT_AMBULATORY_CARE_PROVIDER_SITE_OTHER): Admitting: Nurse Practitioner

## 2023-10-01 VITALS — BP 130/80 | HR 68 | Resp 18 | Wt 288.6 lb

## 2023-10-01 DIAGNOSIS — I890 Lymphedema, not elsewhere classified: Secondary | ICD-10-CM | POA: Diagnosis not present

## 2023-10-01 NOTE — Progress Notes (Signed)
 History of Present Illness  There is no documented history at this time  Assessments & Plan   There are no diagnoses linked to this encounter.    Additional instructions  Subjective:  Patient presents with venous ulcer of the Bilateral lower extremity.    Procedure:  3 layer unna wrap was placed Bilateral lower extremity.   Plan:   Follow up in one week.

## 2023-10-08 ENCOUNTER — Ambulatory Visit (INDEPENDENT_AMBULATORY_CARE_PROVIDER_SITE_OTHER): Admitting: Nurse Practitioner

## 2023-10-08 ENCOUNTER — Encounter (INDEPENDENT_AMBULATORY_CARE_PROVIDER_SITE_OTHER): Payer: Self-pay

## 2023-10-08 VITALS — BP 127/85 | HR 80 | Resp 18 | Wt 288.0 lb

## 2023-10-08 DIAGNOSIS — I890 Lymphedema, not elsewhere classified: Secondary | ICD-10-CM | POA: Diagnosis not present

## 2023-10-08 NOTE — Progress Notes (Signed)
 History of Present Illness  There is no documented history at this time  Assessments & Plan   There are no diagnoses linked to this encounter.    Additional instructions  Subjective:  Patient presents with venous ulcer of the Bilateral lower extremity.    Procedure:  3 layer unna wrap was placed Bilateral lower extremity.   Plan:   Follow up in one week.

## 2023-10-15 ENCOUNTER — Ambulatory Visit (INDEPENDENT_AMBULATORY_CARE_PROVIDER_SITE_OTHER): Admitting: Nurse Practitioner

## 2023-10-15 ENCOUNTER — Encounter (INDEPENDENT_AMBULATORY_CARE_PROVIDER_SITE_OTHER): Payer: Self-pay

## 2023-10-15 VITALS — BP 133/82 | HR 68 | Resp 18 | Wt 285.0 lb

## 2023-10-15 DIAGNOSIS — I890 Lymphedema, not elsewhere classified: Secondary | ICD-10-CM | POA: Diagnosis not present

## 2023-10-15 NOTE — Progress Notes (Signed)
 History of Present Illness  There is no documented history at this time  Assessments & Plan   There are no diagnoses linked to this encounter.    Additional instructions  Subjective:  Patient presents with venous ulcer of the Bilateral lower extremity.    Procedure:  3 layer unna wrap was placed Bilateral lower extremity.   Plan:   Follow up in one week.

## 2023-10-22 ENCOUNTER — Ambulatory Visit (INDEPENDENT_AMBULATORY_CARE_PROVIDER_SITE_OTHER): Admitting: Nurse Practitioner

## 2023-10-22 ENCOUNTER — Encounter (INDEPENDENT_AMBULATORY_CARE_PROVIDER_SITE_OTHER): Payer: Self-pay

## 2023-10-22 VITALS — BP 137/77 | HR 47 | Resp 17 | Ht 72.0 in | Wt 282.5 lb

## 2023-10-22 DIAGNOSIS — I890 Lymphedema, not elsewhere classified: Secondary | ICD-10-CM

## 2023-10-22 NOTE — Progress Notes (Signed)
 History of Present Illness  There is no documented history at this time  Assessments & Plan   There are no diagnoses linked to this encounter.    Additional instructions  Subjective:  Patient presents with venous ulcer of the Bilateral lower extremity.    Procedure:  3 layer unna wrap was placed Bilateral lower extremity.   Plan:   Follow up in one week.    Dois Seip CMA

## 2023-10-27 ENCOUNTER — Encounter (INDEPENDENT_AMBULATORY_CARE_PROVIDER_SITE_OTHER): Payer: Self-pay | Admitting: Nurse Practitioner

## 2023-10-29 ENCOUNTER — Ambulatory Visit (INDEPENDENT_AMBULATORY_CARE_PROVIDER_SITE_OTHER): Admitting: Nurse Practitioner

## 2023-10-29 ENCOUNTER — Encounter (INDEPENDENT_AMBULATORY_CARE_PROVIDER_SITE_OTHER): Payer: Self-pay

## 2023-10-29 VITALS — BP 134/74 | HR 58 | Ht 72.0 in | Wt 280.6 lb

## 2023-10-29 DIAGNOSIS — I890 Lymphedema, not elsewhere classified: Secondary | ICD-10-CM | POA: Diagnosis not present

## 2023-10-29 NOTE — Progress Notes (Signed)
 History of Present Illness  There is no documented history at this time  Assessments & Plan   There are no diagnoses linked to this encounter.    Additional instructions  Subjective:  Patient presents with venous ulcer of the Bilateral lower extremity.    Procedure:  3 layer unna wrap was placed Bilateral lower extremity.   Plan:     Pt presented for zinc bilateral unna boots, tolerated with no issues. Patient to follow up in one week.    Dois Seip CMA

## 2023-11-05 ENCOUNTER — Ambulatory Visit (INDEPENDENT_AMBULATORY_CARE_PROVIDER_SITE_OTHER): Admitting: Vascular Surgery

## 2023-11-05 ENCOUNTER — Encounter (INDEPENDENT_AMBULATORY_CARE_PROVIDER_SITE_OTHER): Payer: Self-pay | Admitting: Vascular Surgery

## 2023-11-05 VITALS — BP 129/85 | HR 91 | Resp 18 | Ht 72.0 in | Wt 281.0 lb

## 2023-11-05 DIAGNOSIS — I10 Essential (primary) hypertension: Secondary | ICD-10-CM | POA: Diagnosis not present

## 2023-11-05 DIAGNOSIS — M7989 Other specified soft tissue disorders: Secondary | ICD-10-CM

## 2023-11-05 DIAGNOSIS — I890 Lymphedema, not elsewhere classified: Secondary | ICD-10-CM

## 2023-11-05 DIAGNOSIS — L97212 Non-pressure chronic ulcer of right calf with fat layer exposed: Secondary | ICD-10-CM

## 2023-11-05 NOTE — Progress Notes (Signed)
 [name] : [id number]  Warren Leblanc is a 78 y.o. (1945-09-01) male who presents with chief complaint of  Chief Complaint  Patient presents with   Follow-up    Follow up unna/lymphedema   .  History of Present Illness: Patient returns today in follow up of his leg swelling and lymphedema.  He is doing well.  His Unna boots have rolled down some and has had swelling up into his knee and thigh areas.  He is had more mobilization of the fluid and firmness in his groin and lower abdominal area.  Overall he is doing fairly well.  Current Outpatient Medications  Medication Sig Dispense Refill   Ascorbic Acid (VITAMIN C) 1000 MG tablet Take by mouth.     Biotin 2.5 MG TABS Take by mouth.     clobetasol (TEMOVATE) 0.05 % external solution Apply topically.     Cyanocobalamin (VITAMIN B 12 PO) Take by mouth.     folic acid (FOLVITE) 1 MG tablet Take by mouth.     lisinopril-hydrochlorothiazide (ZESTORETIC) 20-12.5 MG tablet Take 2 tablets by mouth daily.      MAGNESIUM OXIDE PO Take by mouth.     Niacinamide-Zn-Cu-Methfo-Se-Cr (NICOTINAMIDE PO) Take 500 mg by mouth in the morning and at bedtime.     Potassium 99 MG TABS Take by mouth.     TURMERIC PO Take by mouth.     UNABLE TO FIND Carn[employer]     No current facility-administered medications for this visit.    Past Medical History:  Diagnosis Date   Hypertension     History reviewed. No pertinent surgical history.   Social History   Tobacco Use   Smoking status: Some Days   Smokeless tobacco: Never  Substance Use Topics   Alcohol use: No   Drug use: No       History reviewed. No pertinent family history.   Allergies  Allergen Reactions   Amoxicillin    Amoxicillin-Pot Clavulanate    Antihistamines, Chlorpheniramine-Type Nausea Only   Diphenhydramine Hcl     Other reaction(s): Other (See Comments) drowsy      REVIEW OF SYSTEMS (Negative unless checked)   Constitutional: [] Weight loss  [] Fever  [] Chills Cardiac:  [] Chest pain   [] Chest pressure   [] Palpitations   [] Shortness of breath when laying flat   [] Shortness of breath at rest   [] Shortness of breath with exertion. Vascular:  [x] Pain in legs with walking   [x] Pain in legs at rest   [] Pain in legs when laying flat   [] Claudication   [] Pain in feet when walking  [] Pain in feet at rest  [] Pain in feet when laying flat   [] History of DVT   [] Phlebitis   [x] Swelling in legs   [] Varicose veins   [] Non-healing ulcers Pulmonary:   [] Uses home oxygen   [] Productive cough   [] Hemoptysis   [] Wheeze  [] COPD   [] Asthma Neurologic:  [] Dizziness  [] Blackouts   [] Seizures   [] History of stroke   [] History of TIA  [] Aphasia   [] Temporary blindness   [] Dysphagia   [] Weakness or numbness in arms   [] Weakness or numbness in legs Musculoskeletal:  [x] Arthritis   [] Joint swelling   [x] Joint pain   [] Low back pain Hematologic:  [] Easy bruising  [] Easy bleeding   [] Hypercoagulable state   [] Anemic   Gastrointestinal:  [] Blood in stool   [] Vomiting blood  [] Gastroesophageal reflux/heartburn   [] Abdominal pain Genitourinary:  [] Chronic kidney disease   [] Difficult urination  [] Frequent  urination  [] Burning with urination   [] Hematuria Skin:  [x] Rashes   [x] Ulcers   [x] Wounds Psychological:  [] History of anxiety   []  History of major depression  Physical Examination  BP 129/85 (BP Location: Right Arm, Patient Position: Sitting, Cuff Size: Large)   Pulse 91   Resp 18   Ht 6' (1.829 m)   Wt 281 lb (127.5 kg)   BMI 38.11 kg/m  Gen:  WD/WN, NAD Head: [place]/AT, No temporalis wasting. Ear/Nose/Throat: Hearing grossly intact, nares w/o erythema or drainage Eyes: Conjunctiva clear. Sclera non-icteric Neck: Supple.  Trachea midline Pulmonary:  Good air movement, no use of accessory muscles.  Cardiac: RRR, no JVD Vascular:  Vessel Right Left  Radial Palpable Palpable                   Musculoskeletal: M/S 5/5 throughout.  No deformity or atrophy.  1-2+ bilateral lower  extremity edema. Neurologic: Sensation grossly intact in extremities.  Symmetrical.  Speech is fluent.  Psychiatric: Judgment intact, Mood & affect appropriate for pt's clinical situation. Dermatologic: Small superficial ulceration about the size of a dime on the right leg.  Dry scaling skin is present bilaterally.      Labs No results found for this or any previous visit (from the past 2160 hours).  Radiology No results found.  Assessment/Plan  Lymphedema Stable.  Still chronic severe and longstanding.  Continue to use lymphedema pumps.  Continue weekly Unna boots.  3 layer Unna boot's were placed on both lower extremities today.   Swelling of limb Stable.  Still chronic severe and longstanding.  Continue to use lymphedema pumps.  Continue weekly Unna boots.  3 layer Unna boot's were placed on both lower extremities today.   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 Continue Unna boot therapy weekly.  Unna boots placed on each side today.   Essential hypertension, benign blood pressure control important in reducing the progression of atherosclerotic disease. On appropriate oral medications.   Selinda Gu, MD  11/05/2023 11:32 AM    This note was created with Dragon medical transcription system.  Any errors from dictation are purely unintentional

## 2023-11-12 ENCOUNTER — Ambulatory Visit (INDEPENDENT_AMBULATORY_CARE_PROVIDER_SITE_OTHER): Admitting: Nurse Practitioner

## 2023-11-12 ENCOUNTER — Encounter (INDEPENDENT_AMBULATORY_CARE_PROVIDER_SITE_OTHER): Payer: Self-pay

## 2023-11-12 VITALS — BP 121/81 | HR 92 | Resp 18 | Ht 72.0 in | Wt 280.2 lb

## 2023-11-12 DIAGNOSIS — I890 Lymphedema, not elsewhere classified: Secondary | ICD-10-CM | POA: Diagnosis not present

## 2023-11-12 NOTE — Progress Notes (Signed)
 History of Present Illness  There is no documented history at this time  Assessments & Plan   There are no diagnoses linked to this encounter.    Additional instructions  Subjective:  Patient presents with venous ulcer of the Bilateral lower extremity.    Procedure:  3 layer unna wrap was placed Bilateral lower extremity.  Zinc bilateral unna wraps applied    Plan:   Follow up in one week.

## 2023-11-19 ENCOUNTER — Ambulatory Visit (INDEPENDENT_AMBULATORY_CARE_PROVIDER_SITE_OTHER): Admitting: Nurse Practitioner

## 2023-11-19 ENCOUNTER — Encounter (INDEPENDENT_AMBULATORY_CARE_PROVIDER_SITE_OTHER): Payer: Self-pay

## 2023-11-19 VITALS — BP 118/83 | HR 70 | Resp 18 | Ht 72.0 in | Wt 275.4 lb

## 2023-11-19 DIAGNOSIS — I890 Lymphedema, not elsewhere classified: Secondary | ICD-10-CM

## 2023-11-19 NOTE — Progress Notes (Signed)
 History of Present Illness  There is no documented history at this time  Assessments & Plan   There are no diagnoses linked to this encounter.    Additional instructions  Subjective:  Patient presents with venous ulcer of the Bilateral lower extremity.    Procedure:  3 layer unna wrap was placed Bilateral lower extremity.   Plan:   Follow up in one week.

## 2023-11-26 ENCOUNTER — Ambulatory Visit (INDEPENDENT_AMBULATORY_CARE_PROVIDER_SITE_OTHER): Admitting: Nurse Practitioner

## 2023-11-26 ENCOUNTER — Encounter (INDEPENDENT_AMBULATORY_CARE_PROVIDER_SITE_OTHER): Payer: Self-pay | Admitting: Nurse Practitioner

## 2023-11-26 ENCOUNTER — Encounter (INDEPENDENT_AMBULATORY_CARE_PROVIDER_SITE_OTHER): Payer: Self-pay

## 2023-11-26 VITALS — BP 128/85 | HR 85 | Resp 18 | Wt 276.0 lb

## 2023-11-26 DIAGNOSIS — I890 Lymphedema, not elsewhere classified: Secondary | ICD-10-CM | POA: Diagnosis not present

## 2023-11-26 NOTE — Progress Notes (Signed)
 History of Present Illness  There is no documented history at this time  Assessments & Plan   There are no diagnoses linked to this encounter.    Additional instructions  Subjective:  Patient presents with venous ulcer of the Bilateral lower extremity.    Procedure:  3 layer unna wrap was placed Bilateral lower extremity.   Plan:   Follow up in one week.

## 2023-12-03 ENCOUNTER — Ambulatory Visit (INDEPENDENT_AMBULATORY_CARE_PROVIDER_SITE_OTHER): Admitting: Nurse Practitioner

## 2023-12-03 ENCOUNTER — Encounter (INDEPENDENT_AMBULATORY_CARE_PROVIDER_SITE_OTHER): Payer: Self-pay

## 2023-12-03 VITALS — BP 116/82 | HR 80 | Resp 18 | Ht 72.0 in | Wt 275.0 lb

## 2023-12-03 DIAGNOSIS — I890 Lymphedema, not elsewhere classified: Secondary | ICD-10-CM | POA: Diagnosis not present

## 2023-12-03 NOTE — Progress Notes (Signed)
 History of Present Illness  There is no documented history at this time  Assessments & Plan   There are no diagnoses linked to this encounter.    Additional instructions  Subjective:  Patient presents with venous ulcer of the Bilateral lower extremity.    Procedure:  3 layer unna wrap was placed Bilateral lower extremity.   Plan:   Follow up in one week.

## 2023-12-08 ENCOUNTER — Encounter (INDEPENDENT_AMBULATORY_CARE_PROVIDER_SITE_OTHER): Payer: Self-pay | Admitting: Nurse Practitioner

## 2023-12-10 ENCOUNTER — Ambulatory Visit (INDEPENDENT_AMBULATORY_CARE_PROVIDER_SITE_OTHER): Admitting: Nurse Practitioner

## 2023-12-10 VITALS — BP 124/83 | HR 73 | Resp 18 | Ht 72.0 in | Wt 274.8 lb

## 2023-12-10 DIAGNOSIS — I890 Lymphedema, not elsewhere classified: Secondary | ICD-10-CM | POA: Diagnosis not present

## 2023-12-10 NOTE — Progress Notes (Signed)
 History of Present Illness  There is no documented history at this time  Assessments & Plan   There are no diagnoses linked to this encounter.    Additional instructions  Subjective:  Patient presents with venous ulcer of the Bilateral lower extremity.    Procedure:  3 layer unna wrap was placed Bilateral lower extremity.   Plan:   Follow up in one week.

## 2023-12-16 ENCOUNTER — Encounter (INDEPENDENT_AMBULATORY_CARE_PROVIDER_SITE_OTHER): Payer: Self-pay | Admitting: Nurse Practitioner

## 2023-12-17 ENCOUNTER — Ambulatory Visit (INDEPENDENT_AMBULATORY_CARE_PROVIDER_SITE_OTHER): Admitting: Vascular Surgery

## 2023-12-17 VITALS — BP 132/82 | HR 83 | Resp 18 | Ht 72.0 in | Wt 275.0 lb

## 2023-12-17 DIAGNOSIS — L97212 Non-pressure chronic ulcer of right calf with fat layer exposed: Secondary | ICD-10-CM

## 2023-12-17 DIAGNOSIS — M7989 Other specified soft tissue disorders: Secondary | ICD-10-CM

## 2023-12-17 DIAGNOSIS — I890 Lymphedema, not elsewhere classified: Secondary | ICD-10-CM | POA: Diagnosis not present

## 2023-12-17 DIAGNOSIS — I10 Essential (primary) hypertension: Secondary | ICD-10-CM | POA: Diagnosis not present

## 2023-12-17 NOTE — Progress Notes (Signed)
 [name] : [id number]  Warren Leblanc is a 78 y.o. (1945/02/11) male who presents with chief complaint of  Chief Complaint  Patient presents with   Follow-up    Unna follow up    .  History of Present Illness: Patient returns today in follow up of his longstanding lymphedema and leg swelling.  He has been in weekly Foot locker wraps now for many years.  His legs actually look smaller and better today in the lower part of the leg and I have seen them in some time.  He does still have a lot of dry scaling skin and a few small superficial wounds on each leg.  His swelling has predominantly topped out in the knee and thigh area but this is relatively soft and moving along well.  He is actually seeing progress and pleased with this.  Current Outpatient Medications  Medication Sig Dispense Refill   Ascorbic Acid (VITAMIN C) 1000 MG tablet Take by mouth.     Biotin 2.5 MG TABS Take by mouth.     clobetasol (TEMOVATE) 0.05 % external solution Apply topically.     Cyanocobalamin (VITAMIN B 12 PO) Take by mouth.     folic acid (FOLVITE) 1 MG tablet Take by mouth.     lisinopril-hydrochlorothiazide (ZESTORETIC) 20-12.5 MG tablet Take 2 tablets by mouth daily.      MAGNESIUM OXIDE PO Take by mouth.     Niacinamide-Zn-Cu-Methfo-Se-Cr (NICOTINAMIDE PO) Take 500 mg by mouth in the morning and at bedtime.     Potassium 99 MG TABS Take by mouth.     TURMERIC PO Take by mouth.     UNABLE TO FIND Carn[employer]     No current facility-administered medications for this visit.    Past Medical History:  Diagnosis Date   Hypertension     No past surgical history on file.   Social History   Tobacco Use   Smoking status: Some Days   Smokeless tobacco: Never  Substance Use Topics   Alcohol use: No   Drug use: No       No family history on file.   Allergies  Allergen Reactions   Amoxicillin    Amoxicillin-Pot Clavulanate    Antihistamines, Chlorpheniramine-Type Nausea Only   Diphenhydramine  Hcl     Other reaction(s): Other (See Comments) drowsy      REVIEW OF SYSTEMS (Negative unless checked)   Constitutional: [] Weight loss  [] Fever  [] Chills Cardiac: [] Chest pain   [] Chest pressure   [] Palpitations   [] Shortness of breath when laying flat   [] Shortness of breath at rest   [] Shortness of breath with exertion. Vascular:  [x] Pain in legs with walking   [x] Pain in legs at rest   [] Pain in legs when laying flat   [] Claudication   [] Pain in feet when walking  [] Pain in feet at rest  [] Pain in feet when laying flat   [] History of DVT   [] Phlebitis   [x] Swelling in legs   [] Varicose veins   [] Non-healing ulcers Pulmonary:   [] Uses home oxygen   [] Productive cough   [] Hemoptysis   [] Wheeze  [] COPD   [] Asthma Neurologic:  [] Dizziness  [] Blackouts   [] Seizures   [] History of stroke   [] History of TIA  [] Aphasia   [] Temporary blindness   [] Dysphagia   [] Weakness or numbness in arms   [] Weakness or numbness in legs Musculoskeletal:  [x] Arthritis   [] Joint swelling   [x] Joint pain   [] Low back pain Hematologic:  []   Easy bruising  [] Easy bleeding   [] Hypercoagulable state   [] Anemic   Gastrointestinal:  [] Blood in stool   [] Vomiting blood  [] Gastroesophageal reflux/heartburn   [] Abdominal pain Genitourinary:  [] Chronic kidney disease   [] Difficult urination  [] Frequent urination  [] Burning with urination   [] Hematuria Skin:  [x] Rashes   [x] Ulcers   [x] Wounds Psychological:  [] History of anxiety   []  History of major depression  Physical Examination  BP 132/82   Pulse 83   Resp 18   Ht 6' (1.829 m)   Wt 275 lb (124.7 kg)   BMI 37.30 kg/m  Gen:  WD/WN, NAD Head: [place]/AT, No temporalis wasting. Ear/Nose/Throat: Hearing grossly intact, nares w/o erythema or drainage Eyes: Conjunctiva clear. Sclera non-icteric Neck: Supple.  Trachea midline Pulmonary:  Good air movement, no use of accessory muscles.  Cardiac: RRR, no JVD Vascular:  Vessel Right Left  Radial Palpable Palpable            Musculoskeletal: M/S 5/5 throughout.  The lower part of his legs are down to mild edema on each side.  He does have still fairly prominent swelling in both knee and lower thigh areas. Neurologic: Sensation grossly intact in extremities.  Symmetrical.  Speech is fluent.  Psychiatric: Judgment intact, Mood & affect appropriate for pt's clinical situation. Dermatologic: A couple of small superficial ulcerations are present in both lower legs.  Large amount of dry, scaling pink skin is present in both lower legs.      Labs No results found for this or any previous visit (from the past 2160 hours).  Radiology No results found.  Assessment/Plan  Lymphedema Stable and improved somewhat.  Still chronic severe and longstanding.  Continue to use lymphedema pumps.  Continue weekly Unna boots.  3 layer Unna boot's were placed on both lower extremities today.   Swelling of limb Stable and improved.  Still chronic severe and longstanding.  Continue to use lymphedema pumps.  Continue weekly Unna boots.  3 layer Unna boot's were placed on both lower extremities today.   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 Continue Unna boot therapy weekly.  Unna boots placed on each side today.   Essential hypertension, benign blood pressure control important in reducing the progression of atherosclerotic disease. On appropriate oral medications.   Selinda Gu, MD  12/17/2023 11:51 AM    This note was created with Dragon medical transcription system.  Any errors from dictation are purely unintentional

## 2023-12-24 ENCOUNTER — Encounter (INDEPENDENT_AMBULATORY_CARE_PROVIDER_SITE_OTHER): Payer: Self-pay | Admitting: Nurse Practitioner

## 2023-12-24 ENCOUNTER — Ambulatory Visit (INDEPENDENT_AMBULATORY_CARE_PROVIDER_SITE_OTHER): Admitting: Nurse Practitioner

## 2023-12-24 VITALS — BP 123/84 | HR 87 | Resp 18 | Wt 275.0 lb

## 2023-12-24 DIAGNOSIS — L97212 Non-pressure chronic ulcer of right calf with fat layer exposed: Secondary | ICD-10-CM

## 2023-12-24 NOTE — Progress Notes (Signed)
 History of Present Illness  There is no documented history at this time  Assessments & Plan   There are no diagnoses linked to this encounter.    Additional instructions  Subjective:  Patient presents with venous ulcer of the Bilateral lower extremity.    Procedure:  3 layer unna wrap was placed Bilateral lower extremity.   Plan:   Follow up in one week.

## 2023-12-31 ENCOUNTER — Encounter (INDEPENDENT_AMBULATORY_CARE_PROVIDER_SITE_OTHER)

## 2024-01-01 ENCOUNTER — Encounter (INDEPENDENT_AMBULATORY_CARE_PROVIDER_SITE_OTHER): Payer: Self-pay

## 2024-01-01 ENCOUNTER — Ambulatory Visit (INDEPENDENT_AMBULATORY_CARE_PROVIDER_SITE_OTHER): Admitting: Nurse Practitioner

## 2024-01-01 VITALS — BP 120/82 | HR 73 | Resp 18 | Wt 268.0 lb

## 2024-01-01 DIAGNOSIS — L97212 Non-pressure chronic ulcer of right calf with fat layer exposed: Secondary | ICD-10-CM

## 2024-01-01 NOTE — Progress Notes (Signed)
 History of Present Illness  There is no documented history at this time  Assessments & Plan   There are no diagnoses linked to this encounter.    Additional instructions  Subjective:  Patient presents with venous ulcer of the Bilateral lower extremity.    Procedure:  3 layer unna wrap was placed Bilateral lower extremity.   Plan:   Follow up in one week.

## 2024-01-06 ENCOUNTER — Encounter (INDEPENDENT_AMBULATORY_CARE_PROVIDER_SITE_OTHER): Payer: Self-pay | Admitting: Nurse Practitioner

## 2024-01-07 ENCOUNTER — Ambulatory Visit (INDEPENDENT_AMBULATORY_CARE_PROVIDER_SITE_OTHER): Admitting: Nurse Practitioner

## 2024-01-07 ENCOUNTER — Encounter (INDEPENDENT_AMBULATORY_CARE_PROVIDER_SITE_OTHER): Payer: Self-pay

## 2024-01-07 VITALS — BP 123/84 | HR 88 | Wt 266.8 lb

## 2024-01-07 DIAGNOSIS — L97212 Non-pressure chronic ulcer of right calf with fat layer exposed: Secondary | ICD-10-CM

## 2024-01-12 ENCOUNTER — Encounter (INDEPENDENT_AMBULATORY_CARE_PROVIDER_SITE_OTHER): Payer: Self-pay | Admitting: Nurse Practitioner

## 2024-01-14 ENCOUNTER — Encounter (INDEPENDENT_AMBULATORY_CARE_PROVIDER_SITE_OTHER)

## 2024-01-15 ENCOUNTER — Ambulatory Visit (INDEPENDENT_AMBULATORY_CARE_PROVIDER_SITE_OTHER)

## 2024-01-15 ENCOUNTER — Encounter (INDEPENDENT_AMBULATORY_CARE_PROVIDER_SITE_OTHER): Payer: Self-pay

## 2024-01-15 VITALS — BP 122/78 | HR 71 | Resp 18 | Wt 264.0 lb

## 2024-01-15 DIAGNOSIS — L97212 Non-pressure chronic ulcer of right calf with fat layer exposed: Secondary | ICD-10-CM

## 2024-01-15 NOTE — Progress Notes (Signed)
 History of Present Illness  There is no documented history at this time  Assessments & Plan   There are no diagnoses linked to this encounter.    Additional instructions  Subjective:  Patient presents with venous ulcer of the Bilateral lower extremity.    Procedure:  3 layer unna wrap was placed Bilateral lower extremity.   Plan:   Follow up in one week.

## 2024-01-20 ENCOUNTER — Encounter (INDEPENDENT_AMBULATORY_CARE_PROVIDER_SITE_OTHER): Payer: Self-pay | Admitting: Nurse Practitioner

## 2024-01-21 ENCOUNTER — Ambulatory Visit (INDEPENDENT_AMBULATORY_CARE_PROVIDER_SITE_OTHER): Admitting: Vascular Surgery

## 2024-01-21 ENCOUNTER — Encounter (INDEPENDENT_AMBULATORY_CARE_PROVIDER_SITE_OTHER): Payer: Self-pay | Admitting: Vascular Surgery

## 2024-01-21 VITALS — BP 124/84 | HR 95 | Resp 17 | Ht 72.0 in | Wt 266.8 lb

## 2024-01-21 DIAGNOSIS — L97212 Non-pressure chronic ulcer of right calf with fat layer exposed: Secondary | ICD-10-CM | POA: Diagnosis not present

## 2024-01-21 DIAGNOSIS — I890 Lymphedema, not elsewhere classified: Secondary | ICD-10-CM | POA: Diagnosis not present

## 2024-01-21 DIAGNOSIS — I10 Essential (primary) hypertension: Secondary | ICD-10-CM | POA: Diagnosis not present

## 2024-01-21 DIAGNOSIS — M7989 Other specified soft tissue disorders: Secondary | ICD-10-CM

## 2024-01-21 NOTE — Progress Notes (Signed)
 [name] : [id number]  Warren Leblanc is a 78 y.o. (1945-04-12) male who presents with chief complaint of  Chief Complaint  Patient presents with   Follow-up    fu unna   .  History of Present Illness: Patient returns today in follow up of his longstanding lymphedema and leg swelling.  The Unna boots have done a good job of keeping his lower leg swelling under good control over the past several months.  He has been getting weekly Unna boot changes for many years now.  He has some superficial skin ulcerations that are mild and slowly improving.  No fevers or chills.  No major changes since his last visit.  History of Present Illness     Results   Current Outpatient Medications  Medication Sig Dispense Refill   Ascorbic Acid (VITAMIN C) 1000 MG tablet Take by mouth.     Biotin 2.5 MG TABS Take by mouth.     clobetasol (TEMOVATE) 0.05 % external solution Apply topically.     Cyanocobalamin (VITAMIN B 12 PO) Take by mouth.     folic acid (FOLVITE) 1 MG tablet Take by mouth.     lisinopril-hydrochlorothiazide (ZESTORETIC) 20-12.5 MG tablet Take 2 tablets by mouth daily.      MAGNESIUM OXIDE PO Take by mouth.     Niacinamide-Zn-Cu-Methfo-Se-Cr (NICOTINAMIDE PO) Take 500 mg by mouth in the morning and at bedtime.     Potassium 99 MG TABS Take by mouth.     TURMERIC PO Take by mouth.     UNABLE TO FIND [employer]     No current facility-administered medications for this visit.    Past Medical History:  Diagnosis Date   Hypertension     No past surgical history on file.   Social History[1]    No family history on file.   Allergies[2]   REVIEW OF SYSTEMS (Negative unless checked)   Constitutional: [] Weight loss  [] Fever  [] Chills Cardiac: [] Chest pain   [] Chest pressure   [] Palpitations   [] Shortness of breath when laying flat   [] Shortness of breath at rest   [] Shortness of breath with exertion. Vascular:  [x] Pain in legs with walking   [x] Pain in legs at rest   [] Pain  in legs when laying flat   [] Claudication   [] Pain in feet when walking  [] Pain in feet at rest  [] Pain in feet when laying flat   [] History of DVT   [] Phlebitis   [x] Swelling in legs   [] Varicose veins   [] Non-healing ulcers Pulmonary:   [] Uses home oxygen   [] Productive cough   [] Hemoptysis   [] Wheeze  [] COPD   [] Asthma Neurologic:  [] Dizziness  [] Blackouts   [] Seizures   [] History of stroke   [] History of TIA  [] Aphasia   [] Temporary blindness   [] Dysphagia   [] Weakness or numbness in arms   [] Weakness or numbness in legs Musculoskeletal:  [x] Arthritis   [] Joint swelling   [x] Joint pain   [] Low back pain Hematologic:  [] Easy bruising  [] Easy bleeding   [] Hypercoagulable state   [] Anemic   Gastrointestinal:  [] Blood in stool   [] Vomiting blood  [] Gastroesophageal reflux/heartburn   [] Abdominal pain Genitourinary:  [] Chronic kidney disease   [] Difficult urination  [] Frequent urination  [] Burning with urination   [] Hematuria Skin:  [x] Rashes   [x] Ulcers   [x] Wounds Psychological:  [] History of anxiety   []  History of major depression  Physical Examination  BP 124/84   Pulse 95   Resp 17  Ht 6' (1.829 m)   Wt 266 lb 12.8 oz (121 kg)   BMI 36.18 kg/m  Gen:  WD/WN, NAD Head: [place]/AT, No temporalis wasting. Ear/Nose/Throat: Hearing grossly intact, nares w/o erythema or drainage Eyes: Conjunctiva clear. Sclera non-icteric Neck: Supple.  Trachea midline Pulmonary:  Good air movement, no use of accessory muscles.  Cardiac: RRR, no JVD Vascular:  Vessel Right Left  Radial Palpable Palpable       Musculoskeletal: M/S 5/5 throughout.  No deformity or atrophy. 1+ BLE edema. Neurologic: Sensation grossly intact in extremities.  Symmetrical.  Speech is fluent.  Psychiatric: Judgment intact, Mood & affect appropriate for pt's clinical situation. Dermatologic: Small superficial ulceration on the right anterior lower leg and the left lateral lower leg.  These are small, clean, and superficial.   Moderate stasis dermatitis changes are present bilaterally.  A large amount of dry scaly skin is present.  Physical Exam     Labs No results found for this or any previous visit (from the past 2160 hours).  Radiology No results found.  Assessment/Plan Assessment & Plan   Lymphedema Stable and improved somewhat.  Still chronic severe and longstanding.  Continue to use lymphedema pumps.  Continue weekly Unna boots.  3 layer Unna boot's were placed on both lower extremities today.   Swelling of limb Stable and improved.  Still chronic severe and longstanding.  Continue to use lymphedema pumps.  Continue weekly Unna boots.  3 layer Unna boot's were placed on both lower extremities today.   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 Continue Unna boot therapy weekly.  Unna boots placed on each side today.   Essential hypertension, benign blood pressure control important in reducing the progression of atherosclerotic disease. On appropriate oral medications.     Selinda Gu, MD  01/21/2024 12:35 PM    This note was created with Dragon medical transcription system.  Any errors from dictation are purely unintentional    [1]  Social History Tobacco Use   Smoking status: Some Days   Smokeless tobacco: Never  Substance Use Topics   Alcohol use: No   Drug use: No  [2]  Allergies Allergen Reactions   Amoxicillin    Amoxicillin-Pot Clavulanate    Antihistamines, Chlorpheniramine-Type Nausea Only   Diphenhydramine Hcl     Other reaction(s): Other (See Comments) drowsy

## 2024-01-28 ENCOUNTER — Ambulatory Visit (INDEPENDENT_AMBULATORY_CARE_PROVIDER_SITE_OTHER): Admitting: Nurse Practitioner

## 2024-01-28 ENCOUNTER — Encounter (INDEPENDENT_AMBULATORY_CARE_PROVIDER_SITE_OTHER): Payer: Self-pay

## 2024-01-28 VITALS — BP 111/78 | HR 76 | Resp 18 | Ht 72.0 in | Wt 262.0 lb

## 2024-01-28 DIAGNOSIS — I890 Lymphedema, not elsewhere classified: Secondary | ICD-10-CM | POA: Diagnosis not present

## 2024-01-28 NOTE — Progress Notes (Signed)
 History of Present Illness  There is no documented history at this time  Assessments & Plan   There are no diagnoses linked to this encounter.    Additional instructions  Subjective:  Patient presents with venous ulcer of the Bilateral lower extremity.    Procedure:  3 layer unna wrap was placed Bilateral lower extremity.   Plan:   Follow up in one week.

## 2024-02-04 ENCOUNTER — Ambulatory Visit (INDEPENDENT_AMBULATORY_CARE_PROVIDER_SITE_OTHER): Admitting: Nurse Practitioner

## 2024-02-04 ENCOUNTER — Encounter (INDEPENDENT_AMBULATORY_CARE_PROVIDER_SITE_OTHER): Payer: Self-pay

## 2024-02-04 VITALS — BP 123/82 | HR 78 | Resp 17 | Ht 72.0 in | Wt 262.8 lb

## 2024-02-04 DIAGNOSIS — I890 Lymphedema, not elsewhere classified: Secondary | ICD-10-CM | POA: Diagnosis not present

## 2024-02-04 NOTE — Progress Notes (Signed)
 History of Present Illness  There is no documented history at this time  Assessments & Plan   There are no diagnoses linked to this encounter.    Additional instructions  Subjective:  Patient presents with venous ulcer of the Bilateral lower extremity.    Procedure:  3 layer unna wrap was placed Bilateral lower extremity.   Plan:   Follow up in one week.

## 2024-02-09 ENCOUNTER — Encounter (INDEPENDENT_AMBULATORY_CARE_PROVIDER_SITE_OTHER): Payer: Self-pay | Admitting: Nurse Practitioner

## 2024-02-09 NOTE — Progress Notes (Signed)
 History of Present Illness  There is no documented history at this time  Assessments & Plan   There are no diagnoses linked to this encounter.    Additional instructions  Subjective:  Patient presents with venous ulcer of the Bilateral lower extremity.    Procedure:  3 layer unna wrap was placed Bilateral lower extremity.   Plan:   Follow up in one week.

## 2024-02-11 ENCOUNTER — Ambulatory Visit (INDEPENDENT_AMBULATORY_CARE_PROVIDER_SITE_OTHER): Admitting: Nurse Practitioner

## 2024-02-11 ENCOUNTER — Encounter (INDEPENDENT_AMBULATORY_CARE_PROVIDER_SITE_OTHER): Payer: Self-pay

## 2024-02-11 VITALS — BP 117/79 | HR 74 | Resp 18 | Wt 263.0 lb

## 2024-02-11 DIAGNOSIS — I890 Lymphedema, not elsewhere classified: Secondary | ICD-10-CM

## 2024-02-11 NOTE — Progress Notes (Signed)
 History of Present Illness  There is no documented history at this time  Assessments & Plan   There are no diagnoses linked to this encounter.    Additional instructions  Subjective:  Patient presents with venous ulcer of the Bilateral lower extremity.    Procedure:  3 layer unna wrap was placed Bilateral lower extremity.   Plan:   Follow up in one week.

## 2024-02-16 ENCOUNTER — Encounter (INDEPENDENT_AMBULATORY_CARE_PROVIDER_SITE_OTHER): Payer: Self-pay | Admitting: Nurse Practitioner

## 2024-02-18 ENCOUNTER — Encounter (INDEPENDENT_AMBULATORY_CARE_PROVIDER_SITE_OTHER): Payer: Self-pay

## 2024-02-18 ENCOUNTER — Ambulatory Visit (INDEPENDENT_AMBULATORY_CARE_PROVIDER_SITE_OTHER): Admitting: Nurse Practitioner

## 2024-02-18 VITALS — BP 108/74 | HR 90 | Resp 18 | Wt 264.0 lb

## 2024-02-18 DIAGNOSIS — I890 Lymphedema, not elsewhere classified: Secondary | ICD-10-CM | POA: Diagnosis not present

## 2024-02-18 NOTE — Progress Notes (Signed)
 History of Present Illness  There is no documented history at this time  Assessments & Plan   There are no diagnoses linked to this encounter.    Additional instructions  Subjective:  Patient presents with venous ulcer of the Bilateral lower extremity.    Procedure:  3 layer unna wrap was placed Bilateral lower extremity.   Plan:   Follow up in one week.

## 2024-02-25 ENCOUNTER — Ambulatory Visit (INDEPENDENT_AMBULATORY_CARE_PROVIDER_SITE_OTHER): Admitting: Nurse Practitioner

## 2024-02-25 DIAGNOSIS — I890 Lymphedema, not elsewhere classified: Secondary | ICD-10-CM

## 2024-02-25 NOTE — Progress Notes (Signed)
 History of Present Illness  There is no documented history at this time  Assessments & Plan   There are no diagnoses linked to this encounter.    Additional instructions  Subjective:  Patient presents with venous ulcer of the Bilateral lower extremity.    Procedure:  3 layer unna wrap was placed Bilateral lower extremity.  Patient reports he has been having sharp shooting sensations in both legs at night that keep him up at night. He reports this has been happening for some time but this past week has been nightly. He is wondering if something can be done for this. Advise him I would forward for more information for him.    Plan:   Follow up in one week.

## 2024-02-29 ENCOUNTER — Encounter (INDEPENDENT_AMBULATORY_CARE_PROVIDER_SITE_OTHER): Payer: Self-pay | Admitting: Nurse Practitioner

## 2024-03-03 ENCOUNTER — Ambulatory Visit (INDEPENDENT_AMBULATORY_CARE_PROVIDER_SITE_OTHER): Admitting: Vascular Surgery

## 2024-03-03 ENCOUNTER — Encounter (INDEPENDENT_AMBULATORY_CARE_PROVIDER_SITE_OTHER): Payer: Self-pay | Admitting: Vascular Surgery

## 2024-03-03 VITALS — BP 117/72 | HR 76 | Resp 17 | Ht 72.0 in | Wt 267.8 lb

## 2024-03-03 DIAGNOSIS — I890 Lymphedema, not elsewhere classified: Secondary | ICD-10-CM | POA: Diagnosis not present

## 2024-03-03 DIAGNOSIS — M7989 Other specified soft tissue disorders: Secondary | ICD-10-CM

## 2024-03-03 DIAGNOSIS — L97212 Non-pressure chronic ulcer of right calf with fat layer exposed: Secondary | ICD-10-CM

## 2024-03-03 DIAGNOSIS — I10 Essential (primary) hypertension: Secondary | ICD-10-CM | POA: Diagnosis not present
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
## 2024-03-03 NOTE — Progress Notes (Signed)
 History of Present Illness  There is no documented history at this time  Assessments & Plan   Patient presents for 3 layer zinc unna. Patient reports no concerns this week with neuropathy pains No questions or concerns at this time     Additional instructions  Subjective:  Patient presents with venous ulcer of the Bilateral lower extremity.    Procedure:  3 layer unna wrap was placed Bilateral lower extremity.   Plan:   Follow up in one week.   Dois Seip CMA

## 2024-03-10 ENCOUNTER — Encounter (INDEPENDENT_AMBULATORY_CARE_PROVIDER_SITE_OTHER): Payer: Self-pay

## 2024-03-10 ENCOUNTER — Ambulatory Visit (INDEPENDENT_AMBULATORY_CARE_PROVIDER_SITE_OTHER): Admitting: Nurse Practitioner

## 2024-03-10 DIAGNOSIS — L97212 Non-pressure chronic ulcer of right calf with fat layer exposed: Secondary | ICD-10-CM | POA: Diagnosis not present

## 2024-03-17 ENCOUNTER — Encounter (INDEPENDENT_AMBULATORY_CARE_PROVIDER_SITE_OTHER)

## 2024-03-24 ENCOUNTER — Encounter (INDEPENDENT_AMBULATORY_CARE_PROVIDER_SITE_OTHER)

## 2024-03-31 ENCOUNTER — Encounter (INDEPENDENT_AMBULATORY_CARE_PROVIDER_SITE_OTHER)

## 2024-04-07 ENCOUNTER — Encounter (INDEPENDENT_AMBULATORY_CARE_PROVIDER_SITE_OTHER)

## 2024-04-14 ENCOUNTER — Encounter (INDEPENDENT_AMBULATORY_CARE_PROVIDER_SITE_OTHER)

## 2024-04-21 ENCOUNTER — Ambulatory Visit (INDEPENDENT_AMBULATORY_CARE_PROVIDER_SITE_OTHER): Admitting: Vascular Surgery
